# Patient Record
Sex: Female | Born: 1980
Health system: Southern US, Community
[De-identification: ages and names within clinical notes are randomized; demographics above are authoritative.]

## PROBLEM LIST (undated history)

## (undated) DIAGNOSIS — M4810 Ankylosing hyperostosis [Forestier], site unspecified: Secondary | ICD-10-CM

## (undated) DIAGNOSIS — Q828 Other specified congenital malformations of skin: Secondary | ICD-10-CM

## (undated) DIAGNOSIS — O24419 Gestational diabetes mellitus in pregnancy, unspecified control: Secondary | ICD-10-CM

## (undated) DIAGNOSIS — F419 Anxiety disorder, unspecified: Secondary | ICD-10-CM

## (undated) DIAGNOSIS — R5600 Simple febrile convulsions: Secondary | ICD-10-CM

## (undated) DIAGNOSIS — F32A Depression, unspecified: Secondary | ICD-10-CM

## (undated) DIAGNOSIS — N83209 Unspecified ovarian cyst, unspecified side: Secondary | ICD-10-CM

## (undated) DIAGNOSIS — B999 Unspecified infectious disease: Secondary | ICD-10-CM

## (undated) HISTORY — PX: WISDOM TOOTH EXTRACTION: SHX21

## (undated) HISTORY — PX: CHOLECYSTECTOMY: SHX55

## (undated) HISTORY — PX: DILATION AND CURETTAGE OF UTERUS: SHX78

## (undated) HISTORY — PX: OVARIAN CYST SURGERY: SHX726

## (undated) HISTORY — PX: TONSILLECTOMY: SUR1361

## (undated) HISTORY — DX: Depression, unspecified: F32.A

## (undated) HISTORY — PX: APPENDECTOMY: SHX54

---

## 2005-08-01 HISTORY — PX: TONSILLECTOMY: SUR1361

## 2008-09-16 DIAGNOSIS — F32A Depression, unspecified: Secondary | ICD-10-CM | POA: Insufficient documentation

## 2019-02-18 ENCOUNTER — Other Ambulatory Visit: Payer: Self-pay

## 2019-02-18 DIAGNOSIS — Z20822 Contact with and (suspected) exposure to covid-19: Secondary | ICD-10-CM

## 2019-02-18 NOTE — Addendum Note (Signed)
Addended by: Drucilla Schmidt on: 02/18/2019 08:30 AM   Modules accepted: Orders

## 2019-02-21 LAB — NOVEL CORONAVIRUS, NAA: SARS-CoV-2, NAA: NOT DETECTED

## 2019-02-26 ENCOUNTER — Other Ambulatory Visit: Payer: Self-pay

## 2019-02-26 DIAGNOSIS — Z20822 Contact with and (suspected) exposure to covid-19: Secondary | ICD-10-CM

## 2019-02-28 LAB — NOVEL CORONAVIRUS, NAA: SARS-CoV-2, NAA: NOT DETECTED

## 2019-07-24 ENCOUNTER — Encounter (HOSPITAL_COMMUNITY): Payer: Self-pay

## 2019-07-24 ENCOUNTER — Emergency Department (HOSPITAL_COMMUNITY): Payer: 59

## 2019-07-24 ENCOUNTER — Emergency Department (HOSPITAL_COMMUNITY)
Admission: EM | Admit: 2019-07-24 | Discharge: 2019-07-24 | Disposition: A | Discharge status: Home / Self Care | Payer: 59 | Attending: Emergency Medicine | Admitting: Emergency Medicine

## 2019-07-24 ENCOUNTER — Other Ambulatory Visit: Payer: Self-pay

## 2019-07-24 DIAGNOSIS — R102 Pelvic and perineal pain: Secondary | ICD-10-CM | POA: Insufficient documentation

## 2019-07-24 DIAGNOSIS — R1032 Left lower quadrant pain: Secondary | ICD-10-CM | POA: Insufficient documentation

## 2019-07-24 DIAGNOSIS — O99891 Other specified diseases and conditions complicating pregnancy: Secondary | ICD-10-CM | POA: Insufficient documentation

## 2019-07-24 DIAGNOSIS — Z3A00 Weeks of gestation of pregnancy not specified: Secondary | ICD-10-CM | POA: Diagnosis not present

## 2019-07-24 DIAGNOSIS — Z3A01 Less than 8 weeks gestation of pregnancy: Secondary | ICD-10-CM | POA: Diagnosis not present

## 2019-07-24 DIAGNOSIS — O26899 Other specified pregnancy related conditions, unspecified trimester: Secondary | ICD-10-CM | POA: Diagnosis not present

## 2019-07-24 DIAGNOSIS — O26891 Other specified pregnancy related conditions, first trimester: Secondary | ICD-10-CM | POA: Diagnosis not present

## 2019-07-24 LAB — CBC WITH DIFFERENTIAL/PLATELET
Abs Immature Granulocytes: 0.02 10*3/uL (ref 0.00–0.07)
Basophils Absolute: 0 10*3/uL (ref 0.0–0.1)
Basophils Relative: 1 %
Eosinophils Absolute: 0.3 10*3/uL (ref 0.0–0.5)
Eosinophils Relative: 4 %
HCT: 42.8 % (ref 36.0–46.0)
Hemoglobin: 14 g/dL (ref 12.0–15.0)
Immature Granulocytes: 0 %
Lymphocytes Relative: 24 %
Lymphs Abs: 1.6 10*3/uL (ref 0.7–4.0)
MCH: 30.2 pg (ref 26.0–34.0)
MCHC: 32.7 g/dL (ref 30.0–36.0)
MCV: 92.4 fL (ref 80.0–100.0)
Monocytes Absolute: 0.6 10*3/uL (ref 0.1–1.0)
Monocytes Relative: 9 %
Neutro Abs: 4.3 10*3/uL (ref 1.7–7.7)
Neutrophils Relative %: 62 %
Platelets: 243 10*3/uL (ref 150–400)
RBC: 4.63 MIL/uL (ref 3.87–5.11)
RDW: 12.1 % (ref 11.5–15.5)
WBC: 6.8 10*3/uL (ref 4.0–10.5)
nRBC: 0 % (ref 0.0–0.2)

## 2019-07-24 LAB — BASIC METABOLIC PANEL
Anion gap: 8 (ref 5–15)
BUN: 11 mg/dL (ref 6–20)
CO2: 23 mmol/L (ref 22–32)
Calcium: 8.8 mg/dL — ABNORMAL LOW (ref 8.9–10.3)
Chloride: 104 mmol/L (ref 98–111)
Creatinine, Ser: 0.76 mg/dL (ref 0.44–1.00)
GFR calc Af Amer: >60 mL/min (ref >60)
GFR calc non Af Amer: >60 mL/min (ref >60)
Glucose, Bld: 90 mg/dL (ref 70–99)
Potassium: 3.7 mmol/L (ref 3.5–5.1)
Sodium: 135 mmol/L (ref 135–145)

## 2019-07-24 LAB — ABO/RH: ABO/RH(D): O POS

## 2019-07-24 LAB — POC URINE PREG, ED: Preg Test, Ur: POSITIVE — AB

## 2019-07-24 LAB — HCG, QUANTITATIVE, PREGNANCY: hCG, Beta Chain, Quant, S: 105 m[IU]/mL — ABNORMAL HIGH (ref <5)

## 2019-07-24 NOTE — ED Provider Notes (Signed)
[facility] DEPT Provider Note   CSN: [id number] Arrival date & time: 07/24/19  [id number]     History Chief Complaint  Patient presents with  . Abdominal Cramping    Erin Dennis is a 38 y.o. female.  Erin Dennis is a 38 y.o. female with history of previous tubal pregnancy, who presents to the ED for evaluation of left lower abdominal cramping.  She reports symptoms started during the night and woke her up from sleep.  She reports that for a while they were constant and felt like a severe menstrual cramp.  She reports now cramping seems to have eased off, she called the [facility] nurse triage line and they recommended she come to the MAU ED for ultrasound given her history of tubal pregnancy.  She states that Sunday she had some light spotting, she has not had any bleeding since then, and yesterday took 2 home pregnancy test that were both positive.  She has not had any nausea or vomiting, no upper abdominal pain.  No abnormal bowel movements.  No urinary symptoms.  She has had no additional episodes of bleeding since light spotting on Sunday, last menstrual cycle was approximately 4 weeks ago.   The history is provided by the patient.       History reviewed. No pertinent past medical history.  There are no problems to display for this patient.      OB History   No obstetric history on file.     History reviewed. No pertinent family history.  Social History   Tobacco Use  . Smoking status: Not on file  Substance Use Topics  . Alcohol use: Not on file  . Drug use: Not on file    Home Medications Prior to Admission medications   Not on File    Allergies    Patient has no known allergies.  Review of Systems   Review of Systems  Constitutional: Negative for chills and fever.  Respiratory: Negative for cough and shortness of breath.   Cardiovascular: Negative for chest pain.  Gastrointestinal: Positive for abdominal pain. Negative for  diarrhea, nausea and vomiting.  Genitourinary: Positive for pelvic pain. Negative for dysuria, flank pain, frequency, vaginal bleeding and vaginal discharge.  Musculoskeletal: Negative for arthralgias.  All other systems reviewed and are negative.   Physical Exam Updated Vital Signs BP 124/88 (BP Location: Left Arm)   Pulse 68   Temp 98.2 F (36.8 C) (Oral)   Resp 16   Ht 5\' 1"  (1.549 m)   Wt 64.4 kg   SpO2 100%   BMI 26.83 kg/m   Physical Exam Vitals and nursing note reviewed.  Constitutional:      General: She is not in acute distress.    Appearance: Normal appearance. She is well-developed and normal weight. She is not ill-appearing or diaphoretic.  HENT:     Head: Normocephalic and atraumatic.  Eyes:     General:        Right eye: No discharge.        Left eye: No discharge.  Cardiovascular:     Rate and Rhythm: Normal rate and regular rhythm.     Heart sounds: Normal heart sounds. No murmur. No friction rub. No gallop.   Pulmonary:     Effort: Pulmonary effort is normal. No respiratory distress.     Breath sounds: Normal breath sounds. No wheezing or rales.     Comments: Respirations equal and unlabored, patient able to speak in full sentences,  lungs clear to auscultation bilaterally Abdominal:     General: Bowel sounds are normal. There is no distension.     Palpations: Abdomen is soft. There is no mass.     Tenderness: There is no abdominal tenderness. There is no guarding.     Comments: Abdomen soft, nondistended, nontender to palpation in all quadrants without guarding or peritoneal signs  Musculoskeletal:        General: No deformity.     Cervical back: Neck supple.  Skin:    General: Skin is warm and dry.     Capillary Refill: Capillary refill takes less than 2 seconds.  Neurological:     Mental Status: She is alert and oriented to person, place, and time.     Coordination: Coordination normal.     Comments: Speech is clear, able to follow commands Moves  extremities without ataxia, coordination intact  Psychiatric:        Mood and Affect: Mood normal.        Behavior: Behavior normal.     ED Results / Procedures / Treatments   Labs (all labs ordered are listed, but only abnormal results are displayed) Labs Reviewed  BASIC METABOLIC PANEL - Abnormal; Notable for the following components:      Result Value   Calcium 8.8 (*)    All other components within normal limits  HCG, QUANTITATIVE, PREGNANCY - Abnormal; Notable for the following components:   hCG, Beta Chain, Quant, S 105 (*)    All other components within normal limits  POC URINE PREG, ED - Abnormal; Notable for the following components:   Preg Test, Ur POSITIVE (*)    All other components within normal limits  CBC WITH DIFFERENTIAL/PLATELET  ABO/RH    EKG None  Radiology US OB LESS THAN 14 WEEKS WITH OB TRANSVAGINAL  Result Date: 07/24/2019 CLINICAL DATA:  Pelvic pain with positive beta HCG EXAM: OBSTETRIC <14 WK [place] AND TRANSVAGINAL OB US TECHNIQUE: Both transabdominal and transvaginal ultrasound examinations were performed for complete evaluation of the gestation as well as the maternal uterus, adnexal regions, and pelvic cul-de-sac. Transvaginal technique was performed to assess early pregnancy. COMPARISON:  None. FINDINGS: Intrauterine gestational sac: Not visualized Yolk sac:  Not visualized Embryo:  Not visualized Cardiac Activity: Not visualized Subchorionic hemorrhage:  None visualized. Maternal uterus/adnexae: Cervical os is closed. Endometrium measures 5 mm in thickness. Right ovary measures 2.7 x 1.4 x 1.9 cm. Left ovary measures 2.8 x 3.4 x 2.2 cm. There is a probable hemorrhagic corpus luteum on the left measuring 1.7 x 0.9 x 0.9 cm. There is trace free pelvic fluid. IMPRESSION: 1. No intrauterine mass or intrauterine gestation is evident currently. Given positive beta HCG, differential considerations must include intrauterine gestation too early to be seen by either  transabdominal transvaginal technique; recent spontaneous abortion; ectopic gestation. Close clinical and laboratory evaluation in this regard advised. Timing of repeat ultrasound in part will depend on beta HCG values going forward. 2.  Probable hemorrhagic corpus luteum left ovary. 3. Trace free pelvic fluid may be physiologic or could be due to recent ovarian cyst/corpus luteum rupture. Electronically Signed   By: Lowella Grip III M.D.   On: 07/24/2019 08:31    Procedures Procedures (including critical care time)  Medications Ordered in ED Medications - No data to display  ED Course  I have reviewed the triage vital signs and the nursing notes.  Pertinent labs & imaging results that were available during my care of the patient  were reviewed by me and considered in my medical decision making (see chart for details).  Clinical Course as of Jul 23 854  Wed Jul 24, 2019  0715 Stable hemoglobin and no leukocytosis  CBC with Differential [KF]  0715 No acute electrolyte derangements  Basic metabolic panel(!) [KF]  [id number] Urine pregnancy positive as expected, HCG quant pending  POC urine preg, ED(!) [KF]  0745 Rh+, patient will not need RhoGam  ABO/Rh [KF]  0837 Ultrasound with no intrauterine mass or gestation evident, given positive hCG differential includes intrauterine gestation too early to be seen, recent spontaneous abortion, or ectopic gestation.  Close follow-up of hCG levels is recommended, there is a probable hemorrhagic corpus luteal cyst on the left ovary and trace free fluid noted.  US OB LESS THAN 14 WEEKS WITH OB TRANSVAGINAL [KF]  [phone] Case discussed with on-call OB/GYN physician who recommends follow-up MAU in 2 days for repeat hCG level   [KF]    Clinical Course User Index [KF] Janet Berlin   MDM Rules/Calculators/A&P                      38 year old female with history of previous tubal pregnancy presents approximately [redacted] weeks pregnant with left-sided  abdominal cramping, had some spotting 3 days ago.  hCG positive at 105, ultrasound without evidence of intrauterine gestation, no ectopic abnormality noted, differential includes intrauterine gestation too early to see on ultrasound, versus ectopic pregnancy or spontaneous abortion.  Cramping has resolved with no further pain while patient has been here in the ED.  Discussed with on-call OB/GYN, will have patient follow-up in the MAU in 2 days for repeat hCG level, strict return precautions discussed.  Discharged home in good condition.  Final Clinical Impression(s) / ED Diagnoses Final diagnoses:  Less than [redacted] weeks gestation of pregnancy  Abdominal cramping in left lower quadrant    Rx / DC Orders ED Discharge Orders    None       Janet Berlin 07/24/19 0910    Quintella Reichert, MD 07/24/19 [phone]

## 2019-07-24 NOTE — ED Triage Notes (Signed)
Pt presents with cramping after 2 positive pregnancy tests at home. She as a hx of tubal pregnancy. Denies vaginal bleeding, but had some spotting on Sunday. She was sent for a vaginal [place].

## 2019-07-24 NOTE — Discharge Instructions (Addendum)
Your hCG level was 105 today, and your ultrasound did not show an intrauterine pregnancy this can be because it is too early in the pregnancy to see on ultrasound, or it could be an ectopic pregnancy or miscarriage.  You will need repeat hCG levels to help determine which of these scenarios.  Go to the MAU on December 25 for repeat hCG level.  Follow-up sooner if you have significantly worsened pain or vaginal bleeding.

## 2019-07-26 ENCOUNTER — Encounter (HOSPITAL_COMMUNITY): Payer: Self-pay | Admitting: *Deleted

## 2019-07-26 ENCOUNTER — Inpatient Hospital Stay (HOSPITAL_COMMUNITY)
Admission: AD | Admit: 2019-07-26 | Discharge: 2019-07-26 | Disposition: A | Discharge status: Home / Self Care | Payer: 59 | Attending: Family Medicine | Admitting: Family Medicine

## 2019-07-26 DIAGNOSIS — O3680X0 Pregnancy with inconclusive fetal viability, not applicable or unspecified: Secondary | ICD-10-CM | POA: Diagnosis not present

## 2019-07-26 DIAGNOSIS — Z3A01 Less than 8 weeks gestation of pregnancy: Secondary | ICD-10-CM | POA: Diagnosis not present

## 2019-07-26 DIAGNOSIS — Z6790 Unspecified blood type, Rh positive: Secondary | ICD-10-CM | POA: Insufficient documentation

## 2019-07-26 DIAGNOSIS — O26891 Other specified pregnancy related conditions, first trimester: Secondary | ICD-10-CM | POA: Insufficient documentation

## 2019-07-26 LAB — HCG, QUANTITATIVE, PREGNANCY: hCG, Beta Chain, Quant, S: 240 m[IU]/mL — ABNORMAL HIGH (ref <5)

## 2019-07-26 NOTE — MAU Provider Note (Signed)
Subjective:  Erin Dennis is a 38 y.o. G1P0 at [redacted]w[redacted]d who presents today for FU BHCG. She was seen on 07/24/2019 @ [facility]. Results from that day show no IUP on [place], ABO O+ and HCG 105. She denies vaginal bleeding. She denies abdominal or pelvic pain.  Objective:  Physical Exam  Nursing note and vitals reviewed. Patient Vitals for the past 24 hrs:  BP Temp Temp src Pulse Resp SpO2 Height Weight  07/26/19 1713 (!) 131/91 98.3 F (36.8 C) Oral 66 18 100 % 5\' 1"  (1.549 m) 63.5 kg   Constitutional: She is oriented to person, place, and time. She appears well-developed and well-nourished. No distress.  HENT:  Head: Normocephalic.  Cardiovascular: Normal rate.  Respiratory: Effort normal.  GI: Soft. There is no tenderness.  Neurological: She is alert and oriented to person, place, and time. Skin: Skin is warm and dry.  Psychiatric: She has a normal mood and affect.   Results for orders placed or performed during the hospital encounter of 07/26/19 (from the past 24 hour(s))  hCG, quantitative, pregnancy     Status: Abnormal   Collection Time: 07/26/19  5:07 PM  Result Value Ref Range   hCG, Beta Chain, Quant, S 240 (H) <5 mIU/mL   Assessment/Plan: Pregnancy of unknown location HCG did  rise appropriately FU in 2 days for: repeat hCG in MAU @5PM  (pt unable to make Monday AM appointment at [facility]) Pt desires list of area OBs if hCG rises appropriately on Sunday Pt discharged to home in stable condition

## 2019-07-26 NOTE — MAU Note (Signed)
Feeling fine, no pain or bleeding.  Had some spotting on Sunday. Tues night had period like cramps in lower abd.

## 2019-07-26 NOTE — Discharge Instructions (Signed)
Ectopic Pregnancy ° °An ectopic pregnancy is when the fertilized egg attaches (implants) outside the uterus. Most ectopic pregnancies occur in one of the tubes where eggs travel from the ovary to the uterus (fallopian tubes), but the implanting can occur in other locations. In rare cases, ectopic pregnancies occur on the ovary, intestine, pelvis, abdomen, or cervix. In an ectopic pregnancy, the fertilized egg does not have the ability to develop into a normal, healthy baby. °A ruptured ectopic pregnancy is one in which tearing or bursting of a fallopian tube causes internal bleeding. Often, there is intense lower abdominal pain, and vaginal bleeding sometimes occurs. Having an ectopic pregnancy can be life-threatening. If this dangerous condition is not treated, it can lead to blood loss, shock, or even death. °What are the causes? °The most common cause of this condition is damage to one of the fallopian tubes. A fallopian tube may be narrowed or blocked, and that keeps the fertilized egg from reaching the uterus. °What increases the risk? °This condition is more likely to develop in women of childbearing age who have different levels of risk. The levels of risk can be divided into three categories. °High risk °· You have gone through infertility treatment. °· You have had an ectopic pregnancy before. °· You have had surgery on the fallopian tubes, or another surgical procedure, such as an abortion. °· You have had surgery to have the fallopian tubes tied (tubal ligation). °· You have problems or diseases of the fallopian tubes. °· You have been exposed to diethylstilbestrol (DES). This medicine was used until 1971, and it had effects on babies whose mothers took the medicine. °· You become pregnant while using an IUD (intrauterine device) for birth control. °Moderate risk °· You have a history of infertility. °· You have had an STI (sexually transmitted infection). °· You have a history of pelvic inflammatory  disease (PID). °· You have scarring from endometriosis. °· You have multiple sexual partners. °· You smoke. °Low risk °· You have had pelvic surgery. °· You use vaginal douches. °· You became sexually active before age 18. °What are the signs or symptoms? °Common symptoms of this condition include normal pregnancy symptoms, such as missing a period, nausea, tiredness, abdominal pain, breast tenderness, and bleeding. However, ectopic pregnancy will have additional symptoms, such as: °· Pain with intercourse. °· Irregular vaginal bleeding or spotting. °· Cramping or pain on one side or in the lower abdomen. °· Fast heartbeat, low blood pressure, and sweating. °· Passing out while having a bowel movement. °Symptoms of a ruptured ectopic pregnancy and internal bleeding may include: °· Sudden, severe pain in the abdomen and pelvis. °· Dizziness, weakness, light-headedness, or fainting. °· Pain in the shoulder or neck area. °How is this diagnosed? °This condition is diagnosed by: °· A pelvic exam to locate pain or a mass in the abdomen. °· A pregnancy test. This blood test checks for the presence as well as the specific level of pregnancy hormone in the bloodstream. °· Ultrasound. This is performed if a pregnancy test is positive. In this test, a probe is inserted into the vagina. The probe will detect a fetus, possibly in a location other than the uterus. °· Taking a sample of uterus tissue (dilation and curettage, or D&C). °· Surgery to perform a visual exam of the inside of the abdomen using a thin, lighted tube that has a tiny camera on the end (laparoscope). °· Culdocentesis. This procedure involves inserting a needle at the top of   the vagina, behind the uterus. If blood is present in this area, it may indicate that a fallopian tube is torn. How is this treated? This condition is treated with medicine or surgery. Medicine  An injection of a medicine (methotrexate) may be given to cause the pregnancy tissue to be  absorbed. This medicine may save your fallopian tube. It may be given if: ? The diagnosis is made early, with no signs of active bleeding. ? The fallopian tube has not ruptured. ? You are considered to be a good candidate for the medicine. Usually, pregnancy hormone blood levels are checked after methotrexate treatment. This is to be sure that the medicine is effective. It may take 4-6 weeks for the pregnancy to be absorbed. Most pregnancies will be absorbed by 3 weeks. Surgery  A laparoscope may be used to remove the pregnancy tissue.  If severe internal bleeding occurs, a larger cut (incision) may be made in the lower abdomen (laparotomy) to remove the fetus and placenta. This is done to stop the bleeding.  Part or all of the fallopian tube may be removed (salpingectomy) along with the fetus and placenta. The fallopian tube may also be repaired during the surgery.  In very rare circumstances, removal of the uterus (hysterectomy) may be required.  After surgery, pregnancy hormone testing may be done to be sure that there is no pregnancy tissue left. Whether your treatment is medicine or surgery, you may receive a Rho (D) immune globulin shot to prevent problems with any future pregnancy. This shot may be given if:  You are Rh-negative and the baby's father is Rh-positive.  You are Rh-negative and you do not know the Rh type of the baby's father. Follow these instructions at home:  Rest and limit your activity after the procedure for as long as told by your health care provider.  Until your health care provider says that it is safe: ? Do not lift anything that is heavier than 10 lb (4.5 kg), or the limit that your health care provider tells you. ? Avoid physical exercise and any movement that requires effort (is strenuous).  To help prevent constipation: ? Eat a healthy diet that includes fruits, vegetables, and whole grains. ? Drink 6-8 glasses of water per day. Get help right away  if:  You develop worsening pain that is not relieved by medicine.  You have: ? A fever or chills. ? Vaginal bleeding. ? Redness and swelling at the incision site. ? Nausea and vomiting.  You feel dizzy or weak.  You feel light-headed or you faint. This information is not intended to replace advice given to you by your health care provider. Make sure you discuss any questions you have with your health care provider. Document Released: 08/25/2004 Document Revised: 06/30/2017 Document Reviewed: 02/17/2016 Elsevier Patient Education  [place].

## 2019-07-28 ENCOUNTER — Other Ambulatory Visit: Payer: Self-pay

## 2019-07-28 ENCOUNTER — Encounter (HOSPITAL_COMMUNITY): Payer: Self-pay | Admitting: Family Medicine

## 2019-07-28 ENCOUNTER — Inpatient Hospital Stay (HOSPITAL_COMMUNITY)
Admission: AD | Admit: 2019-07-28 | Discharge: 2019-07-28 | Disposition: A | Discharge status: Home / Self Care | Payer: 59 | Attending: Family Medicine | Admitting: Family Medicine

## 2019-07-28 DIAGNOSIS — Z3A01 Less than 8 weeks gestation of pregnancy: Secondary | ICD-10-CM | POA: Diagnosis not present

## 2019-07-28 DIAGNOSIS — O3680X0 Pregnancy with inconclusive fetal viability, not applicable or unspecified: Secondary | ICD-10-CM

## 2019-07-28 HISTORY — DX: Other specified congenital malformations of skin: Q82.8

## 2019-07-28 LAB — HCG, QUANTITATIVE, PREGNANCY: hCG, Beta Chain, Quant, S: 451 m[IU]/mL — ABNORMAL HIGH (ref <5)

## 2019-07-28 NOTE — Discharge Instructions (Signed)
Return to care   If you have heavier bleeding that soaks through more that 2 pads per hour for an hour or more  If you bleed so much that you feel like you might pass out or you do pass out  If you have significant abdominal pain that is not improved with Tylenol   If you develop a fever > 100.5     [place] [facility] at [facility]       Phone: [place] [facility] [place] [facility] [place] Phone: [place] [facility] [place] [facility] [place]  Phone: [place] [facility] [place] at [facility]  Phone: [place] [facility] [place] [facility] [place]  Phone: [facility]       Phone: [phone]  [facility]    Phone: [phone]   [facility] [place])    Phone: [facility]    Phone: [phone]  [facility]    Phone: [phone]   [place] [facility]  Phone: [place]    Phone: [phone]  [facility]   Phone: [phone]  [facility]    Phone: [phone]

## 2019-07-28 NOTE — MAU Note (Signed)
Erin Dennis is a 38 y.o. at [redacted]w[redacted]d here in MAU reporting:  For follow up blood work. Pain score: denies Denies vaginal bleeding. Vitals:   07/28/19 1742  BP: 118/72  Pulse: 69  Resp: 18  Temp: 98.5 F (36.9 C)  SpO2: 100%

## 2019-07-28 NOTE — MAU Provider Note (Signed)
Erin Dennis  is a 38 y.o. G1P0  at [redacted]w[redacted]d who presents to MAU today for follow-up quant hCG. The patient denies abdominal pain, vaginal bleeding, N/V or fever.   BP 118/72 (BP Location: Right Arm)   Pulse 69   Temp 98.5 F (36.9 C) (Oral)   Resp 18   LMP 06/27/2019   SpO2 100% Comment: ra  GENERAL: Well-developed, well-nourished female in no acute distress.  HEENT: Normocephalic, atraumatic.   LUNGS: Effort normal HEART: Regular rate  SKIN: Warm, dry and without erythema PSYCH: Normal mood and affect   A: Appropriate rise in quant hCG after 48 hours Component     Latest Ref Rng & Units 07/24/2019 07/26/2019 07/28/2019  HCG, Beta Chain, Quant, S     <5 mIU/mL 105 (H) 240 (H) 451 (H)    P: Discharge home Bleeding/Ectopic precautions discussed Message to [facility] Elgie Collard) for viability ultrasound and ob appt Patient may return to MAU as needed or if her condition were to change or worsen   Jorje Guild, NP  07/28/2019 5:44 PM

## 2019-07-31 ENCOUNTER — Inpatient Hospital Stay (HOSPITAL_COMMUNITY): Payer: 59

## 2019-07-31 ENCOUNTER — Inpatient Hospital Stay (HOSPITAL_COMMUNITY)
Admission: AD | Admit: 2019-07-31 | Discharge: 2019-07-31 | Disposition: A | Discharge status: Home / Self Care | Payer: 59 | Attending: Obstetrics and Gynecology | Admitting: Obstetrics and Gynecology

## 2019-07-31 ENCOUNTER — Other Ambulatory Visit: Payer: Self-pay

## 2019-07-31 DIAGNOSIS — R109 Unspecified abdominal pain: Secondary | ICD-10-CM | POA: Insufficient documentation

## 2019-07-31 DIAGNOSIS — O26891 Other specified pregnancy related conditions, first trimester: Secondary | ICD-10-CM | POA: Diagnosis not present

## 2019-07-31 DIAGNOSIS — O26899 Other specified pregnancy related conditions, unspecified trimester: Secondary | ICD-10-CM

## 2019-07-31 DIAGNOSIS — O09521 Supervision of elderly multigravida, first trimester: Secondary | ICD-10-CM | POA: Diagnosis not present

## 2019-07-31 DIAGNOSIS — Z3A01 Less than 8 weeks gestation of pregnancy: Secondary | ICD-10-CM | POA: Diagnosis not present

## 2019-07-31 DIAGNOSIS — O3680X0 Pregnancy with inconclusive fetal viability, not applicable or unspecified: Secondary | ICD-10-CM | POA: Insufficient documentation

## 2019-07-31 LAB — HCG, QUANTITATIVE, PREGNANCY: hCG, Beta Chain, Quant, S: 1965 m[IU]/mL — ABNORMAL HIGH (ref <5)

## 2019-07-31 NOTE — MAU Provider Note (Signed)
Chief Complaint: Labs Only   None     approp rise in hcg on 12/27, now mid abdom pain, mild intermittent  SUBJECTIVE HPI: Erin Dennis is a 38 y.o. G2P0010 at [redacted]w[redacted]d by LMP who presents to maternity admissions reporting mid abdominal pain that is mild but she is worried. With her hx of ectopic she does not want to ignore abdominal pain. The pain is mild intermittent cramping pain at her umbilicus and just below, no pain in her pelvis like with her ectopic.  She denies any other symptoms. She has not tried any treatments.  Her hcg has risen appropriately on previous labs:   Ref Range & Units 18:03 3 d ago 5 d ago 7 d ago  hCG, Beta Chain, Quant, S <5 mIU/mL 1,965High   451High  CM  240High  CM  105High  CM   Comment:           HPI  Past Medical History:  Diagnosis Date  . Darier disease    Past Surgical History:  Procedure Laterality Date  . APPENDECTOMY    . CHOLECYSTECTOMY    . DILATION AND CURETTAGE OF UTERUS     for menorrhagia  . OVARIAN CYST SURGERY     x3  . TONSILLECTOMY    . WISDOM TOOTH EXTRACTION     Social History   Socioeconomic History  . Marital status: Married    Spouse name: Not on file  . Number of children: Not on file  . Years of education: Not on file  . Highest education level: Not on file  Occupational History  . Not on file  Tobacco Use  . Smoking status: Not on file  Substance and Sexual Activity  . Alcohol use: Not on file  . Drug use: Not on file  . Sexual activity: Not on file  Other Topics Concern  . Not on file  Social History Narrative  . Not on file   Social Determinants of Health   Financial Resource Strain:   . Difficulty of Paying Living Expenses: Not on file  Food Insecurity:   . Worried About Charity fundraiser in the Last Year: Not on file  . Ran Out of Food in the Last Year: Not on file  Transportation Needs:   . Lack of Transportation (Medical): Not on file  . Lack of Transportation (Non-Medical): Not on file   Physical Activity:   . Days of Exercise per Week: Not on file  . Minutes of Exercise per Session: Not on file  Stress:   . Feeling of Stress : Not on file  Social Connections:   . Frequency of Communication with Friends and Family: Not on file  . Frequency of Social Gatherings with Friends and Family: Not on file  . Attends Religious Services: Not on file  . Active Member of Clubs or Organizations: Not on file  . Attends Archivist Meetings: Not on file  . Marital Status: Not on file  Intimate Partner Violence:   . Fear of Current or Ex-Partner: Not on file  . Emotionally Abused: Not on file  . Physically Abused: Not on file  . Sexually Abused: Not on file   No current facility-administered medications on file prior to encounter.   No current outpatient medications on file prior to encounter.   No Known Allergies  ROS:  Review of Systems  Constitutional: Negative for chills, fatigue and fever.  Respiratory: Negative for shortness of breath.   Cardiovascular: Negative  for chest pain.  Gastrointestinal: Positive for abdominal pain. Negative for nausea and vomiting.  Genitourinary: Negative for difficulty urinating, dysuria, flank pain, pelvic pain, vaginal bleeding, vaginal discharge and vaginal pain.  Neurological: Negative for dizziness and headaches.  Psychiatric/Behavioral: Negative.      I have reviewed patient's Past Medical Hx, Surgical Hx, Family Hx, Social Hx, medications and allergies.   Physical Exam   Patient Vitals for the past 24 hrs:  BP Temp Pulse Resp SpO2  07/31/19 1810 132/82 98.2 F (36.8 C) 78 16 100 %   Constitutional: Well-developed, well-nourished female in no acute distress.  Cardiovascular: normal rate Respiratory: normal effort GI: Abd soft, non-tender. Pos BS x 4 MS: Extremities nontender, no edema, normal ROM Neurologic: Alert and oriented x 4.  GU: Neg CVAT.   LAB RESULTS Results for orders placed or performed during the  hospital encounter of 07/31/19 (from the past 24 hour(s))  hCG, quantitative, pregnancy     Status: Abnormal   Collection Time: 07/31/19  6:03 PM  Result Value Ref Range   hCG, Beta Chain, Quant, S 1,965 (H) <5 mIU/mL    --/--/O POS (12/23 [id number])  IMAGING  Preliminary report for [place] on 12/30 indicates possible gestational sac, no yolk sac or fetal pole identified.  US OB LESS THAN 14 WEEKS WITH OB TRANSVAGINAL  Result Date: 07/24/2019 CLINICAL DATA:  Pelvic pain with positive beta HCG EXAM: OBSTETRIC <14 WK [place] AND TRANSVAGINAL OB US TECHNIQUE: Both transabdominal and transvaginal ultrasound examinations were performed for complete evaluation of the gestation as well as the maternal uterus, adnexal regions, and pelvic cul-de-sac. Transvaginal technique was performed to assess early pregnancy. COMPARISON:  None. FINDINGS: Intrauterine gestational sac: Not visualized Yolk sac:  Not visualized Embryo:  Not visualized Cardiac Activity: Not visualized Subchorionic hemorrhage:  None visualized. Maternal uterus/adnexae: Cervical os is closed. Endometrium measures 5 mm in thickness. Right ovary measures 2.7 x 1.4 x 1.9 cm. Left ovary measures 2.8 x 3.4 x 2.2 cm. There is a probable hemorrhagic corpus luteum on the left measuring 1.7 x 0.9 x 0.9 cm. There is trace free pelvic fluid. IMPRESSION: 1. No intrauterine mass or intrauterine gestation is evident currently. Given positive beta HCG, differential considerations must include intrauterine gestation too early to be seen by either transabdominal transvaginal technique; recent spontaneous abortion; ectopic gestation. Close clinical and laboratory evaluation in this regard advised. Timing of repeat ultrasound in part will depend on beta HCG values going forward. 2.  Probable hemorrhagic corpus luteum left ovary. 3. Trace free pelvic fluid may be physiologic or could be due to recent ovarian cyst/corpus luteum rupture. Electronically Signed   By: Lowella Grip III M.D.   On: 07/24/2019 08:31    MAU Management/MDM: Orders Placed This Encounter  Procedures  . US OB Transvaginal  . hCG, quantitative, pregnancy  . Discharge patient    No orders of the defined types were placed in this encounter.   Appropriate rise in hcg continues. [place] without evidence of IUP or ectopic.  Pain has resolved while pt waiting for US/lab results.  With minimal pain, pt to follow up at [facility] as scheduled for viability [place] on 08/18/18.  If pain becomes more severe, pt to return to MAU. Pt discharged with strict ectopic precautions.  ASSESSMENT 1. Pregnancy of unknown anatomic location   2. Abdominal pain during pregnancy in first trimester   3. Abdominal pain affecting pregnancy     PLAN Discharge home Allergies as of 07/31/2019  No Known Allergies     Medication List    You have not been prescribed any medications.    Follow-up Information    [facility] Follow up.   Why: As scheduled 08/19/19, or return to MAU with worsening pain or emergencies. Contact information: [place] [id number] [place] Certified Nurse-Midwife 07/31/2019  7:21 PM

## 2019-07-31 NOTE — MAU Note (Signed)
.   Erin Dennis is a 38 y.o. at [redacted]w[redacted]d here in MAU reporting: she has pain in her mid abdomen. Denies any vaginal bleeding .   Onset of complaint: Pain score: 4 Vitals:   07/31/19 1810  BP: 132/82  Pulse: 78  Resp: 16  Temp: 98.2 F (36.8 C)  SpO2: 100%     FHT: Lab orders placed from triage: BHCG

## 2019-08-05 ENCOUNTER — Inpatient Hospital Stay (HOSPITAL_COMMUNITY): Payer: 59

## 2019-08-05 ENCOUNTER — Inpatient Hospital Stay (HOSPITAL_COMMUNITY)
Admission: AD | Admit: 2019-08-05 | Discharge: 2019-08-05 | Disposition: A | Discharge status: Home / Self Care | Payer: 59 | Attending: Family Medicine | Admitting: Family Medicine

## 2019-08-05 DIAGNOSIS — Z3491 Encounter for supervision of normal pregnancy, unspecified, first trimester: Secondary | ICD-10-CM

## 2019-08-05 DIAGNOSIS — O26891 Other specified pregnancy related conditions, first trimester: Secondary | ICD-10-CM | POA: Insufficient documentation

## 2019-08-05 DIAGNOSIS — R109 Unspecified abdominal pain: Secondary | ICD-10-CM | POA: Insufficient documentation

## 2019-08-05 DIAGNOSIS — O09521 Supervision of elderly multigravida, first trimester: Secondary | ICD-10-CM | POA: Diagnosis not present

## 2019-08-05 DIAGNOSIS — Z3A01 Less than 8 weeks gestation of pregnancy: Secondary | ICD-10-CM | POA: Diagnosis not present

## 2019-08-05 DIAGNOSIS — O208 Other hemorrhage in early pregnancy: Secondary | ICD-10-CM | POA: Diagnosis not present

## 2019-08-05 DIAGNOSIS — O26899 Other specified pregnancy related conditions, unspecified trimester: Secondary | ICD-10-CM

## 2019-08-05 LAB — URINALYSIS, ROUTINE W REFLEX MICROSCOPIC
Bilirubin Urine: NEGATIVE
Glucose, UA: NEGATIVE mg/dL
Hgb urine dipstick: NEGATIVE
Ketones, ur: 20 mg/dL — AB
Leukocytes,Ua: NEGATIVE
Nitrite: NEGATIVE
Protein, ur: NEGATIVE mg/dL
Specific Gravity, Urine: 1.02 (ref 1.005–1.030)
pH: 7 (ref 5.0–8.0)

## 2019-08-05 LAB — COMPREHENSIVE METABOLIC PANEL
ALT: 17 U/L (ref 0–44)
AST: 20 U/L (ref 15–41)
Albumin: 3.8 g/dL (ref 3.5–5.0)
Alkaline Phosphatase: 44 U/L (ref 38–126)
Anion gap: 8 (ref 5–15)
BUN: 8 mg/dL (ref 6–20)
CO2: 26 mmol/L (ref 22–32)
Calcium: 8.9 mg/dL (ref 8.9–10.3)
Chloride: 100 mmol/L (ref 98–111)
Creatinine, Ser: 0.89 mg/dL (ref 0.44–1.00)
GFR calc Af Amer: >60 mL/min (ref >60)
GFR calc non Af Amer: >60 mL/min (ref >60)
Glucose, Bld: 107 mg/dL — ABNORMAL HIGH (ref 70–99)
Potassium: 4.4 mmol/L (ref 3.5–5.1)
Sodium: 134 mmol/L — ABNORMAL LOW (ref 135–145)
Total Bilirubin: <0.1 mg/dL — ABNORMAL LOW (ref 0.3–1.2)
Total Protein: 6.4 g/dL — ABNORMAL LOW (ref 6.5–8.1)

## 2019-08-05 LAB — CBC WITH DIFFERENTIAL/PLATELET
Abs Immature Granulocytes: 0.03 10*3/uL (ref 0.00–0.07)
Basophils Absolute: 0.1 10*3/uL (ref 0.0–0.1)
Basophils Relative: 1 %
Eosinophils Absolute: 0.2 10*3/uL (ref 0.0–0.5)
Eosinophils Relative: 2 %
HCT: 38.2 % (ref 36.0–46.0)
Hemoglobin: 12.6 g/dL (ref 12.0–15.0)
Immature Granulocytes: 0 %
Lymphocytes Relative: 21 %
Lymphs Abs: 1.9 10*3/uL (ref 0.7–4.0)
MCH: 30.3 pg (ref 26.0–34.0)
MCHC: 33 g/dL (ref 30.0–36.0)
MCV: 91.8 fL (ref 80.0–100.0)
Monocytes Absolute: 0.7 10*3/uL (ref 0.1–1.0)
Monocytes Relative: 8 %
Neutro Abs: 6.4 10*3/uL (ref 1.7–7.7)
Neutrophils Relative %: 68 %
Platelets: 247 10*3/uL (ref 150–400)
RBC: 4.16 MIL/uL (ref 3.87–5.11)
RDW: 12.2 % (ref 11.5–15.5)
WBC: 9.2 10*3/uL (ref 4.0–10.5)
nRBC: 0 % (ref 0.0–0.2)

## 2019-08-05 LAB — HCG, QUANTITATIVE, PREGNANCY: hCG, Beta Chain, Quant, S: 9229 m[IU]/mL — ABNORMAL HIGH (ref <5)

## 2019-08-05 NOTE — MAU Note (Signed)
.   Erin Dennis is a 39 y.o. at [redacted]w[redacted]d here in MAU reporting: abdominal cramping on and off. Denies any VB LMP: 06/27/19 Onset of complaint: ongoing Pain score: 4 Vitals:   08/05/19 1440  BP: 108/63  Pulse: 84  Resp: 18  Temp: 98.2 F (36.8 C)  SpO2: 100%     FHT: Lab orders placed from triage:

## 2019-08-05 NOTE — MAU Provider Note (Signed)
S: 39 y.o. G2P0010 @[redacted]w[redacted]d  by LMP presents to MAU for abdominal pain.  She reports abdominal pain started occurring yesterday after intercourse - describes abdominal pain as "menstrual cramps" that are intermittent. She reports she holds pressure on her lower abdomen and the pain resolves after 5-10 minutes.   She has had a series of follow up quants for pregnancy of unknown location. Last one being on 12/30 which was 1,965 and US showed probable early gestational sac.   She denies vaginal bleeding or vaginal discharge - plans to receive prenatal [facility] at [facility].   HPI  O: BP 108/63   Pulse 84   Temp 98.2 F (36.8 C)   Resp 18   LMP 06/27/2019   SpO2 100%   VS reviewed, nursing note reviewed,  Constitutional: well developed, well nourished, no distress HEENT: normocephalic CV: normal rate Pulm/chest wall: normal effort Abdomen: soft Neuro: alert and oriented x 3 Skin: warm, dry Psych: affect normal  Results for orders placed or performed during the hospital encounter of 08/05/19 (from the past 24 hour(s))  Urinalysis, Routine w reflex microscopic     Status: Abnormal   Collection Time: 08/05/19  2:49 PM  Result Value Ref Range   Color, Urine YELLOW YELLOW   APPearance HAZY (A) CLEAR   Specific Gravity, Urine 1.020 1.005 - 1.030   pH 7.0 5.0 - 8.0   Glucose, UA NEGATIVE NEGATIVE mg/dL   Hgb urine dipstick NEGATIVE NEGATIVE   Bilirubin Urine NEGATIVE NEGATIVE   Ketones, ur 20 (A) NEGATIVE mg/dL   Protein, ur NEGATIVE NEGATIVE mg/dL   Nitrite NEGATIVE NEGATIVE   Leukocytes,Ua NEGATIVE NEGATIVE  hCG, quantitative, pregnancy     Status: Abnormal   Collection Time: 08/05/19  3:00 PM  Result Value Ref Range   hCG, Beta Chain, Quant, S 9,229 (H) <5 mIU/mL  Comprehensive metabolic panel     Status: Abnormal   Collection Time: 08/05/19  3:00 PM  Result Value Ref Range   Sodium 134 (L) 135 - 145 mmol/L   Potassium 4.4 3.5 - 5.1 mmol/L   Chloride 100 98 - 111 mmol/L   CO2 26 22 - 32  mmol/L   Glucose, Bld 107 (H) 70 - 99 mg/dL   BUN 8 6 - 20 mg/dL   Creatinine, Ser 0.89 0.44 - 1.00 mg/dL   Calcium 8.9 8.9 - 10.3 mg/dL   Total Protein 6.4 (L) 6.5 - 8.1 g/dL   Albumin 3.8 3.5 - 5.0 g/dL   AST 20 15 - 41 U/L   ALT 17 0 - 44 U/L   Alkaline Phosphatase 44 38 - 126 U/L   Total Bilirubin <0.1 (L) 0.3 - 1.2 mg/dL   GFR calc non Af Amer >60 >60 mL/min   GFR calc Af Amer >60 >60 mL/min   Anion gap 8 5 - 15  CBC with Differential     Status: None   Collection Time: 08/05/19  3:00 PM  Result Value Ref Range   WBC 9.2 4.0 - 10.5 K/uL   RBC 4.16 3.87 - 5.11 MIL/uL   Hemoglobin 12.6 12.0 - 15.0 g/dL   HCT 38.2 36.0 - 46.0 %   MCV 91.8 80.0 - 100.0 fL   MCH 30.3 26.0 - 34.0 pg   MCHC 33.0 30.0 - 36.0 g/dL   RDW 12.2 11.5 - 15.5 %   Platelets 247 150 - 400 K/uL   nRBC 0.0 0.0 - 0.2 %   Neutrophils Relative % 68 %   Neutro  Abs 6.4 1.7 - 7.7 K/uL   Lymphocytes Relative 21 %   Lymphs Abs 1.9 0.7 - 4.0 K/uL   Monocytes Relative 8 %   Monocytes Absolute 0.7 0.1 - 1.0 K/uL   Eosinophils Relative 2 %   Eosinophils Absolute 0.2 0.0 - 0.5 K/uL   Basophils Relative 1 %   Basophils Absolute 0.1 0.0 - 0.1 K/uL   Immature Granulocytes 0 %   Abs Immature Granulocytes 0.03 0.00 - 0.07 K/uL    --/--/O POS (12/23 [id number])  MDM: Ordered labs/reviewed results. Quant hcg rose appropriately. [place] ordered after return of HCG.  Labs and [place] report reviewed:  Results for orders placed or performed during the hospital encounter of 08/05/19 (from the past 24 hour(s))  Urinalysis, Routine w reflex microscopic     Status: Abnormal   Collection Time: 08/05/19  2:49 PM  Result Value Ref Range   Color, Urine YELLOW YELLOW   APPearance HAZY (A) CLEAR   Specific Gravity, Urine 1.020 1.005 - 1.030   pH 7.0 5.0 - 8.0   Glucose, UA NEGATIVE NEGATIVE mg/dL   Hgb urine dipstick NEGATIVE NEGATIVE   Bilirubin Urine NEGATIVE NEGATIVE   Ketones, ur 20 (A) NEGATIVE mg/dL   Protein, ur NEGATIVE NEGATIVE  mg/dL   Nitrite NEGATIVE NEGATIVE   Leukocytes,Ua NEGATIVE NEGATIVE  hCG, quantitative, pregnancy     Status: Abnormal   Collection Time: 08/05/19  3:00 PM  Result Value Ref Range   hCG, Beta Chain, Quant, S 9,229 (H) <5 mIU/mL  Comprehensive metabolic panel     Status: Abnormal   Collection Time: 08/05/19  3:00 PM  Result Value Ref Range   Sodium 134 (L) 135 - 145 mmol/L   Potassium 4.4 3.5 - 5.1 mmol/L   Chloride 100 98 - 111 mmol/L   CO2 26 22 - 32 mmol/L   Glucose, Bld 107 (H) 70 - 99 mg/dL   BUN 8 6 - 20 mg/dL   Creatinine, Ser 0.89 0.44 - 1.00 mg/dL   Calcium 8.9 8.9 - 10.3 mg/dL   Total Protein 6.4 (L) 6.5 - 8.1 g/dL   Albumin 3.8 3.5 - 5.0 g/dL   AST 20 15 - 41 U/L   ALT 17 0 - 44 U/L   Alkaline Phosphatase 44 38 - 126 U/L   Total Bilirubin <0.1 (L) 0.3 - 1.2 mg/dL   GFR calc non Af Amer >60 >60 mL/min   GFR calc Af Amer >60 >60 mL/min   Anion gap 8 5 - 15  CBC with Differential     Status: None   Collection Time: 08/05/19  3:00 PM  Result Value Ref Range   WBC 9.2 4.0 - 10.5 K/uL   RBC 4.16 3.87 - 5.11 MIL/uL   Hemoglobin 12.6 12.0 - 15.0 g/dL   HCT 38.2 36.0 - 46.0 %   MCV 91.8 80.0 - 100.0 fL   MCH 30.3 26.0 - 34.0 pg   MCHC 33.0 30.0 - 36.0 g/dL   RDW 12.2 11.5 - 15.5 %   Platelets 247 150 - 400 K/uL   nRBC 0.0 0.0 - 0.2 %   Neutrophils Relative % 68 %   Neutro Abs 6.4 1.7 - 7.7 K/uL   Lymphocytes Relative 21 %   Lymphs Abs 1.9 0.7 - 4.0 K/uL   Monocytes Relative 8 %   Monocytes Absolute 0.7 0.1 - 1.0 K/uL   Eosinophils Relative 2 %   Eosinophils Absolute 0.2 0.0 - 0.5 K/uL   Basophils Relative  1 %   Basophils Absolute 0.1 0.0 - 0.1 K/uL   Immature Granulocytes 0 %   Abs Immature Granulocytes 0.03 0.00 - 0.07 K/uL   US OB Transvaginal  Result Date: 08/05/2019 CLINICAL DATA:  Pelvic pain with increasing beta HCG level EXAM: TRANSVAGINAL OB ULTRASOUND TECHNIQUE: Transvaginal ultrasound was performed for complete evaluation of the gestation as well as  the maternal uterus, adnexal regions, and pelvic cul-de-sac. COMPARISON:  07/31/2019 FINDINGS: Intrauterine gestational sac: Present Yolk sac:  Present Embryo:  Absent MSD: 7 mm mm   5 w   3 d Subchorionic hemorrhage: Small subchorionic hemorrhage is noted. Is noted. Maternal uterus/adnexae: No focal abnormality is seen. IMPRESSION: Gestational sac and yolk sac are noted at approximately 5 weeks 3 days. This represents adequate progression when compared with the prior exam but again likely represents very early pregnancy. Follow-up examination can be performed as clinically indicated. Small subchorionic hemorrhage Electronically Signed   By: Inez Catalina M.D.   On: 08/05/2019 17:52   Discussed results of [place] and labs with patient - normal IUP seen on [place]. Encouraged patient to follow up as scheduled for prenatal appointment and [place] on 08/19/19 at [facility]. Miscarriage precautions discussed.   Pt stable at time of discharge.  A: 1. Normal IUP (intrauterine pregnancy) on prenatal ultrasound, first trimester   2. Abdominal cramping affecting pregnancy     P: D/C home with precautions  F/U as scheduled with prenatal appointment at [place] Return to MAU as needed for emergencies  Lajean Manes, CNM 08/05/19, 5:49 PM

## 2019-08-16 ENCOUNTER — Other Ambulatory Visit: Payer: Self-pay | Admitting: Obstetrics and Gynecology

## 2019-08-16 DIAGNOSIS — O3680X0 Pregnancy with inconclusive fetal viability, not applicable or unspecified: Secondary | ICD-10-CM

## 2019-08-19 ENCOUNTER — Other Ambulatory Visit: Payer: Self-pay

## 2019-08-19 ENCOUNTER — Ambulatory Visit (INDEPENDENT_AMBULATORY_CARE_PROVIDER_SITE_OTHER): Payer: 59

## 2019-08-19 ENCOUNTER — Other Ambulatory Visit: Payer: 59

## 2019-08-19 DIAGNOSIS — Z3A01 Less than 8 weeks gestation of pregnancy: Secondary | ICD-10-CM | POA: Diagnosis not present

## 2019-08-19 DIAGNOSIS — O3680X0 Pregnancy with inconclusive fetal viability, not applicable or unspecified: Secondary | ICD-10-CM | POA: Diagnosis not present

## 2019-08-19 NOTE — Progress Notes (Signed)
[place] 6+1 wks,single IUP with YS,positive fht 104 bpm,crl 4.1 mm,normal ovaries

## 2019-09-02 ENCOUNTER — Telehealth: Payer: Self-pay | Admitting: *Deleted

## 2019-09-02 NOTE — Telephone Encounter (Signed)
Patient states she woke up this morning and noticed very light pink spotting after using the bathroom. She is concerned and wants to speak with a nurse.    Returned patient's call.  States she only had light spotting once after using the bathroom and is concerned. Informed patient that light spotting can occur in early pregnancy and since it was just once, to continue to monitor.  If bleeding increased or she developed severe abdominal cramping, to let us know. Pt verbalized understanding with no further questions.

## 2019-09-06 ENCOUNTER — Telehealth: Payer: Self-pay | Admitting: *Deleted

## 2019-09-06 NOTE — Telephone Encounter (Signed)
Patient states she has had severe nausea and vomiting this morning and had to call out of work.  She is requesting a work note for today.  Will send in mychart.

## 2019-09-06 NOTE — Telephone Encounter (Signed)
Pt wants a nurse to call her back.

## 2019-09-18 ENCOUNTER — Telehealth: Payer: Self-pay | Admitting: *Deleted

## 2019-09-18 NOTE — Telephone Encounter (Signed)
Pt left message that her ear hurts.

## 2019-09-20 ENCOUNTER — Telehealth: Payer: Self-pay | Admitting: Obstetrics & Gynecology

## 2019-09-20 MED ORDER — NEOMYCIN-POLYMYXIN-HC 3.5-10000-1 OT SOLN: OTIC | 1 refills | Status: DC

## 2019-09-30 ENCOUNTER — Encounter: Payer: Self-pay | Admitting: *Deleted

## 2019-09-30 ENCOUNTER — Other Ambulatory Visit (INDEPENDENT_AMBULATORY_CARE_PROVIDER_SITE_OTHER): Payer: 59

## 2019-09-30 ENCOUNTER — Other Ambulatory Visit: Payer: Self-pay

## 2019-09-30 ENCOUNTER — Inpatient Hospital Stay (HOSPITAL_COMMUNITY)
Admission: AD | Admit: 2019-09-30 | Discharge: 2019-09-30 | Disposition: A | Discharge status: Home / Self Care | Payer: 59 | Attending: Obstetrics and Gynecology | Admitting: Obstetrics and Gynecology

## 2019-09-30 ENCOUNTER — Ambulatory Visit (INDEPENDENT_AMBULATORY_CARE_PROVIDER_SITE_OTHER): Payer: 59 | Admitting: Obstetrics & Gynecology

## 2019-09-30 ENCOUNTER — Encounter: Payer: Self-pay | Admitting: Obstetrics & Gynecology

## 2019-09-30 ENCOUNTER — Encounter (HOSPITAL_COMMUNITY): Payer: Self-pay | Admitting: Obstetrics and Gynecology

## 2019-09-30 ENCOUNTER — Other Ambulatory Visit: Payer: Self-pay | Admitting: Obstetrics & Gynecology

## 2019-09-30 DIAGNOSIS — F4321 Adjustment disorder with depressed mood: Secondary | ICD-10-CM | POA: Insufficient documentation

## 2019-09-30 DIAGNOSIS — Z3A12 12 weeks gestation of pregnancy: Secondary | ICD-10-CM | POA: Diagnosis not present

## 2019-09-30 DIAGNOSIS — O021 Missed abortion: Secondary | ICD-10-CM | POA: Diagnosis not present

## 2019-09-30 DIAGNOSIS — O3680X0 Pregnancy with inconclusive fetal viability, not applicable or unspecified: Secondary | ICD-10-CM

## 2019-09-30 MED ORDER — OXYCODONE HCL 5 MG PO TABS: 5.0000 mg | ORAL_TABLET | Freq: Four times a day (QID) | ORAL | 0 refills | Status: AC | PRN

## 2019-09-30 MED ORDER — LORAZEPAM 0.5 MG PO TABS: 0.5000 mg | ORAL_TABLET | Freq: Four times a day (QID) | ORAL | 0 refills | Status: DC | PRN

## 2019-09-30 MED ORDER — MISOPROSTOL 200 MCG PO TABS: 800.0000 ug | ORAL_TABLET | Freq: Once | ORAL | 0 refills | Status: DC

## 2019-09-30 MED ORDER — IBUPROFEN 600 MG PO TABS: 600.0000 mg | ORAL_TABLET | Freq: Four times a day (QID) | ORAL | 0 refills | Status: DC | PRN

## 2019-09-30 MED ORDER — LORAZEPAM 0.5 MG PO TABS: 0.5000 mg | ORAL_TABLET | Freq: Four times a day (QID) | ORAL | 0 refills | Status: AC | PRN

## 2019-09-30 MED ORDER — OXYCODONE HCL 5 MG PO TABS: 5.0000 mg | ORAL_TABLET | Freq: Four times a day (QID) | ORAL | 0 refills | Status: DC | PRN

## 2019-09-30 MED ORDER — MISOPROSTOL 200 MCG PO TABS
800.0000 ug | ORAL_TABLET | Freq: Once | ORAL | Status: AC
  Administered 2019-09-30: 800 ug via BUCCAL
  Filled 2019-09-30: qty 4

## 2019-09-30 MED ORDER — IBUPROFEN 800 MG PO TABS
800.0000 mg | ORAL_TABLET | Freq: Once | ORAL | Status: AC
  Administered 2019-09-30: 800 mg via ORAL
  Filled 2019-09-30: qty 1

## 2019-09-30 MED ORDER — HYDROXYZINE HCL 50 MG/ML IM SOLN
50.0000 mg | INTRAMUSCULAR | Status: AC
  Administered 2019-09-30: 50 mg via INTRAMUSCULAR
  Filled 2019-09-30: qty 1

## 2019-09-30 NOTE — Discharge Instructions (Signed)
FACTS YOU SHOULD KNOW  WHAT IS AN EARLY PREGNANCY FAILURE? Once the egg is fertilized with the sperm and begins to develop, it attaches to the lining of the uterus. This early pregnancy tissue may not develop into an embryo (the beginning stage of a baby). Sometimes an embryo does develop but does not continue to grow. These problems can be seen on ultrasound.   MANAGEMNT OF EARLY PREGNANCY FAILURE: About 4 out of 100 (0.25%) women will have a pregnancy loss in her lifetime.  One in five pregnancies is found to be an early pregnancy failure.  There are 3 ways to care for an early pregnancy failure:   (1) Surgery, (2) Medicine, (3) Waiting for you to pass the pregnancy on your own. The decision as to how to proceed after being diagnosed with and early pregnancy failure is an individual one.  The decision can be made only after appropriate counseling.  You need to weigh the pros and cons of the 3 choices. Then you can make the choice that works for you. SURGERY (D&E) . Procedure over in 1 day . Requires being put to sleep . Bleeding may be light . Possible problems during surgery, including injury to womb(uterus) . Care provider has more control Medicine (CYTOTEC) . The complete procedure may take days to weeks . No Surgery . Bleeding may be heavy at times . There may be drug side effects . Patient has more control Waiting . You may choose to wait, in which case your own body may complete the passing of the abnormal early pregnancy on its own in about 2-4 weeks . Your bleeding may be heavy at times . There is a small possibility that you may need surgery if the bleeding is too much or not all of the pregnancy has passed. CYTOTEC MANAGEMENT Prostaglandins (cytotec) are the most widely used drug for this purpose. They cause the uterus to cramp and contract. You will place the medicine yourself inside your vagina in the privacy of your home. Empting of the uterus should occur within 3 days but  the process may continue for several weeks. The bleeding may seem heavy at times. POSSIBLE SIDE EFFECTS FROM CYTOTEC . Nausea   Vomiting . Diarrhea Fever . Chills  Hot Flashes Side effects  from the process of the early pregnancy failure include: . Cramping  Bleeding . Headaches  Dizziness RISKS: This is a low risk procedure. Less than 1 in 100 women has a complication. An incomplete passage of the early pregnancy may occur. Also, Hemorrhage (heavy bleeding) could happen.  Rarely the pregnancy will not be passed completely. Excessively heavy bleeding may occur.  Your doctor may need to perform surgery to empty the uterus (D&E). Afterwards: Everybody will feel differently after the early pregnancy completion. You may have soreness or cramps for a day or two. You may have soreness or cramps for day or two.  You may have light bleeding for up to 2 weeks. You may be as active as you feel like being. If you have any of the following problems you may call [facility]missions [facility] at [phone]. . If you have pain that does not get better  with pain medication . Bleeding that soaks through 2 thick full-sized sanitary pads in an hour . Cramps that last longer than 2 days . Foul smelling discharge . Fever above 100.4 degrees F Even if you do not have any of these symptoms, you should have a follow-up exam to make sure you   are healing properly. This appointment will be made for you before you leave the hospital. Your next normal period will start again in 4-6 week after the loss. You can get pregnant soon after the loss, so use birth control right away. Finally: Make sure all your questions are answered before during and after any procedure. Follow up with medical care and family planning methods.      Managing Pregnancy Loss Pregnancy loss can happen any time during a pregnancy. Often the cause is not known. It is rarely because of anything you did. Pregnancy loss in early pregnancy (during the  first trimester) is called a miscarriage. This type of pregnancy loss is the most common. Pregnancy loss that happens after 20 weeks of pregnancy is called fetal demise if the baby's heart stops beating before birth. Fetal demise is much less common. Some women experience spontaneous labor shortly after fetal demise resulting in a stillborn birth (stillbirth). Any pregnancy loss can be devastating. You will need to recover both physically and emotionally. Most women are able to get pregnant again after a pregnancy loss and deliver a healthy baby. How to manage emotional recovery  Pregnancy loss is very hard emotionally. You may feel many different emotions while you grieve. You may feel sad and angry. You may also feel guilty. It is normal to have periods of crying. Emotional recovery can take longer than physical recovery. It is different for everyone. Taking these steps can help you in managing this loss:  Remember that it is unlikely you did anything to cause the pregnancy loss.  Share your thoughts and feelings with friends, family, and your partner. Remember that your partner is also recovering emotionally.  Make sure you have a good support system. Do not spend too much time alone.  Meet with a pregnancy loss counselor or join a pregnancy loss support group.  Get enough sleep and eat a healthy diet. Return to regular exercise when you have recovered physically.  Do not use drugs or alcohol to manage your emotions.  Consider seeing a mental health professional to help you recover emotionally.  Ask a friend or loved one to help you decide what to do with any clothing and nursery items you received for your baby. In the case of a stillbirth, many women benefit from taking additional steps in the grieving process. You may want to:  Hold your baby after the birth.  Name your baby.  Request a birth certificate.  Create a keepsake such as handprints or footprints.  Dress your baby and  have a picture taken.  Make funeral arrangements.  Ask for a baptism or blessing. Hospitals have staff members who can help you with all these arrangements. How to recognize emotional stress It is normal to have emotional stress after a pregnancy loss. But emotional stress that lasts a long time or becomes severe requires treatment. Watch out for these signs of severe emotional stress:  Sadness, anger, or guilt that is not going away and is interfering with your normal activities.  Relationship problems that have occurred or gotten worse since the pregnancy loss.  Signs of depression that last longer than 2 weeks. These may include: ? Sadness. ? Anxiety. ? Hopelessness. ? Loss of interest in activities you enjoy. ? Inability to concentrate. ? Trouble sleeping or sleeping too much. ? Loss of appetite or overeating. ? Thoughts of death or of hurting yourself. Follow these instructions at home:  Take over-the-counter and prescription medicines only as told by  your health care provider.  Rest at home until your energy level returns. Return to your normal activities as told by your health care provider. Ask your health care provider what activities are safe for you.  When you are ready, meet with your health care provider to discuss steps to take for a future pregnancy.  Keep all follow-up visits as told by your health care provider. This is important. Where to find support  To help you and your partner with the process of grieving, talk with your health care provider or seek counseling.  Consider meeting with others who have experienced pregnancy loss. Ask your health care provider about support groups and resources. Where to find more information  [place] Department of Health and Programmer, systems on Women's Health: [place]  American Pregnancy Association: www.americanpregnancy.org Contact a health care provider if:  You continue to experience grief, sadness, or  lack of motivation for everyday activities, and those feelings do not improve over time.  You are struggling to recover emotionally, especially if you are using alcohol or substances to help. Get help right away if:  You have thoughts of hurting yourself or others. If you ever feel like you may hurt yourself or others, or have thoughts about taking your own life, get help right away. You can go to your nearest emergency department or call:  Your local emergency services (911 in the [place]  A suicide crisis helpline, such as the [facility] at 909-458-7471. This is open 24 hours a day. Summary  Any pregnancy loss can be difficult physically and emotionally.  You may experience many different emotions while you grieve. Emotional recovery can last longer than physical recovery.  It is normal to have emotional stress after a pregnancy loss. But emotional stress that lasts a long time or becomes severe requires treatment.  See your health care provider if you are struggling emotionally after a pregnancy loss. This information is not intended to replace advice given to you by your health care provider. Make sure you discuss any questions you have with your health care provider. Document Revised: 11/07/2018 Document Reviewed: 09/28/2017 Elsevier Patient Education  2020 [employer].
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
## 2019-09-30 NOTE — Progress Notes (Signed)
[place] 6+1 wks,single IUP,no fetal heart tones visualized,irregular GS 60.5 mm=12+1 wks,normal ovaries,Dr Eure discussed results with pt
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
## 2019-10-02 ENCOUNTER — Ambulatory Visit: Payer: 59 | Admitting: *Deleted

## 2019-10-02 ENCOUNTER — Telehealth: Payer: Self-pay | Admitting: *Deleted

## 2019-10-02 ENCOUNTER — Other Ambulatory Visit: Payer: 59

## 2019-10-02 ENCOUNTER — Encounter: Payer: 59 | Admitting: Women's Health

## 2019-10-02 NOTE — Telephone Encounter (Signed)
Pt was seen in MAU on Monday and given cytotec. She repeated the dose last night. She has still had no bleeding at all. She really is just ready to move on from this process. She wanted to see if Dr. Elonda Husky would just go ahead and schedule the D&C. Advised I would send a message to Dr. Elonda Husky and I would call her tomorrow morning and let her know what the plan was. Patient agreeable to plan. If she does have bleeding tonight she will let me know.

## 2019-10-02 NOTE — Telephone Encounter (Signed)
I believe She has an appointment scheduled for Thursday afternoon.  Tell her to keep that and we can discuss it more fully at that time.  Thank you!

## 2019-10-03 ENCOUNTER — Other Ambulatory Visit: Payer: Self-pay | Admitting: Obstetrics & Gynecology

## 2019-10-03 ENCOUNTER — Encounter: Payer: Self-pay | Admitting: *Deleted

## 2019-10-03 ENCOUNTER — Ambulatory Visit (INDEPENDENT_AMBULATORY_CARE_PROVIDER_SITE_OTHER): Payer: 59 | Admitting: Obstetrics & Gynecology

## 2019-10-03 ENCOUNTER — Other Ambulatory Visit: Payer: Self-pay

## 2019-10-03 ENCOUNTER — Telehealth: Payer: Self-pay | Admitting: Obstetrics & Gynecology

## 2019-10-03 ENCOUNTER — Encounter: Payer: Self-pay | Admitting: Obstetrics & Gynecology

## 2019-10-03 VITALS — BP 120/79 | HR 74 | Ht 61.0 in | Wt 144.0 lb

## 2019-10-03 DIAGNOSIS — O021 Missed abortion: Secondary | ICD-10-CM

## 2019-10-03 NOTE — Telephone Encounter (Signed)
The patient called and requested a repeat ultrasound to serve as confirmation of pregnancy loss  Estill Bamberg Rash LPN spoke with Dr. Elonda Husky and was instructed that we would not be able to perform ultrasound in our office as a second opinion.  He recommend that she go to another facility such as [facility] for this second opinion.  The patient was informed and she stated that she would keep appointment with Dr. Elonda Husky as scheduled.  However, she called back and requested to speak with Engineer, building services or Clinical Supervisor.  I spoke the patient and she stated that she is not requesting a second opinion, as she does not doubt Dr. Elonda Husky and our sonographer.  She needs this ultrasound for her own peace of mind in order to move forward.  She has taken the Cytotec as prescribed and has failed to bleed.  She feels that this may be a sign.  She is struggling to deal with the loss and wants reassurance and/or confirmation.  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  I had this discussion with Dr. Elonda Husky and the decision was made for the patient to have an ultrasound at [facility].  Orders were placed and Amanda Rash LPN scheduled the scan for March 9 at 1:15 pm, (first available appointment)  Dr. Elonda Husky will see the patient in the office at 4:00 pm on the same day.     The patient called and I explained all of this to her.  She was told about the ultrasound scheduled at [facility] on Tuesday and appointment with Dr. Elonda Husky.  She stated that she did not want to wait until Tuesday. She prefers to just go ahead and see Dr. Elonda Husky today.  She recalled her visit to the MAU where she was seen by Dr. Rip Harbour.  He did not repeat the ultrasound at the time of that visit due to the scan being performed in our office and his confidence in the accuracy of the scan.  Again, she is having a hard time moving forward and just needs reassurance.    Therefore, she has decided to keep the appointment  today and move forward with scheduling of the D&C.  She requests that we cancel the ultrasound scheduled for March 9.

## 2019-10-03 NOTE — Telephone Encounter (Signed)
Pt informed. Coming today at 400 pm

## 2019-10-04 ENCOUNTER — Encounter: Payer: Self-pay | Admitting: Obstetrics & Gynecology

## 2019-10-04 ENCOUNTER — Encounter (HOSPITAL_COMMUNITY): Payer: Self-pay

## 2019-10-04 ENCOUNTER — Other Ambulatory Visit: Payer: Self-pay

## 2019-10-04 ENCOUNTER — Other Ambulatory Visit (HOSPITAL_COMMUNITY)
Admission: RE | Admit: 2019-10-04 | Discharge: 2019-10-04 | Disposition: A | Discharge status: Home / Self Care | Payer: 59 | Source: Ambulatory Visit | Attending: Obstetrics & Gynecology | Admitting: Obstetrics & Gynecology

## 2019-10-04 ENCOUNTER — Encounter (HOSPITAL_COMMUNITY)
Admission: RE | Admit: 2019-10-04 | Discharge: 2019-10-04 | Disposition: A | Discharge status: Home / Self Care | Payer: 59 | Source: Ambulatory Visit | Attending: Obstetrics & Gynecology | Admitting: Obstetrics & Gynecology

## 2019-10-04 DIAGNOSIS — Z20822 Contact with and (suspected) exposure to covid-19: Secondary | ICD-10-CM | POA: Diagnosis not present

## 2019-10-04 DIAGNOSIS — Z01812 Encounter for preprocedural laboratory examination: Secondary | ICD-10-CM | POA: Diagnosis not present
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
## 2019-10-04 NOTE — Pre-Procedure Instructions (Signed)
Spoke with Dr Elonda Husky concerning repeating labs. He wants [place] to use the CBC and Comp panel from 08/05/2019.

## 2019-10-05 LAB — SARS CORONAVIRUS 2 (TAT 6-24 HRS): SARS Coronavirus 2: NEGATIVE

## 2019-10-07 ENCOUNTER — Encounter (HOSPITAL_COMMUNITY)
Admission: RE | Disposition: A | Discharge status: Home / Self Care | Payer: Self-pay | Source: Home / Self Care | Attending: Obstetrics & Gynecology

## 2019-10-07 ENCOUNTER — Other Ambulatory Visit: Payer: Self-pay

## 2019-10-07 ENCOUNTER — Ambulatory Visit (HOSPITAL_COMMUNITY): Payer: 59 | Admitting: Anesthesiology

## 2019-10-07 ENCOUNTER — Other Ambulatory Visit: Payer: Self-pay | Admitting: Obstetrics & Gynecology

## 2019-10-07 ENCOUNTER — Ambulatory Visit (HOSPITAL_COMMUNITY)
Admission: RE | Admit: 2019-10-07 | Discharge: 2019-10-07 | Disposition: A | Discharge status: Home / Self Care | Payer: 59 | Attending: Obstetrics & Gynecology | Admitting: Obstetrics & Gynecology

## 2019-10-07 DIAGNOSIS — Z87892 Personal history of anaphylaxis: Secondary | ICD-10-CM | POA: Insufficient documentation

## 2019-10-07 DIAGNOSIS — O021 Missed abortion: Secondary | ICD-10-CM | POA: Insufficient documentation

## 2019-10-07 DIAGNOSIS — Z881 Allergy status to other antibiotic agents status: Secondary | ICD-10-CM | POA: Diagnosis not present

## 2019-10-07 DIAGNOSIS — Z3A12 12 weeks gestation of pregnancy: Secondary | ICD-10-CM | POA: Diagnosis not present

## 2019-10-07 DIAGNOSIS — O081 Delayed or excessive hemorrhage following ectopic and molar pregnancy: Secondary | ICD-10-CM | POA: Diagnosis not present

## 2019-10-07 HISTORY — PX: DILATION AND CURETTAGE OF UTERUS: SHX78

## 2019-10-07 LAB — ABO/RH: ABO/RH(D): O POS

## 2019-10-07 LAB — CBC
HCT: 29.4 % — ABNORMAL LOW (ref 36.0–46.0)
Hemoglobin: 9.4 g/dL — ABNORMAL LOW (ref 12.0–15.0)
MCH: 31.5 pg (ref 26.0–34.0)
MCHC: 32 g/dL (ref 30.0–36.0)
MCV: 98.7 fL (ref 80.0–100.0)
Platelets: 220 10*3/uL (ref 150–400)
RBC: 2.98 MIL/uL — ABNORMAL LOW (ref 3.87–5.11)
RDW: 12.5 % (ref 11.5–15.5)
WBC: 12.2 10*3/uL — ABNORMAL HIGH (ref 4.0–10.5)
nRBC: 0 % (ref 0.0–0.2)

## 2019-10-07 LAB — PREPARE RBC (CROSSMATCH)

## 2019-10-07 SURGERY — DILATION AND CURETTAGE
Anesthesia: General

## 2019-10-07 MED ORDER — HYDROMORPHONE HCL 1 MG/ML IJ SOLN
INTRAMUSCULAR | Status: AC
  Filled 2019-10-07: qty 0.5

## 2019-10-07 MED ORDER — CARBOPROST TROMETHAMINE 250 MCG/ML IM SOLN
INTRAMUSCULAR | Status: AC
  Filled 2019-10-07: qty 2

## 2019-10-07 MED ORDER — CEFAZOLIN SODIUM-DEXTROSE 2-4 GM/100ML-% IV SOLN
INTRAVENOUS | Status: AC
  Filled 2019-10-07: qty 100

## 2019-10-07 MED ORDER — CARBOPROST TROMETHAMINE 250 MCG/ML IM SOLN
500.0000 ug | Freq: Once | INTRAMUSCULAR | Status: DC
  Filled 2019-10-07: qty 2

## 2019-10-07 MED ORDER — TRANEXAMIC ACID-NACL 1000-0.7 MG/100ML-% IV SOLN
INTRAVENOUS | Status: DC | PRN
  Administered 2019-10-07: 1000 mg via INTRAVENOUS

## 2019-10-07 MED ORDER — MISOPROSTOL 200 MCG PO TABS: 200.0000 ug | ORAL_TABLET | Freq: Three times a day (TID) | ORAL | 0 refills | Status: DC

## 2019-10-07 MED ORDER — MIDAZOLAM HCL 2 MG/2ML IJ SOLN
INTRAMUSCULAR | Status: AC
  Filled 2019-10-07: qty 2

## 2019-10-07 MED ORDER — MIDAZOLAM HCL 5 MG/5ML IJ SOLN
INTRAMUSCULAR | Status: DC | PRN
  Administered 2019-10-07 (×2): 1 mg via INTRAVENOUS

## 2019-10-07 MED ORDER — LACTATED RINGERS IV SOLN: INTRAVENOUS | Status: DC

## 2019-10-07 MED ORDER — 0.9 % SODIUM CHLORIDE (POUR BTL) OPTIME
TOPICAL | Status: DC | PRN
  Administered 2019-10-07: 1000 mL

## 2019-10-07 MED ORDER — METHYLERGONOVINE MALEATE 0.2 MG/ML IJ SOLN
INTRAMUSCULAR | Status: AC
  Filled 2019-10-07: qty 1

## 2019-10-07 MED ORDER — FENTANYL CITRATE (PF) 100 MCG/2ML IJ SOLN
INTRAMUSCULAR | Status: DC | PRN
  Administered 2019-10-07: 25 ug via INTRAVENOUS
  Administered 2019-10-07: 50 ug via INTRAVENOUS

## 2019-10-07 MED ORDER — LORAZEPAM 2 MG/ML IJ SOLN
1.0000 mg | Freq: Once | INTRAMUSCULAR | Status: AC
  Administered 2019-10-07: 1 mg via INTRAVENOUS

## 2019-10-07 MED ORDER — KETOROLAC TROMETHAMINE 30 MG/ML IJ SOLN
INTRAMUSCULAR | Status: AC
  Filled 2019-10-07: qty 1

## 2019-10-07 MED ORDER — PROPOFOL 10 MG/ML IV BOLUS
INTRAVENOUS | Status: DC | PRN
  Administered 2019-10-07: 180 mg via INTRAVENOUS

## 2019-10-07 MED ORDER — SODIUM CHLORIDE 0.9% IV SOLUTION
Freq: Once | INTRAVENOUS | Status: AC
  Administered 2019-10-07: 1000 mL via INTRAVENOUS

## 2019-10-07 MED ORDER — CARBOPROST TROMETHAMINE 250 MCG/ML IM SOLN
INTRAMUSCULAR | Status: DC | PRN
  Administered 2019-10-07 (×2): 500 ug via INTRAMUSCULAR

## 2019-10-07 MED ORDER — METHYLERGONOVINE MALEATE 0.2 MG/ML IJ SOLN
0.2000 mg | Freq: Once | INTRAMUSCULAR | Status: AC
  Administered 2019-10-07: 11:00:00 0.2 mg via INTRAMUSCULAR
  Filled 2019-10-07: qty 1

## 2019-10-07 MED ORDER — LORAZEPAM 2 MG/ML IJ SOLN
INTRAMUSCULAR | Status: AC
  Filled 2019-10-07: qty 1

## 2019-10-07 MED ORDER — ONDANSETRON HCL 4 MG/2ML IJ SOLN
INTRAMUSCULAR | Status: DC | PRN
  Administered 2019-10-07: 4 mg via INTRAVENOUS

## 2019-10-07 MED ORDER — TRANEXAMIC ACID-NACL 1000-0.7 MG/100ML-% IV SOLN
INTRAVENOUS | Status: AC
  Filled 2019-10-07: qty 100

## 2019-10-07 MED ORDER — CEFAZOLIN SODIUM-DEXTROSE 2-4 GM/100ML-% IV SOLN
2.0000 g | INTRAVENOUS | Status: AC
  Administered 2019-10-07: 2 g via INTRAVENOUS

## 2019-10-07 MED ORDER — KETOROLAC TROMETHAMINE 30 MG/ML IJ SOLN
30.0000 mg | Freq: Once | INTRAMUSCULAR | Status: AC
  Administered 2019-10-07: 30 mg via INTRAVENOUS

## 2019-10-07 MED ORDER — OXYCODONE-ACETAMINOPHEN 5-325 MG PO TABS: 1.0000 | ORAL_TABLET | ORAL | 0 refills | Status: DC | PRN

## 2019-10-07 MED ORDER — FENTANYL CITRATE (PF) 100 MCG/2ML IJ SOLN
INTRAMUSCULAR | Status: AC
  Filled 2019-10-07: qty 2

## 2019-10-07 MED ORDER — METHYLERGONOVINE MALEATE 0.2 MG/ML IJ SOLN
INTRAMUSCULAR | Status: DC | PRN
  Administered 2019-10-07: .2 mg via INTRAMUSCULAR

## 2019-10-07 MED ORDER — EPHEDRINE SULFATE 50 MG/ML IJ SOLN
INTRAMUSCULAR | Status: DC | PRN
  Administered 2019-10-07 (×2): 10 mg via INTRAVENOUS

## 2019-10-07 MED ORDER — MISOPROSTOL 200 MCG PO TABS
800.0000 ug | ORAL_TABLET | Freq: Once | ORAL | Status: DC
  Filled 2019-10-07: qty 4

## 2019-10-07 MED ORDER — KETOROLAC TROMETHAMINE 10 MG PO TABS: 10.0000 mg | ORAL_TABLET | Freq: Three times a day (TID) | ORAL | 0 refills | Status: DC | PRN

## 2019-10-07 MED ORDER — PHENYLEPHRINE HCL (PRESSORS) 10 MG/ML IV SOLN
INTRAVENOUS | Status: DC | PRN
  Administered 2019-10-07: 80 ug via INTRAVENOUS

## 2019-10-07 MED ORDER — MISOPROSTOL 100 MCG PO TABS
ORAL_TABLET | ORAL | Status: DC | PRN
  Administered 2019-10-07: 800 ug via RECTAL

## 2019-10-07 MED ORDER — TRANEXAMIC ACID-NACL 1000-0.7 MG/100ML-% IV SOLN
1000.0000 mg | INTRAVENOUS | Status: AC
  Administered 2019-10-07: 1000 mg via INTRAVENOUS

## 2019-10-07 MED ORDER — LIDOCAINE HCL (CARDIAC) PF 50 MG/5ML IV SOSY
PREFILLED_SYRINGE | INTRAVENOUS | Status: DC | PRN
  Administered 2019-10-07: 50 mg via INTRAVENOUS

## 2019-10-07 MED ORDER — HYDROMORPHONE HCL 1 MG/ML IJ SOLN
0.5000 mg | INTRAMUSCULAR | Status: DC | PRN
  Administered 2019-10-07 (×2): 0.5 mg via INTRAVENOUS

## 2019-10-07 SURGICAL SUPPLY — 28 items
BAG HAMPER (MISCELLANEOUS) ×2 IMPLANT
CATH FOLEY LATEX FREE 16FR (CATHETERS) ×1
CATH FOLEY LF 16FR (CATHETERS) ×1 IMPLANT
CLOTH BEACON ORANGE TIMEOUT ST (SAFETY) ×2 IMPLANT
COVER LIGHT HANDLE STERIS (MISCELLANEOUS) ×4 IMPLANT
COVER WAND RF STERILE (DRAPES) ×4 IMPLANT
GAUZE 4X4 16PLY RFD (DISPOSABLE) ×4 IMPLANT
GLOVE BIOGEL PI IND STRL 7.0 (GLOVE) ×3 IMPLANT
GLOVE BIOGEL PI IND STRL 8 (GLOVE) ×1 IMPLANT
GLOVE BIOGEL PI INDICATOR 7.0 (GLOVE) ×3
GLOVE BIOGEL PI INDICATOR 8 (GLOVE) ×1
GLOVE ECLIPSE 8.0 STRL XLNG CF (GLOVE) ×4 IMPLANT
GOWN STRL REUS W/TWL LRG LVL3 (GOWN DISPOSABLE) ×2 IMPLANT
GOWN STRL REUS W/TWL XL LVL3 (GOWN DISPOSABLE) ×2 IMPLANT
KIT BERKELEY 1ST TRIMESTER 3/8 (MISCELLANEOUS) ×6 IMPLANT
KIT TURNOVER CYSTO (KITS) ×2 IMPLANT
MANIFOLD NEPTUNE II (INSTRUMENTS) ×2 IMPLANT
MARKER SKIN DUAL TIP RULER LAB (MISCELLANEOUS) ×2 IMPLANT
NS IRRIG 1000ML POUR BTL (IV SOLUTION) ×2 IMPLANT
PACK BASIC III (CUSTOM PROCEDURE TRAY) ×1
PACK SRG BSC III STRL LF ECLPS (CUSTOM PROCEDURE TRAY) ×1 IMPLANT
PAD ARMBOARD 7.5X6 YLW CONV (MISCELLANEOUS) ×2 IMPLANT
SET BASIN LINEN APH (SET/KITS/TRAYS/PACK) ×2 IMPLANT
SET BERKELEY SUCTION TUBING (SUCTIONS) ×2 IMPLANT
SHEET LAVH (DRAPES) ×2 IMPLANT
SPONGE LAP 4X18 RFD (DISPOSABLE) ×2 IMPLANT
SURGILUBE 2OZ TUBE FLIPTOP (MISCELLANEOUS) ×2 IMPLANT
VACURETTE 10MM (CANNULA) ×2 IMPLANT

## 2019-10-07 NOTE — Progress Notes (Signed)
Pt wanted [facility] in [place] vs [place]. Fixed in computer and Dr Elonda Husky is aware. Advised pt/family that it will be at least one hour before Dr Elonda Husky can get to a computer. Pt/family agreed. Pt dc in nad.

## 2019-10-07 NOTE — Interval H&P Note (Signed)
History and Physical Interval Note:  10/07/2019 9:24 AM  Erin Dennis  has presented today for surgery, with the diagnosis of Missesd AB.  The various methods of treatment have been discussed with the patient and family. After consideration of risks, benefits and other options for treatment, the patient has consented to  Procedure(s) with comments: [place] (N/A) - Dr. Request Time 9:30am as a surgical intervention.  The patient's history has been reviewed, patient examined, no change in status, stable for surgery.  I have reviewed the patient's chart and labs.  Questions were answered to the patient's satisfaction.     [name]

## 2019-10-07 NOTE — Anesthesia Postprocedure Evaluation (Signed)
Anesthesia Post Note  Patient: Environmental health practitioner  Procedure(s) Performed: SUCTION DILATATION AND CURETTAGE (N/A )  Patient location during evaluation: PACU Anesthesia Type: General Level of consciousness: awake and alert and oriented Pain management: pain level controlled Vital Signs Assessment: post-procedure vital signs reviewed and stable Respiratory status: spontaneous breathing Cardiovascular status: blood pressure returned to baseline and stable Postop Assessment: no apparent nausea or vomiting Anesthetic complications: no     Last Vitals:  Vitals:   10/07/19 1130 10/07/19 1145  BP: 95/76 98/71  Pulse: (!) 56 (!) 59  Resp: 17 20  Temp:    SpO2:      Last Pain:  Vitals:   10/07/19 1104  PainSc: 7                  Elye Harmsen

## 2019-10-07 NOTE — Discharge Instructions (Signed)
Dilation and Curettage or Vacuum Curettage, Care After These instructions give you information about caring for yourself after your procedure. Your doctor may also give you more specific instructions. Call your doctor if you have any problems or questions after your procedure. Follow these instructions at home: Activity  Do not drive or use heavy machinery while taking prescription pain medicine.  For 24 hours after your procedure, avoid driving.  Take short walks often, followed by rest periods. Ask your doctor what activities are safe for you. After one or two days, you may be able to return to your normal activities.  Do not lift anything that is heavier than 10 lb (4.5 kg) until your doctor approves.  For at least 2 weeks, or as long as told by your doctor: ? Do not douche. ? Do not use tampons. ? Do not have sex. General instructions   Take over-the-counter and prescription medicines only as told by your doctor. This is very important if you take blood thinning medicine.  Do not take baths, swim, or use a hot tub until your doctor approves. Take showers instead of baths.  Wear compression stockings as told by your doctor.  It is up to you to get the results of your procedure. Ask your doctor when your results will be ready.  Keep all follow-up visits as told by your doctor. This is important. Contact a doctor if:  You have very bad cramps that get worse or do not get better with medicine.  You have very bad pain in your belly (abdomen).  You cannot drink fluids without throwing up (vomiting).  You get pain in a different part of the area between your belly and thighs (pelvis).  You have bad-smelling discharge from your vagina.  You have a rash. Get help right away if:  You are bleeding a lot from your vagina. A lot of bleeding means soaking more than one sanitary pad in an hour, for 2 hours in a row.  You have clumps of blood (blood clots) coming from your  vagina.  You have a fever or chills.  Your belly feels very tender or hard.  You have chest pain.  You have trouble breathing.  You cough up blood.  You feel dizzy.  You feel light-headed.  You pass out (faint).  You have pain in your neck or shoulder area. Summary  Take short walks often, followed by rest periods. Ask your doctor what activities are safe for you. After one or two days, you may be able to return to your normal activities.  Do not lift anything that is heavier than 10 lb (4.5 kg) until your doctor approves.  Do not take baths, swim, or use a hot tub until your doctor approves. Take showers instead of baths.  Contact your doctor if you have any symptoms of infection, like bad-smelling discharge from your vagina. This information is not intended to replace advice given to you by your health care provider. Make sure you discuss any questions you have with your health care provider. Document Revised: 06/30/2017 Document Reviewed: 04/04/2016 Elsevier Patient Education  2020 [place] Anesthesia, Adult, Care After This sheet gives you information about how to care for yourself after your procedure. Your health care provider may also give you more specific instructions. If you have problems or questions, contact your health care provider. What can I expect after the procedure? After the procedure, the following side effects are common:  Pain or discomfort at the  IV site.  Nausea.  Vomiting.  Sore throat.  Trouble concentrating.  Feeling cold or chills.  Weak or tired.  Sleepiness and fatigue.  Soreness and body aches. These side effects can affect parts of the body that were not involved in surgery. Follow these instructions at home:  For at least 24 hours after the procedure:  Have a responsible adult stay with you. It is important to have someone help care for you until you are awake and alert.  Rest as needed.  Do  not: ? Participate in activities in which you could fall or become injured. ? Drive. ? Use heavy machinery. ? Drink alcohol. ? Take sleeping pills or medicines that cause drowsiness. ? Make important decisions or sign legal documents. ? Take care of children on your own. Eating and drinking  Follow any instructions from your health care provider about eating or drinking restrictions.  When you feel hungry, start by eating small amounts of foods that are soft and easy to digest (bland), such as toast. Gradually return to your regular diet.  Drink enough fluid to keep your urine pale yellow.  If you vomit, rehydrate by drinking water, juice, or clear broth. General instructions  If you have sleep apnea, surgery and certain medicines can increase your risk for breathing problems. Follow instructions from your health care provider about wearing your sleep device: ? Anytime you are sleeping, including during daytime naps. ? While taking prescription pain medicines, sleeping medicines, or medicines that make you drowsy.  Return to your normal activities as told by your health care provider. Ask your health care provider what activities are safe for you.  Take over-the-counter and prescription medicines only as told by your health care provider.  If you smoke, do not smoke without supervision.  Keep all follow-up visits as told by your health care provider. This is important. Contact a health care provider if:  You have nausea or vomiting that does not get better with medicine.  You cannot eat or drink without vomiting.  You have pain that does not get better with medicine.  You are unable to pass urine.  You develop a skin rash.  You have a fever.  You have redness around your IV site that gets worse. Get help right away if:  You have difficulty breathing.  You have chest pain.  You have blood in your urine or stool, or you vomit blood. Summary  After the procedure, it  is common to have a sore throat or nausea. It is also common to feel tired.  Have a responsible adult stay with you for the first 24 hours after general anesthesia. It is important to have someone help care for you until you are awake and alert.  When you feel hungry, start by eating small amounts of foods that are soft and easy to digest (bland), such as toast. Gradually return to your regular diet.  Drink enough fluid to keep your urine pale yellow.  Return to your normal activities as told by your health care provider. Ask your health care provider what activities are safe for you. This information is not intended to replace advice given to you by your health care provider. Make sure you discuss any questions you have with your health care provider. Document Revised: 07/21/2017 Document Reviewed: 03/03/2017 Elsevier Patient Education  [place].
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
## 2019-10-07 NOTE — Anesthesia Procedure Notes (Signed)
Procedure Name: LMA Insertion Date/Time: 10/07/2019 9:45 AM Performed by: Ollen Bowl, CRNA Pre-anesthesia Checklist: Patient identified, Patient being monitored, Emergency Drugs available, Timeout performed and Suction available Patient Re-evaluated:Patient Re-evaluated prior to induction Oxygen Delivery Method: Circle System Utilized Preoxygenation: Pre-oxygenation with 100% oxygen Induction Type: IV induction Ventilation: Mask ventilation without difficulty LMA: LMA inserted LMA Size: 3.0 Number of attempts: 1 Placement Confirmation: positive ETCO2 and breath sounds checked- equal and bilateral

## 2019-10-07 NOTE — Transfer of Care (Signed)
Immediate Anesthesia Transfer of Care Note  Patient: Erin Dennis  Procedure(s) Performed: SUCTION DILATATION AND CURETTAGE (N/A )  Patient Location: PACU  Anesthesia Type:General  Level of Consciousness: awake  Airway & Oxygen Therapy: Patient Spontanous Breathing  Post-op Assessment: Report given to RN  Post vital signs: Reviewed  Last Vitals:  Vitals Value Taken Time  BP 93/60 10/07/19 1104  Temp    Pulse 58 10/07/19 1111  Resp 21 10/07/19 1111  SpO2 100 % 10/07/19 1111  Vitals shown include unvalidated device data.  Last Pain:  Vitals:   10/07/19 0830  PainSc: 0-No pain         Complications: No apparent anesthesia complications
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
## 2019-10-07 NOTE — Anesthesia Preprocedure Evaluation (Signed)
Anesthesia Evaluation  Patient identified by MRN, date of birth, ID band Patient awake    Reviewed: Allergy & Precautions, H&P , NPO status , Patient's Chart, lab work & pertinent test results  Airway Mallampati: II  TM Distance: >3 FB Neck ROM: full    Dental no notable dental hx.    Pulmonary neg pulmonary ROS,    Pulmonary exam normal        Cardiovascular negative cardio ROS Normal cardiovascular exam     Neuro/Psych negative neurological ROS  negative psych ROS   GI/Hepatic negative GI ROS, Neg liver ROS,   Endo/Other  negative endocrine ROS  Renal/GU negative Renal ROS  negative genitourinary   Musculoskeletal   Abdominal   Peds  Hematology negative hematology ROS (+)   Anesthesia Other Findings   Reproductive/Obstetrics negative OB ROS                             Anesthesia Physical Anesthesia Plan  ASA: II  Anesthesia Plan: General   Post-op Pain Management:    Induction:   PONV Risk Score and Plan: 3 and Ondansetron  Airway Management Planned:   Additional Equipment:   Intra-op Plan:   Post-operative Plan:   Informed Consent: I have reviewed the patients History and Physical, chart, labs and discussed the procedure including the risks, benefits and alternatives for the proposed anesthesia with the patient or authorized representative who has indicated his/her understanding and acceptance.       Plan Discussed with: CRNA  Anesthesia Plan Comments:         Anesthesia Quick Evaluation

## 2019-10-08 ENCOUNTER — Ambulatory Visit (HOSPITAL_COMMUNITY): Payer: 59

## 2019-10-08 LAB — SURGICAL PATHOLOGY

## 2019-10-08 LAB — POCT HEMOGLOBIN-HEMACUE: Hemoglobin: 5.3 g/dL — CL (ref 12.0–15.0)

## 2019-10-09 LAB — BPAM RBC
Blood Product Expiration Date: 202104082359
Blood Product Expiration Date: 202104082359
ISSUE DATE / TIME: 202103081140
ISSUE DATE / TIME: 202103081256
Unit Type and Rh: 5100
Unit Type and Rh: 5100

## 2019-10-09 LAB — TYPE AND SCREEN
ABO/RH(D): O POS
Antibody Screen: NEGATIVE
Unit division: 0
Unit division: 0

## 2019-10-14 ENCOUNTER — Encounter: Payer: Self-pay | Admitting: *Deleted

## 2019-10-15 ENCOUNTER — Encounter: Payer: Self-pay | Admitting: Obstetrics & Gynecology

## 2019-10-15 ENCOUNTER — Encounter: Payer: Self-pay | Admitting: *Deleted

## 2019-10-15 ENCOUNTER — Ambulatory Visit (INDEPENDENT_AMBULATORY_CARE_PROVIDER_SITE_OTHER): Payer: 59 | Admitting: Obstetrics & Gynecology

## 2019-10-15 ENCOUNTER — Other Ambulatory Visit: Payer: Self-pay

## 2019-10-15 VITALS — BP 135/85 | HR 77 | Ht 61.0 in | Wt 146.0 lb

## 2019-10-15 DIAGNOSIS — O021 Missed abortion: Secondary | ICD-10-CM

## 2019-10-15 DIAGNOSIS — D649 Anemia, unspecified: Secondary | ICD-10-CM

## 2019-10-15 LAB — POCT HEMOGLOBIN: Hemoglobin: 9.3 g/dL — AB (ref 11–14.6)

## 2019-10-15 NOTE — Progress Notes (Signed)
  HPI: Patient returns for routine postoperative follow-up having undergone D&C on 10/07/19 for missed ab in the first triester.  The patient's immediate postoperative recovery has been unremarkable. Since hospital discharge the patient reports minimal bleeding post op.   Current Outpatient Medications: .  ibuprofen (ADVIL) 600 MG tablet, Take 1 tablet (600 mg total) by mouth every 6 (six) hours as needed., Disp: 30 tablet, Rfl: 0 .  LORazepam (ATIVAN) 0.5 MG tablet, Take 0.5 mg by mouth as needed for anxiety., Disp: , Rfl:  .  oxyCODONE-acetaminophen (PERCOCET) 5-325 MG tablet, Take 1-2 tablets by mouth every 4 (four) hours as needed for severe pain. (Patient not taking: Reported on 10/15/2019), Disp: 15 tablet, Rfl: 0  No current facility-administered medications for this visit.    Blood pressure 135/85, pulse 77, height 5\' 1"  (1.549 m), weight 146 lb (66.2 kg), not currently breastfeeding.  Physical Exam: Normal post D&C exam  No tenderness  Diagnostic Tests:   Pathology: Normal missed AB  Impression: S/P D&C for missed ab with hemorrhage  Plan: Delay intercourse for 4 weeks then try to get pregnant without delay needed  Follow up: prn  When pregnant  [name], MD

## 2019-10-31 ENCOUNTER — Encounter (HOSPITAL_COMMUNITY): Payer: Self-pay

## 2019-11-14 ENCOUNTER — Other Ambulatory Visit: Payer: Self-pay

## 2019-11-14 ENCOUNTER — Ambulatory Visit (INDEPENDENT_AMBULATORY_CARE_PROVIDER_SITE_OTHER): Payer: 59 | Admitting: Obstetrics & Gynecology

## 2019-11-14 ENCOUNTER — Encounter: Payer: Self-pay | Admitting: Obstetrics & Gynecology

## 2019-11-14 VITALS — BP 130/87 | HR 74 | Ht 61.0 in | Wt 144.4 lb

## 2019-11-14 DIAGNOSIS — F329 Major depressive disorder, single episode, unspecified: Secondary | ICD-10-CM | POA: Diagnosis not present

## 2019-11-14 MED ORDER — ESCITALOPRAM OXALATE 10 MG PO TABS: 10.0000 mg | ORAL_TABLET | Freq: Every day | ORAL | 1 refills | Status: DC

## 2019-11-14 NOTE — Progress Notes (Signed)
Subjective:   Erin Dennis is an 39 y.o. female who presents for evaluation and treatment of depressive symptoms.  Onset approximately associated with her pregnancy loss  since that time.  Current symptoms include depressed mood, anhedonia, insomnia, difficulty concentrating, anxiety,.  Current treatment for depression:None Sleep problems: Moderate   Early awakening:   Energy: Poor Motivation: Poor Concentration: Poor Rumination/worrying: Moderate Memory: Fair Tearfulness: Marked  Anxiety: Moderate  Panic: Mild  Overall Mood: n/a  Hopelessness: Moderate Suicidal ideation: Absent  Other/Psychosocial Stressors: none Family history positive for depression in the patient's .  Previous treatment modalities employed include None.  Past episodes of depression:yes 1999 with loss of her father Organic causes of depression present: None.  Review of Systems Pertinent items are noted in HPI.   Objective:   Mental Status Examination: Posture and motor behavior: Appropriate Dress, grooming, personal hygiene: Appropriate Facial expression: Appropriate Speech: Appropriate Mood: tearful Coherency and relevance of thought: Appropriate Thought content: Appropriate Perceptions: Appropriate Orientation:Appropriate Attention and concentration: Appropriate Memory: : Appropriate Information: Not examined Vocabulary: Appropriate Abstract reasoning: Not examined Judgment: Not examined    Assessment:   Experiencing the following symptoms of depression most of the day nearly every day for more than two consecutive weeks: depressed mood, loss of interests/pleasure, change in sleep, loss of energy, trouble concentrating  Depressive Disorder:reactive  Suicide Risk Assessment:  Suicidal intent: none Suicidal plan: none Access to means for suicide: none Lethality of means for suicide: non3 Prior suicide attempts: none Recent exposure to suicide:none    Plan:   Depression,  reactive  Reviewed concept of depression as biochemical imbalance of neurotransmitters and rationale for treatment. Instructed patient to contact office or on-call physician promptly should condition worsen or any new symptoms appear and provided on-call telephone numbers.

## 2019-12-01 ENCOUNTER — Ambulatory Visit (INDEPENDENT_AMBULATORY_CARE_PROVIDER_SITE_OTHER)
Admission: RE | Admit: 2019-12-01 | Discharge: 2019-12-01 | Disposition: A | Discharge status: Home / Self Care | Payer: 59 | Source: Ambulatory Visit

## 2019-12-01 DIAGNOSIS — S6992XA Unspecified injury of left wrist, hand and finger(s), initial encounter: Secondary | ICD-10-CM

## 2019-12-01 DIAGNOSIS — M79645 Pain in left finger(s): Secondary | ICD-10-CM

## 2019-12-01 NOTE — ED Provider Notes (Signed)
[place]    CSN: [id number] Arrival date & time: 12/01/19  1004      History   Chief Complaint No chief complaint on file.   HPI Erin Dennis is a 39 y.o. female.   This visit was conducted over A/V media in accordance with Covid distancing guidelines. This NP, Faustino Congress was in the UC office at [facility] and the patient was in her own home. Instructed patient that if we cannot help her via video visit, she may need to come into be evaluated in person. Pt verbalized understanding and wishes to continue with the visit. Due to the nature of this visit, no physical exam was able to be performed.   Reports left finger pain and swelling for the last 3 days. Reports that she was helping her husband work on her lawnmower. She reports that she has been using ibuprofen and ice to help relieve pain and swelling with some relief. Reports that she does not think that it is broken. Has been buddy taping the finger to her first finger as well. Denies HA, SOB, ST, n/v/d, rash, fever, other symptoms.  ROS per HPI  The history is provided by the patient.    Past Medical History:  Diagnosis Date  . Darier disease     There are no problems to display for this patient.   Past Surgical History:  Procedure Laterality Date  . APPENDECTOMY    . CHOLECYSTECTOMY    . DILATION AND CURETTAGE OF UTERUS     for menorrhagia  . DILATION AND CURETTAGE OF UTERUS N/A 10/07/2019   Procedure: SUCTION DILATATION AND CURETTAGE;  Surgeon: [name], MD;  Location: [facility];  Service: Gynecology;  Laterality: N/A;  Dr. Request Time 9:30am  . OVARIAN CYST SURGERY     x3  . TONSILLECTOMY    . WISDOM TOOTH EXTRACTION      OB History    Gravida  2   Para      Term      Preterm      AB  2   Living        SAB  1   TAB      Ectopic  1   Multiple      Live Births               Home Medications    Prior to Admission medications   Medication Sig Start Date End  Date Taking? Authorizing Provider  escitalopram (LEXAPRO) 10 MG tablet Take 1 tablet (10 mg total) by mouth daily. 11/14/19   [name], MD  ibuprofen (ADVIL) 600 MG tablet Take 1 tablet (600 mg total) by mouth every 6 (six) hours as needed. Patient not taking: Reported on 11/14/2019 09/30/19   Jorje Guild, NP  LORazepam (ATIVAN) 0.5 MG tablet Take 0.5 mg by mouth as needed for anxiety.    [provider]  oxyCODONE-acetaminophen (PERCOCET) 5-325 MG tablet Take 1-2 tablets by mouth every 4 (four) hours as needed for severe pain. Patient not taking: Reported on 10/15/2019 10/07/19   [name], MD    Family History No family history on file.  Social History Social History   Tobacco Use  . Smoking status: Never Smoker  . Smokeless tobacco: Never Used  Substance Use Topics  . Alcohol use: Not Currently  . Drug use: Not Currently     Allergies   Acetaminophen and Sulfamethoxazole-trimethoprim   Review of Systems Review of Systems  Physical Exam Triage Vital Signs ED Triage Vitals  Enc Vitals Group     BP      Pulse      Resp      Temp      Temp src      SpO2      Weight      Height      Head Circumference      Peak Flow      Pain Score      Pain Loc      Pain Edu?      Excl. in [place]?    No data found.  Updated Vital Signs LMP 11/04/2019   Visual Acuity Right Eye Distance:   Left Eye Distance:   Bilateral Distance:    Right Eye Near:   Left Eye Near:    Bilateral Near:     Physical Exam Musculoskeletal:        General: Swelling present.     Comments: Swelling and bruising to L middle finger, visualized by camera this visit. Also has limited ROM in the L middle finger.      UC Treatments / Results  Labs (all labs ordered are listed, but only abnormal results are displayed) Labs Reviewed - No data to display  EKG   Radiology No results found.  Procedures Procedures (including critical care time)  Medications Ordered in  UC Medications - No data to display  Initial Impression / Assessment and Plan / UC Course  I have reviewed the triage vital signs and the nursing notes.  Pertinent labs & imaging results that were available during my care of the patient were reviewed by me and considered in my medical decision making (see chart for details).     L middle finger pain: Presents with L middle finger pain for the last 2 days. Was helping with working on a lawnmower and had a "jamming" injury. There is swelling present to the entire finger with some erythema and bruising. Unable to palpate due to VV. There is limited AROM. Discussed with patient the need for further evaluation in person. Discussed need for possible xray. Instructed that if she cannot come in, continue to buddy tape her finger and continue with ice and ibuprofen. Pt verbalizes understanding and is in agreement with treatment plan.  10 mins non face to face time provided for this encounter. Final Clinical Impressions(s) / UC Diagnoses   Final diagnoses:  Pain of left middle finger  Injury of finger of left hand, initial encounter     Discharge Instructions     Take the ibuprofen as prescribed.  Rest and elevate your hand.  Apply ice packs 2-3 times a day for up to 20 minutes each.    Follow up with your primary care provider or an orthopedist if you symptoms continue or worsen;  Or if you develop new symptoms, such as numbness, tingling, or weakness.    I would recommend that you come in the office to be evaluated in person and possibly get that finger x-rayed.    ED Prescriptions    None     PDMP not reviewed this encounter.   Faustino Congress, NP 12/01/19 1039

## 2019-12-01 NOTE — Discharge Instructions (Addendum)
Take the ibuprofen as prescribed.  Rest and elevate your hand.  Apply ice packs 2-3 times a day for up to 20 minutes each.    Follow up with your primary care provider or an orthopedist if you symptoms continue or worsen;  Or if you develop new symptoms, such as numbness, tingling, or weakness.    I would recommend that you come in the office to be evaluated in person and possibly get that finger x-rayed.

## 2019-12-06 ENCOUNTER — Ambulatory Visit (INDEPENDENT_AMBULATORY_CARE_PROVIDER_SITE_OTHER): Payer: 59 | Admitting: Obstetrics & Gynecology

## 2019-12-06 ENCOUNTER — Other Ambulatory Visit: Payer: Self-pay

## 2019-12-06 ENCOUNTER — Encounter: Payer: Self-pay | Admitting: Obstetrics & Gynecology

## 2019-12-06 VITALS — BP 126/83 | HR 61 | Ht 61.0 in | Wt 143.5 lb

## 2019-12-06 DIAGNOSIS — F5104 Psychophysiologic insomnia: Secondary | ICD-10-CM | POA: Diagnosis not present

## 2019-12-06 DIAGNOSIS — F329 Major depressive disorder, single episode, unspecified: Secondary | ICD-10-CM | POA: Diagnosis not present

## 2019-12-06 DIAGNOSIS — Z3202 Encounter for pregnancy test, result negative: Secondary | ICD-10-CM

## 2019-12-06 LAB — POCT URINE PREGNANCY: Preg Test, Ur: NEGATIVE

## 2019-12-06 MED ORDER — ESCITALOPRAM OXALATE 20 MG PO TABS: 20.0000 mg | ORAL_TABLET | Freq: Every day | ORAL | 11 refills | Status: DC

## 2019-12-06 MED ORDER — ZOLPIDEM TARTRATE 10 MG PO TABS: 10.0000 mg | ORAL_TABLET | Freq: Every evening | ORAL | 3 refills | Status: DC | PRN

## 2019-12-06 NOTE — Progress Notes (Signed)
Subjective:     Erin Dennis is a 39 y.o. female who presents for follow up of depression. Current symptoms include anhedonia, depressed mood, difficulty concentrating and insomnia. Symptoms have been gradually improving since that time. Patient denies recurrent thoughts of death, suicidal attempt and suicidal thoughts with specific plan. Previous treatment includes: Lexapro 10 mg daily. She complains of the following side effects from the treatment: none.  The following portions of the patient's history were reviewed and updated as appropriate: allergies, current medications, past family history, past medical history, past social history, past surgical history and problem list.  Review of Systems Pertinent items are noted in HPI.    Objective:    BP 126/83 (BP Location: Left Arm, Patient Position: Sitting, Cuff Size: Normal)   Pulse 61   Ht 5\' 1"  (1.549 m)   Wt 143 lb 8 oz (65.1 kg)   LMP 11/30/2019   BMI 27.11 kg/m   General:  alert, cooperative and no distress  Affect & Behavior:  full facial expressions, good grooming, good insight, normal perception, normal reasoning, normal speech pattern and content and normal thought patterns       Assessment:    Depression, gradually improving      Plan:    Follow up: 3 months. Spent 15 minutes (>50% of visit) discussing the risks of depression, the  pathophysiology, etiology, risks, and principles of treatment. Increase lexapro to 20 mg daily   Add ambien for improved sleep pattern/hygeine

## 2019-12-10 ENCOUNTER — Telehealth: Payer: Self-pay | Admitting: Obstetrics & Gynecology

## 2019-12-10 ENCOUNTER — Telehealth: Payer: Self-pay | Admitting: *Deleted

## 2019-12-10 NOTE — Telephone Encounter (Signed)
Patient informed forms were received after our office closed on Friday.  I have been unable to complete but will try to get them done tomorrow. Also made aware Dr Elonda Husky has been out of the office but will try to connect with him tomorrow to have him sign them.  Pt verbalized understanding.

## 2019-12-10 NOTE — Telephone Encounter (Signed)
Patient called stating that she cam einto the office on friday and spoke with Tish regarding her paperwork for her to continue to receive payment for her FMLA. Please contact pt

## 2019-12-11 ENCOUNTER — Encounter: Payer: Self-pay | Admitting: *Deleted

## 2020-01-03 ENCOUNTER — Other Ambulatory Visit: Payer: Self-pay

## 2020-01-03 ENCOUNTER — Ambulatory Visit (INDEPENDENT_AMBULATORY_CARE_PROVIDER_SITE_OTHER)
Admission: RE | Admit: 2020-01-03 | Discharge: 2020-01-03 | Disposition: A | Discharge status: Other Acute Inpatient Hospital | Payer: 59 | Source: Ambulatory Visit

## 2020-01-03 ENCOUNTER — Telehealth: Payer: Self-pay | Admitting: Emergency Medicine

## 2020-01-03 DIAGNOSIS — R21 Rash and other nonspecific skin eruption: Secondary | ICD-10-CM | POA: Diagnosis not present

## 2020-01-03 MED ORDER — PREDNISONE 10 MG (21) PO TBPK: ORAL_TABLET | Freq: Every day | ORAL | 0 refills | Status: DC

## 2020-01-03 NOTE — Telephone Encounter (Signed)
Patient states she changed her mind about being seen in person after going to the Urgent Care and seeing that there is a long wait.  She would like oral prednisone which was one of the treatment options we discussed earlier on her video visit.

## 2020-01-03 NOTE — ED Provider Notes (Addendum)
Virtual Visit via Video Note:  Erin Dennis  initiated request for Telemedicine visit with [facility] team. I connected with Erin Dennis  on 01/03/2020 at 2:57 PM  for a synchronized telemedicine visit using a video enabled HIPPA compliant telemedicine application. I verified that I am speaking with Erin Dennis  using two identifiers. Sharion Balloon, NP  was physically located in a [facility] Urgent care site and Tonya Wantz was located at a different location.   The limitations of evaluation and management by telemedicine as well as the availability of in-person appointments were discussed. Patient was informed that she  may incur a bill ( including co-pay) for this virtual visit encounter. Loralei Vowell  expressed understanding and gave verbal consent to proceed with virtual visit.     History of Present Illness:Erin Dennis  is a 39 y.o. female presents for evaluation of pruritic rash on left side of her face x 2 day.  The rash has gotten worse in the last day.  She has been working outside and thinks that she got into poison weeds.  No new products, foods, medicines.   She has some prednisone at home which is left over from previous [place] prescription; she took one 10 mg tablet this morning.  She denies difficulty swallowing or breathing.  She denies other symptoms such as fever, chills, sore throat, cough, n/v/d.    Allergies  Allergen Reactions  . Acetaminophen Anaphylaxis  . Sulfamethoxazole-Trimethoprim Other (See Comments)    Blisters     Past Medical History:  Diagnosis Date  . Darier disease      Social History   Tobacco Use  . Smoking status: Never Smoker  . Smokeless tobacco: Never Used  Substance Use Topics  . Alcohol use: Not Currently  . Drug use: Not Currently    ROS: as stated in HPI.  All other systems reviewed and negative.      Observations/Objective: Physical Exam  VITALS: Patient denies fever. GENERAL: Alert, appears well and  in no acute distress. HEENT: Atraumatic. NECK: Normal movements of the head and neck. CARDIOPULMONARY: No increased WOB. Speaking in clear sentences. I:E ratio WNL.  MS: Moves all visible extremities without noticeable abnormality. PSYCH: Pleasant and cooperative, well-groomed. Speech normal rate and rhythm. Affect is appropriate. Insight and judgement are appropriate. Attention is focused, linear, and appropriate.  NEURO: CN grossly intact. Oriented as arrived to appointment on time with no prompting. Moves both UE equally.  SKIN: Papular pink rash on left side of face.    Assessment and Plan:    ICD-10-CM   1. Rash  R21        Follow Up Instructions: Patient prefers to come for in person visit to receive a "steroid shot" for her rash.  Discussed coming to [facility] which is closest to her home.    Update: patient contacted UC and would like prescription for oral prednisone.  She declines to be seen in person due to wait time at [facility].  No difficulty swallowing or breathing.      I discussed the assessment and treatment plan with the patient. The patient was provided an opportunity to ask questions and all were answered. The patient agreed with the plan and demonstrated an understanding of the instructions.   The patient was advised to call back or seek an in-person evaluation if the symptoms worsen or if the condition fails to improve as anticipated.      Sharion Balloon, NP  01/03/2020 2:57 PM         Sharion Balloon, NP 01/03/20 [place], NP 01/03/20 [phone]

## 2020-01-03 NOTE — Discharge Instructions (Addendum)
Take the prednisone as directed.  Follow up with your primary care provider if your symptoms are not improving.    Go to the Emergency Department if you have difficulty swallowing or breathing.

## 2020-01-08 ENCOUNTER — Other Ambulatory Visit: Payer: Self-pay

## 2020-01-08 ENCOUNTER — Ambulatory Visit
Admission: EM | Admit: 2020-01-08 | Discharge: 2020-01-08 | Disposition: A | Discharge status: Home / Self Care | Payer: 59 | Attending: Emergency Medicine | Admitting: Emergency Medicine

## 2020-01-08 DIAGNOSIS — L239 Allergic contact dermatitis, unspecified cause: Secondary | ICD-10-CM | POA: Diagnosis not present

## 2020-01-08 MED ORDER — METHYLPREDNISOLONE SODIUM SUCC 125 MG IJ SOLR
125.0000 mg | Freq: Once | INTRAMUSCULAR | Status: AC
  Administered 2020-01-08: 125 mg via INTRAMUSCULAR

## 2020-01-08 MED ORDER — PREDNISONE 10 MG (21) PO TBPK: ORAL_TABLET | ORAL | 0 refills | Status: DC

## 2020-01-08 MED ORDER — METHYLPREDNISOLONE SODIUM SUCC 125 MG IJ SOLR: 125.0000 mg | Freq: Once | INTRAMUSCULAR | Status: DC

## 2020-01-08 MED ORDER — HYDROXYZINE HCL 25 MG PO TABS: 25.0000 mg | ORAL_TABLET | Freq: Four times a day (QID) | ORAL | 0 refills | Status: DC

## 2020-01-08 NOTE — ED Triage Notes (Signed)
Rash to left face after working in the yard last week. Pt took last dose of prednisone pack today, rash continuing to spread, now on both sides of face

## 2020-01-08 NOTE — ED Provider Notes (Signed)
[facility]    CSN: [id number] Arrival date & time: 01/08/20  1102      History   Chief Complaint Chief Complaint  Patient presents with  . Rash    HPI Erin Dennis is a 39 y.o. female.   With history of darier disease presents to the Urgent Care with a complaint of Rash for 5 to 7 days.  She denied change in soap, detergent, or anyone with similar symptoms.  She localized the rash to her face.  Described the rash as painful, itchy, red and spreading.  She has tried a week of steroid use OTC cortisone without relief.  Her symptoms are made worse with heat.  Denies similar symptoms in the past.  Denies chills, fever, nausea, vomiting, diarrhea.  The history is provided by the patient. No language interpreter was used.  Rash   Past Medical History:  Diagnosis Date  . Darier disease     There are no problems to display for this patient.   Past Surgical History:  Procedure Laterality Date  . APPENDECTOMY    . CHOLECYSTECTOMY    . DILATION AND CURETTAGE OF UTERUS     for menorrhagia  . DILATION AND CURETTAGE OF UTERUS N/A 10/07/2019   Procedure: SUCTION DILATATION AND CURETTAGE;  Surgeon: [name], MD;  Location: [facility];  Service: Gynecology;  Laterality: N/A;  Dr. Request Time 9:30am  . OVARIAN CYST SURGERY     x3  . TONSILLECTOMY    . WISDOM TOOTH EXTRACTION      OB History    Gravida  2   Para      Term      Preterm      AB  2   Living        SAB  1   TAB      Ectopic  1   Multiple      Live Births               Home Medications    Prior to Admission medications   Medication Sig Start Date End Date Taking? Authorizing Provider  escitalopram (LEXAPRO) 20 MG tablet Take 1 tablet (20 mg total) by mouth daily. 12/06/19   [name], MD  hydrOXYzine (ATARAX/VISTARIL) 25 MG tablet Take 1 tablet (25 mg total) by mouth every 6 (six) hours. 01/08/20   Nolan Lasser, Darrelyn Hillock, FNP  ibuprofen (ADVIL) 600 MG tablet Take 1 tablet (600 mg  total) by mouth every 6 (six) hours as needed. 09/30/19   Jorje Guild, NP  LORazepam (ATIVAN) 0.5 MG tablet Take 0.5 mg by mouth as needed for anxiety.    [provider]  predniSONE (STERAPRED UNI-PAK 21 TAB) 10 MG (21) TBPK tablet Take 6 tabs by mouth daily  for 1 day, then 5 tabs for 1 day, then 4 tabs for 1 day, then 3 tabs for 1 day, 2 tabs for 1 day, then 1 tab by mouth daily for 1 day 01/08/20   Vashaun Osmon, Darrelyn Hillock, FNP  zolpidem (AMBIEN) 10 MG tablet Take 1 tablet (10 mg total) by mouth at bedtime as needed for sleep. 12/06/19   [name], MD    Family History Family History  Problem Relation Age of Onset  . Healthy Mother     Social History Social History   Tobacco Use  . Smoking status: Never Smoker  . Smokeless tobacco: Never Used  Substance Use Topics  . Alcohol use: Not Currently  .  Drug use: Not Currently     Allergies   Acetaminophen, Sulfamethoxazole-trimethoprim, and Tape   Review of Systems Review of Systems  Constitutional: Negative.   Respiratory: Negative.   Cardiovascular: Negative.   Skin: Positive for color change and rash.  All other systems reviewed and are negative.    Physical Exam Triage Vital Signs ED Triage Vitals [01/08/20 1116]  Enc Vitals Group     BP 130/79     Pulse Rate 70     Resp 16     Temp 97.7 F (36.5 C)     Temp src      SpO2 96 %     Weight      Height      Head Circumference      Peak Flow      Pain Score 4     Pain Loc      Pain Edu?      Excl. in [place]?    No data found.  Updated Vital Signs BP 130/79   Pulse 70   Temp 97.7 F (36.5 C)   Resp 16   LMP 12/28/2019   SpO2 96%   Visual Acuity Right Eye Distance:   Left Eye Distance:   Bilateral Distance:    Right Eye Near:   Left Eye Near:    Bilateral Near:     Physical Exam Vitals and nursing note reviewed.  Constitutional:      General: She is not in acute distress.    Appearance: Normal appearance. She is normal weight. She is not  ill-appearing, toxic-appearing or diaphoretic.  Cardiovascular:     Rate and Rhythm: Normal rate.     Pulses: Normal pulses.     Heart sounds: Normal heart sounds. No murmur. No friction rub. No gallop.   Pulmonary:     Effort: Pulmonary effort is normal. No respiratory distress.     Breath sounds: Normal breath sounds. No stridor. No wheezing, rhonchi or rales.  Chest:     Chest wall: No tenderness.  Skin:    General: Skin is warm.     Capillary Refill: Capillary refill takes less than 2 seconds.     Findings: Rash present. Rash is macular. Rash is not crusting, scaling or vesicular.  Neurological:     Mental Status: She is alert.      UC Treatments / Results  Labs (all labs ordered are listed, but only abnormal results are displayed) Labs Reviewed - No data to display  EKG   Radiology No results found.  Procedures Procedures (including critical care time)  Medications Ordered in UC Medications  methylPREDNISolone sodium succinate (SOLU-MEDROL) 125 mg/2 mL injection 125 mg (125 mg Intramuscular Given 01/08/20 1145)    Initial Impression / Assessment and Plan / UC Course  I have reviewed the triage vital signs and the nursing notes.  Pertinent labs & imaging results that were available during my care of the patient were reviewed by me and considered in my medical decision making (see chart for details).   Patient is stable at discharge.  Symptom is likely from allergy dermatitis.  Will prescribe hydroxyzine and prednisone.  She was advised to follow-up with dermatologist if symptom does not resolve. Solu-Medrol IM was given in the office.   Final Clinical Impressions(s) / UC Diagnoses   Final diagnoses:  Allergic dermatitis     Discharge Instructions     Prescribed hydroxyzine and prednisone Take as prescribed and to completion Limit hot shower and baths,  or bathe with warm water.   Moisturize skin daily Follow up with PCP/dermatologist if symptoms  persists Return or go to the ER if you have any new or worsening symptoms .       ED Prescriptions    Medication Sig Dispense Auth. Provider   hydrOXYzine (ATARAX/VISTARIL) 25 MG tablet Take 1 tablet (25 mg total) by mouth every 6 (six) hours. 20 tablet Lucretia Pendley, Darrelyn Hillock, FNP   predniSONE (STERAPRED UNI-PAK 21 TAB) 10 MG (21) TBPK tablet Take 6 tabs by mouth daily  for 1 day, then 5 tabs for 1 day, then 4 tabs for 1 day, then 3 tabs for 1 day, 2 tabs for 1 day, then 1 tab by mouth daily for 1 day 21 tablet Viral Schramm, Darrelyn Hillock, FNP     PDMP not reviewed this encounter.   Emerson Monte, FNP 01/08/20 1147

## 2020-01-08 NOTE — Discharge Instructions (Addendum)
Prescribed hydroxyzine and prednisone Take as prescribed and to completion Limit hot shower and baths, or bathe with warm water.   Moisturize skin daily Follow up with PCP/dermatologist if symptoms persists Return or go to the ER if you have any new or worsening symptoms .

## 2020-01-20 ENCOUNTER — Telehealth: Payer: Self-pay | Admitting: Obstetrics & Gynecology

## 2020-01-20 MED ORDER — VENLAFAXINE HCL ER 75 MG PO CP24: 75.0000 mg | ORAL_CAPSULE | Freq: Every day | ORAL | 3 refills | Status: DC

## 2020-01-20 NOTE — Telephone Encounter (Signed)
Called patient back. She states at last visit Dr. Elonda Husky increased her Lexapro to 20 mg daily. She is having a hard time lately and is crying more than half of each day. She is supposed to go back to work tomorrow but doesn't feel like she is ready. 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 Patient emailed me form, advised we will work on it for her. Also requesting that Dr. Elonda Husky change her Lexapro.

## 2020-01-20 NOTE — Telephone Encounter (Signed)
Cheyanna called and needs to speak to a nurse or Dr Elonda Husky in Ref to her Lexapro/ she cant stop crying. Tammy asked me to send to [place] or [place].

## 2020-01-20 NOTE — Telephone Encounter (Signed)
See Rx note

## 2020-01-21 ENCOUNTER — Encounter: Payer: Self-pay | Admitting: Obstetrics & Gynecology

## 2020-02-12 DIAGNOSIS — Z3482 Encounter for supervision of other normal pregnancy, second trimester: Secondary | ICD-10-CM | POA: Diagnosis not present

## 2020-02-12 DIAGNOSIS — Z3483 Encounter for supervision of other normal pregnancy, third trimester: Secondary | ICD-10-CM | POA: Diagnosis not present

## 2020-03-02 ENCOUNTER — Other Ambulatory Visit: Payer: 59

## 2020-03-02 DIAGNOSIS — Z3490 Encounter for supervision of normal pregnancy, unspecified, unspecified trimester: Secondary | ICD-10-CM

## 2020-03-03 ENCOUNTER — Other Ambulatory Visit: Payer: Self-pay | Admitting: Obstetrics & Gynecology

## 2020-03-03 ENCOUNTER — Ambulatory Visit (INDEPENDENT_AMBULATORY_CARE_PROVIDER_SITE_OTHER): Payer: 59

## 2020-03-03 ENCOUNTER — Encounter: Payer: Self-pay | Admitting: Obstetrics & Gynecology

## 2020-03-03 ENCOUNTER — Encounter: Payer: Self-pay | Admitting: *Deleted

## 2020-03-03 ENCOUNTER — Ambulatory Visit (INDEPENDENT_AMBULATORY_CARE_PROVIDER_SITE_OTHER): Payer: 59 | Admitting: Obstetrics & Gynecology

## 2020-03-03 VITALS — BP 120/80 | HR 59 | Ht 61.0 in | Wt 141.0 lb

## 2020-03-03 DIAGNOSIS — Z3490 Encounter for supervision of normal pregnancy, unspecified, unspecified trimester: Secondary | ICD-10-CM

## 2020-03-03 DIAGNOSIS — Z3A01 Less than 8 weeks gestation of pregnancy: Secondary | ICD-10-CM

## 2020-03-03 DIAGNOSIS — Z331 Pregnant state, incidental: Secondary | ICD-10-CM

## 2020-03-03 DIAGNOSIS — Z1389 Encounter for screening for other disorder: Secondary | ICD-10-CM

## 2020-03-03 DIAGNOSIS — O3680X0 Pregnancy with inconclusive fetal viability, not applicable or unspecified: Secondary | ICD-10-CM

## 2020-03-03 LAB — POCT URINALYSIS DIPSTICK OB
Blood, UA: NEGATIVE
Ketones, UA: NEGATIVE
Leukocytes, UA: NEGATIVE
Nitrite, UA: NEGATIVE
POC,PROTEIN,UA: NEGATIVE

## 2020-03-03 LAB — PROGESTERONE: Progesterone: 32.8 ng/mL

## 2020-03-03 LAB — BETA HCG QUANT (REF LAB): hCG Quant: 32651 m[IU]/mL

## 2020-03-03 NOTE — Progress Notes (Signed)
Follow up appointment for results  Chief Complaint  Patient presents with  . Ultrasound Results    Blood pressure 120/80, pulse (!) 59, height 5\' 1"  (1.549 m), weight 141 lb (64 kg), last menstrual period 01/22/2020.  US OB Comp Less 14 Wks  Result Date: 03/03/2020 DATING AND VIABILITY SONOGRAM Erin Dennis is a 39 y.o. year old G3P0020 with LMP Patient's last menstrual period was 01/22/2020. which would correlate to  [redacted]w[redacted]d weeks gestation.  She has irregular menstrual cycles.   She is here today for a confirmatory initial sonogram. GESTATION: SINGLETON   FETAL ACTIVITY:          Heart rate         90 bpm       CERVIX: Appears closed ADNEXA: The ovaries are normal. GESTATIONAL AGE AND  BIOMETRICS: Gestational criteria: Estimated Date of Delivery: 10/28/20 by LMP now at [redacted]w[redacted]d Previous Scans:0    CROWN RUMP LENGTH           1.85 mm         5+5 weeks                                                                      AVERAGE EGA(BY THIS SCAN):  5+5 weeks WORKING EDD( LMP ):  10/28/2020  TECHNICIAN COMMENTS: [place] 5+6 wks,single IUP with YS,positive fht 90 bpm,CRL 1.85 mm,normal ovaries A copy of this report including all images has been saved and backed up to a second source for retrieval if needed. All measures and details of the anatomical scan, placentation, fluid volume and pelvic anatomy are contained in that report. [name] 03/03/2020 4:12 PM Clinical Impression and recommendations: I have reviewed the sonogram results above, combined with the patient's current clinical course, below are my impressions and any appropriate recommendations for management based on the sonographic findings. Viable early IUP, expected fetal bradycardia at this gestational age, will repeat scan in 10-14 days to ensure FHR progression G3P0020 Estimated Date of Delivery: 10/28/20 LMP, today's sonogram Normal general sonographic findings Recommend routine care unless otherwise clinically indicated This recommendation follows SRU  consensus guidelines: Diagnostic Criteria for Nonviable Pregnancy Early in the First Trimester. [employer] Med 2013; [id number]:6433-29. [name] 03/03/2020 4:43 PM     MEDS ordered this encounter: No orders of the defined types were placed in this encounter.   Orders for this encounter: Orders Placed This Encounter  Procedures  . POC Urinalysis Dipstick OB    Impression: 1.  Early pregnancy evaluation  Plan: Repeat scan in 13 days  Follow Up: Return in about 13 days (around 03/16/2020) for follow up sonogram 03/16/20 for repeat sonogram, in afternoon.       Face to face time:  10 minutes  Greater than 50% of the visit time was spent in counseling and coordination of care with the patient.  The summary and outline of the counseling and care coordination is summarized in the note above.   All questions were answered.  Past Medical History:  Diagnosis Date  . Darier disease     Past Surgical History:  Procedure Laterality Date  . APPENDECTOMY    . CHOLECYSTECTOMY    . DILATION AND CURETTAGE OF UTERUS  for menorrhagia  . DILATION AND CURETTAGE OF UTERUS N/A 10/07/2019   Procedure: SUCTION DILATATION AND CURETTAGE;  Surgeon: [name], MD;  Location: [facility];  Service: Gynecology;  Laterality: N/A;  Dr. Request Time 9:30am  . OVARIAN CYST SURGERY     x3  . TONSILLECTOMY    . WISDOM TOOTH EXTRACTION      OB History    Gravida  3   Para      Term      Preterm      AB  2   Living        SAB  1   TAB      Ectopic  1   Multiple      Live Births              Allergies  Allergen Reactions  . Acetaminophen Anaphylaxis  . Sulfamethoxazole-Trimethoprim Other (See Comments)    Blisters  . Tape Rash    Social History   Socioeconomic History  . Marital status: Married    Spouse name: Not on file  . Number of children: Not on file  . Years of education: Not on file  . Highest education level: Not on file  Occupational History  . Not on  file  Tobacco Use  . Smoking status: Never Smoker  . Smokeless tobacco: Never Used  Vaping Use  . Vaping Use: Never used  Substance and Sexual Activity  . Alcohol use: Not Currently  . Drug use: Not Currently  . Sexual activity: Yes    Birth control/protection: None  Other Topics Concern  . Not on file  Social History Narrative  . Not on file   Social Determinants of Health   Financial Resource Strain:   . Difficulty of Paying Living Expenses:   Food Insecurity:   . Worried About Charity fundraiser in the Last Year:   . Arboriculturist in the Last Year:   Transportation Needs:   . Film/video editor (Medical):   [name] Lack of Transportation (Non-Medical):   Physical Activity:   . Days of Exercise per Week:   . Minutes of Exercise per Session:   Stress:   . Feeling of Stress :   Social Connections:   . Frequency of Communication with Friends and Family:   . Frequency of Social Gatherings with Friends and Family:   . Attends Religious Services:   . Active Member of Clubs or Organizations:   . Attends Archivist Meetings:   [name] Marital Status:     Family History  Problem Relation Age of Onset  . Healthy Mother

## 2020-03-03 NOTE — Progress Notes (Signed)
[place] 5+6 wks,single IUP with YS,positive fht 90 bpm,CRL 1.85 mm,normal ovaries

## 2020-03-06 ENCOUNTER — Ambulatory Visit: Payer: 59 | Admitting: Obstetrics & Gynecology

## 2020-03-11 ENCOUNTER — Encounter: Payer: Self-pay | Admitting: *Deleted

## 2020-03-11 ENCOUNTER — Telehealth: Payer: Self-pay | Admitting: *Deleted

## 2020-03-11 ENCOUNTER — Other Ambulatory Visit: Payer: Self-pay | Admitting: Obstetrics & Gynecology

## 2020-03-11 DIAGNOSIS — O3680X0 Pregnancy with inconclusive fetal viability, not applicable or unspecified: Secondary | ICD-10-CM

## 2020-03-11 NOTE — Telephone Encounter (Signed)
Patient called stating that she and Dr Elonda Husky had been talking and he wanted her to have an ultrasound today.  I informed her that we did not have any openings available for work-in appts today that the ultrasonographer was already over booked and we had scheduled her for tomorrow.  Pt stated that was not going to work for her that since she had already been out of work today, she would not be able to be out tomorrow as well. I informed her there was not a reason at this time that she could not work and inquired why she was not able to work today and patient stated that she was "saturating a pad".   I advised that if she was bleeding that much, then she should go to [facility].  Patient stated ok.

## 2020-03-11 NOTE — Telephone Encounter (Signed)
Could you arrange for Erin Dennis to come in today for a sonogram?  Call her with the time etc.  I am not in the office today.  Thanks  Dr Elonda Husky

## 2020-03-12 ENCOUNTER — Telehealth: Payer: Self-pay | Admitting: *Deleted

## 2020-03-12 ENCOUNTER — Ambulatory Visit (INDEPENDENT_AMBULATORY_CARE_PROVIDER_SITE_OTHER): Payer: 59

## 2020-03-12 DIAGNOSIS — O3680X0 Pregnancy with inconclusive fetal viability, not applicable or unspecified: Secondary | ICD-10-CM

## 2020-03-12 NOTE — Progress Notes (Signed)
[place] 7+1 wks,measurements c/w dates,fhr 126 bpm,CRL 9.45 mm, normal ovaries

## 2020-03-12 NOTE — Telephone Encounter (Signed)
Pt had u/s today. States that Dr. Elonda Husky told her she would have ultrasounds every 2 weeks for this pregnancy. Advised we would check with him and schedule as he advises. Pt currently scheduled for new ob and ultrasound on 9/15.

## 2020-03-12 NOTE — Telephone Encounter (Signed)
Every 2 weeks through the first trimester, If I did not make that clear that is my error but I thought I communicated every 2 weeks up til 14 weeks.  Certainly there would be no indication to do them other than routinely beyond 14 weeks  Hope that clears things up and sorry if I was unclear

## 2020-03-18 ENCOUNTER — Other Ambulatory Visit: Payer: 59

## 2020-03-18 ENCOUNTER — Encounter: Payer: Self-pay | Admitting: Obstetrics and Gynecology

## 2020-03-18 ENCOUNTER — Other Ambulatory Visit: Payer: Self-pay

## 2020-03-18 ENCOUNTER — Ambulatory Visit (INDEPENDENT_AMBULATORY_CARE_PROVIDER_SITE_OTHER): Payer: 59 | Admitting: Obstetrics and Gynecology

## 2020-03-18 VITALS — BP 106/65 | HR 65 | Ht 61.0 in | Wt 141.0 lb

## 2020-03-18 DIAGNOSIS — O209 Hemorrhage in early pregnancy, unspecified: Secondary | ICD-10-CM

## 2020-03-18 NOTE — Patient Instructions (Signed)

## 2020-03-18 NOTE — Progress Notes (Signed)
PATIENT ID: Erin Dennis, female     DOB: 11-Sep-1980, 39 y.o.     MRN: [id number]    LOW-RISK PREGNANCY VISIT PATIENT NAME: Erin Dennis MRN [id number]  DOB: September 12, 1980  Chief Complaint:   Vaginal Discharge   History of Present Illness:   Erin Dennis is a 39 y.o. G39P0020 female at [redacted]w[redacted]d with an Estimated Date of Delivery: 10/28/20 being seen today for ongoing management of a low-risk pregnancy.   She notes a creamy brown discharge for 2 weeks. She states that it occasionally itches. She reports high levels of anxiety because her last pregnancy ended in a miscarriage in the first trimester and this discharge worries her.   Depression screen PHQ 2/9 12/06/2019  Decreased Interest 1  Down, Depressed, Hopeless 1  PHQ - 2 Score 2  Altered sleeping 3  Tired, decreased energy 2  Change in appetite 1  Feeling bad or failure about yourself  0  Trouble concentrating 0  Moving slowly or fidgety/restless 0  Suicidal thoughts 0  PHQ-9 Score 8   Today she reports vaginal irritation and vaginal discharge.  .  .   . denies leaking of fluid.  Review of Systems:   Pertinent items are noted in HPI Pt reports abnormal vaginal discharge w/ itching/irritation,  Denies headaches, visual changes, shortness of breath, chest pain, abdominal pain, severe nausea/vomiting, or problems with urination or bowel movements unless otherwise stated above.  03/12/2020 [place] 7+1 wks,measurements c/w dates,fhr 126 bpm,CRL 9.45 mm, normal ovaries  Pertinent History Reviewed:  Reviewed past medical,surgical, social, obstetrical and family history.  Reviewed problem list, medications and allergies.  Physical Assessment:   Vitals:   03/18/20 1404  BP: 106/65  Pulse: 65  Weight: 141 lb (64 kg)  Height: 5\' 1"  (1.549 m)  Body mass index is 26.64 kg/m.        Physical Examination:   General appearance: Well appearing, and in no distress  Mental status: Alert, oriented to person, place, and time  Skin: Warm &  dry  Cardiovascular: Normal heart rate noted  Respiratory: Normal respiratory effort, no distress  Abdomen: Soft, gravid, nontender   Baby: Fht visualized on bedside u/s    cm  Pelvic: Cervical exam performed  Watery, light bloody brown discharge originating at the cervix. Small [place] suspected on u/s        Extremities:    Fetal Status:        + fht  Chaperone: Biomedical scientist    No results found for this or any previous visit (from the past 24 hour(s)).  Assessment & Plan:  1) Low-risk pregnancy G3P0020 at [redacted]w[redacted]d with an Estimated Date of Delivery: 10/28/20   2) subchorionic hemorrhage with vaginal bleeding , old dark blood.and fluid.   Meds: No orders of the defined types were placed in this encounter.  Labs/procedures today: .POCt U/S TO IDENTIFY fht.  Plan:  Continue routine obstetrical care AS SCHEDULED Next visit: prefers in person     By signing my name below, I, [employer], attest that this documentation has been prepared under the direction and in the presence of Jonnie Kind, MD. Electronically Signed: [place]. 03/18/20. 2:33 PM.  I personally performed the services described in this documentation, which was SCRIBED in my presence. The recorded information has been reviewed and considered accurate. It has been edited as necessary during review. Jonnie Kind, MD

## 2020-03-23 LAB — OB RESULTS CONSOLE HEPATITIS B SURFACE ANTIGEN: Hepatitis B Surface Ag: NEGATIVE

## 2020-03-23 LAB — OB RESULTS CONSOLE RPR: RPR: NONREACTIVE

## 2020-03-23 LAB — OB RESULTS CONSOLE ABO/RH: RH Type: POSITIVE

## 2020-03-23 LAB — OB RESULTS CONSOLE ANTIBODY SCREEN: Antibody Screen: NEGATIVE

## 2020-03-23 LAB — OB RESULTS CONSOLE RUBELLA ANTIBODY, IGM: Rubella: IMMUNE

## 2020-03-23 LAB — HEPATITIS C ANTIBODY: HCV Ab: NEGATIVE

## 2020-03-23 LAB — OB RESULTS CONSOLE HIV ANTIBODY (ROUTINE TESTING): HIV: NONREACTIVE

## 2020-03-27 ENCOUNTER — Other Ambulatory Visit: Payer: 59

## 2020-04-01 ENCOUNTER — Other Ambulatory Visit: Payer: Self-pay | Admitting: Obstetrics & Gynecology

## 2020-04-01 DIAGNOSIS — O3680X0 Pregnancy with inconclusive fetal viability, not applicable or unspecified: Secondary | ICD-10-CM

## 2020-04-02 ENCOUNTER — Inpatient Hospital Stay (HOSPITAL_COMMUNITY): Admission: RE | Admit: 2020-04-02 | Payer: 59 | Source: Home / Self Care

## 2020-04-02 ENCOUNTER — Ambulatory Visit (INDEPENDENT_AMBULATORY_CARE_PROVIDER_SITE_OTHER): Payer: Medicaid Other

## 2020-04-02 DIAGNOSIS — O3680X0 Pregnancy with inconclusive fetal viability, not applicable or unspecified: Secondary | ICD-10-CM | POA: Diagnosis not present

## 2020-04-02 DIAGNOSIS — Z3A10 10 weeks gestation of pregnancy: Secondary | ICD-10-CM

## 2020-04-02 NOTE — Progress Notes (Signed)
[place] 10+1 wks,measurements c/w dates,crl 35.30 mm,fhr 153 BPM,normal ovaries

## 2020-04-03 ENCOUNTER — Telehealth: Payer: Self-pay | Admitting: *Deleted

## 2020-04-03 NOTE — Telephone Encounter (Signed)
Patient in office for ultrasound and was accompanied by her mother.  When checking out, I explained to her that we noticed in [place] that she had went to the appointment at [facility] on 8/23. At her last visit with [place], she stated she was not going to them for OB care and was only seeing them for her acne.  I had discussed with Dr Elonda Husky prior to her arrival via text, that she had went to that appointment and had been dishonest with our office and we did not appreciate that.  In his message he stated "I would ask her what her intentions are and to make her aware that Epic via Care everywhere keeps an ongoing record of all of her visits no matter the facility. It is not reasonable to receive care at both placed and if it is her intention to change her care there at some point due to moving, etc we can see her until she moves or intends to change care.  Bottom line is she needs prenatal care but not double simultaneous prenatal care. She cannot be receiving simultaneous care at both facilities."  Patient stated that she only went to them for her acne and they "must have documented the wrong thing and mis scheduled her next appointment. She then went on to say that Dr Lavella Hammock was treating her and was doing monthly labs on her to check her liver enzymes due to the fact that she had been on Accutane in the past.  Informed in the note by the OBGYN at [facility] it was documented she was there for OB care and had prenatal labs drawn.  I explained to her that I doubted that it was a documentation and scheduling error and we just asked that she be honest with our office.  We would be happy to see her, however, she would need to decide where she wanted care.  We were also going to cancel future appointments until she decided what she wanted to do. Patient agreeable to this.    Patient called our office about 15 minutes after leaving asking to speak with Daisy or myself. Patient stated she wanted to schedule her next visits with [place]  as she had called and "spoke with Dr Lavella Hammock and told them they had scheduled her next visit with them incorrectly and it was messing up her being able to have care with [place]". I then asked "you spoke with Dr Lavella Hammock directly?" and she stated "yes".   I informed her that we would need to call her back in regards to scheduling her.     Patient called back about 20 minutes later stating that she had not received a call back from [place] to get her scheduled for her next appointments.  I informed her that I needed to discuss this with Dr Elonda Husky as what she was stating did not correlate with what was documented in her chart regarding her visits at [facility].  I expressed that we did not appreciate her dishonesty as Dr Elonda Husky had gone above and beyond for her as she was having ultrasounds that were not medically necessary and was only being done to give her a piece of mind.  I explained that we had other patients who were truly high risk and in need of ultrasounds and she was taking advantage of this.  At this time, she has no pre-existing conditions that would warrant her as being high risk and would just need routine prenatal care.  Patient continue  to state that she saw Dr Lavella Hammock at this last visit and was seen for her skin condition.  I then asked her if she had seen an OBGYN on 8/23 and she stated "no".  I asked if she had an ultrasound at that visit and she stated "yes, they wanted to do one to check on the baby".  I did make her aware that I could see where she had seen Dr Lavella Hammock in the past along with Dr Gilberto Better most recently on 01/13/20 however, the noted on 8/23 was documented by Dr Doyle Askew and there was no mention of her being there for treatment of her skin condition.  Patient stated she wanted to have care with [place] as "we had been so good to her and she did not want to drive 2 hours for care.  I explained to her that we would see if but her appointments with them needed to be cancelled.  I also made her aware that the only  ultrasounds from this point forward that she would have would only be done if medically necessary ie, NT/IT and anatomy scan.  I explained that if she became high risk later on in the pregnancy, then she may have more ultrasounds however, at this time, she would only have 2 more.  I also reiterated that she should not call with complaints of bleeding so that she would be ordered an ultrasound. I also made her aware, that every encounter with her would be documented for future reference.    At the end of our conversation, patient verbalized understanding stated she would cancel the next visit with them and would only see [place].     All conversations were witnessed by Ledell Peoples, Gloris Manchester, Wells Guiles, CNM

## 2020-04-10 ENCOUNTER — Other Ambulatory Visit: Payer: 59

## 2020-04-14 ENCOUNTER — Other Ambulatory Visit: Payer: Self-pay | Admitting: Obstetrics & Gynecology

## 2020-04-14 DIAGNOSIS — Z3682 Encounter for antenatal screening for nuchal translucency: Secondary | ICD-10-CM

## 2020-04-15 ENCOUNTER — Encounter: Payer: Self-pay | Admitting: *Deleted

## 2020-04-15 ENCOUNTER — Ambulatory Visit: Payer: Medicaid Other | Admitting: *Deleted

## 2020-04-15 ENCOUNTER — Ambulatory Visit: Payer: 59 | Admitting: *Deleted

## 2020-04-15 ENCOUNTER — Other Ambulatory Visit: Payer: 59

## 2020-04-15 ENCOUNTER — Ambulatory Visit (INDEPENDENT_AMBULATORY_CARE_PROVIDER_SITE_OTHER): Payer: Medicaid Other | Admitting: Advanced Practice Midwife

## 2020-04-15 ENCOUNTER — Encounter: Payer: 59 | Admitting: Women's Health

## 2020-04-15 ENCOUNTER — Encounter: Payer: Self-pay | Admitting: Advanced Practice Midwife

## 2020-04-15 ENCOUNTER — Other Ambulatory Visit (HOSPITAL_COMMUNITY)
Admission: RE | Admit: 2020-04-15 | Discharge: 2020-04-15 | Disposition: A | Discharge status: Home / Self Care | Payer: Medicaid Other | Source: Ambulatory Visit | Attending: Advanced Practice Midwife | Admitting: Advanced Practice Midwife

## 2020-04-15 ENCOUNTER — Ambulatory Visit (INDEPENDENT_AMBULATORY_CARE_PROVIDER_SITE_OTHER): Payer: Medicaid Other

## 2020-04-15 VITALS — BP 105/73 | HR 67 | Wt 140.0 lb

## 2020-04-15 DIAGNOSIS — Z3480 Encounter for supervision of other normal pregnancy, unspecified trimester: Secondary | ICD-10-CM

## 2020-04-15 DIAGNOSIS — Z3A12 12 weeks gestation of pregnancy: Secondary | ICD-10-CM | POA: Diagnosis present

## 2020-04-15 DIAGNOSIS — Z1389 Encounter for screening for other disorder: Secondary | ICD-10-CM

## 2020-04-15 DIAGNOSIS — O0990 Supervision of high risk pregnancy, unspecified, unspecified trimester: Secondary | ICD-10-CM | POA: Insufficient documentation

## 2020-04-15 DIAGNOSIS — Z3682 Encounter for antenatal screening for nuchal translucency: Secondary | ICD-10-CM | POA: Diagnosis not present

## 2020-04-15 DIAGNOSIS — Z331 Pregnant state, incidental: Secondary | ICD-10-CM

## 2020-04-15 DIAGNOSIS — N96 Recurrent pregnancy loss: Secondary | ICD-10-CM

## 2020-04-15 MED ORDER — BLOOD PRESSURE MONITOR MISC: 0 refills | Status: DC

## 2020-04-15 NOTE — Progress Notes (Signed)
   PRENATAL VISIT NOTE  Subjective:  Erin Dennis is a 39 y.o. G3P0020 at [redacted]w[redacted]d being seen today for ongoing prenatal care.  She is currently monitored for the following issues for this low-risk pregnancy and has Depression and Encounter for supervision of normal pregnancy, antepartum on their problem list.  Patient reports nausea and occasional intermittent cramping which resolves with rest.  Contractions: Not present. Vag. Bleeding: None.  Movement: Absent. Denies leaking of fluid.   The following portions of the patient's history were reviewed and updated as appropriate: allergies, current medications, past family history, past medical history, past social history, past surgical history and problem list. Problem list updated.  Objective:   Vitals:   04/15/20 1400  BP: 105/73  Pulse: 67  Weight: 140 lb (63.5 kg)    Fetal Status:     Movement: Absent     General:  Alert, oriented and cooperative. Patient is in no acute distress.  Skin: Skin is warm and dry. No rash noted.   Cardiovascular: Normal heart rate noted  Respiratory: Normal respiratory effort, no problems with respiration noted  Abdomen: Soft, gravid, appropriate for gestational age.  Pain/Pressure: Absent     Pelvic: Cervical exam deferred        Extremities: Normal range of motion.  Edema: None  Mental Status: Normal mood and affect. Normal behavior. Normal judgment and thought content.   Assessment and Plan:  Pregnancy: G3P0020 at [redacted]w[redacted]d  1. Supervision of other normal pregnancy, antepartum - S/p new ob in [place] - Cervix visually closed on pap - Integrated 1 - Genetic Screening - Blood Pressure Monitor MISC; For regular home bp monitoring during pregnancy (Patient not taking: Reported on 04/15/2020)  Dispense: 1 each; Refill: 0 - [facility] AMB BABYSCRIPTS SCHEDULE OPTIMIZATION - Cytology - PAP - POC Urinalysis Dipstick OB   2. Recurrent pregnancy loss - Serial ultrasound q 2 weeks through [redacted] weeks GA per Dr. Elonda Husky  note  Preterm labor symptoms and general obstetric precautions including but not limited to vaginal bleeding, contractions, leaking of fluid and fetal movement were reviewed in detail with the patient. Please refer to After Visit Summary for other counseling recommendations.  Return in about 4 weeks (around 05/13/2020).  Future Appointments  Date Time Provider [place]  05/06/2020  3:30 PM [facility] [place] [facility] None  05/13/2020  8:30 AM Myrtis Ser, CNM [facility] [place], CNM

## 2020-04-15 NOTE — Progress Notes (Signed)
[place] 12 wks,measurements c/w dates,crl 61.64 mm,NB present,NT 1.2 mm,fhr 165 bpm,anterior placenta gr 0,normal ovaries

## 2020-04-15 NOTE — Patient Instructions (Signed)

## 2020-04-17 LAB — INTEGRATED 1
Crown Rump Length: 61.6 mm
Gest. Age on Collection Date: 12.4 weeks
Maternal Age at EDD: 38.5 yr
Nuchal Translucency (NT): 1.2 mm
Number of Fetuses: 1
PAPP-A Value: 1521 ng/mL
Weight: 140 [lb_av]

## 2020-04-17 LAB — CYTOLOGY - PAP
Adequacy: ABSENT
Chlamydia: NEGATIVE
Comment: NEGATIVE
Comment: NEGATIVE
Comment: NORMAL
Diagnosis: NEGATIVE
High risk HPV: NEGATIVE
Neisseria Gonorrhea: NEGATIVE

## 2020-05-05 ENCOUNTER — Other Ambulatory Visit: Payer: Self-pay | Admitting: Obstetrics & Gynecology

## 2020-05-05 DIAGNOSIS — Z8759 Personal history of other complications of pregnancy, childbirth and the puerperium: Secondary | ICD-10-CM

## 2020-05-06 ENCOUNTER — Ambulatory Visit (INDEPENDENT_AMBULATORY_CARE_PROVIDER_SITE_OTHER): Payer: Medicaid Other

## 2020-05-06 ENCOUNTER — Other Ambulatory Visit: Payer: Self-pay

## 2020-05-06 DIAGNOSIS — Z8759 Personal history of other complications of pregnancy, childbirth and the puerperium: Secondary | ICD-10-CM | POA: Diagnosis not present

## 2020-05-06 DIAGNOSIS — Z3A15 15 weeks gestation of pregnancy: Secondary | ICD-10-CM

## 2020-05-06 DIAGNOSIS — Z3480 Encounter for supervision of other normal pregnancy, unspecified trimester: Secondary | ICD-10-CM

## 2020-05-06 NOTE — Progress Notes (Signed)
[place] 15 wks,measurements c/w dates,fhr 133 bpm,anterior placenta gr 0,normal ovaries,svp of fluid 3.3 cm,EFW 123 g 65%

## 2020-05-07 ENCOUNTER — Telehealth: Payer: Self-pay

## 2020-05-07 NOTE — Telephone Encounter (Signed)
Pt asking if she can take sudafed for her allergies or what she can take for her allergies .

## 2020-05-13 ENCOUNTER — Ambulatory Visit (INDEPENDENT_AMBULATORY_CARE_PROVIDER_SITE_OTHER): Payer: Medicaid Other | Admitting: Advanced Practice Midwife

## 2020-05-13 ENCOUNTER — Other Ambulatory Visit: Payer: Self-pay

## 2020-05-13 VITALS — BP 115/73 | HR 65 | Wt 144.0 lb

## 2020-05-13 DIAGNOSIS — Z363 Encounter for antenatal screening for malformations: Secondary | ICD-10-CM

## 2020-05-13 DIAGNOSIS — Z3480 Encounter for supervision of other normal pregnancy, unspecified trimester: Secondary | ICD-10-CM

## 2020-05-13 DIAGNOSIS — Z3A16 16 weeks gestation of pregnancy: Secondary | ICD-10-CM

## 2020-05-13 DIAGNOSIS — O09512 Supervision of elderly primigravida, second trimester: Secondary | ICD-10-CM

## 2020-05-13 DIAGNOSIS — O09519 Supervision of elderly primigravida, unspecified trimester: Secondary | ICD-10-CM | POA: Insufficient documentation

## 2020-05-13 DIAGNOSIS — Z1389 Encounter for screening for other disorder: Secondary | ICD-10-CM

## 2020-05-13 DIAGNOSIS — Z331 Pregnant state, incidental: Secondary | ICD-10-CM

## 2020-05-13 LAB — POCT URINALYSIS DIPSTICK OB
Blood, UA: NEGATIVE
Glucose, UA: NEGATIVE
Ketones, UA: NEGATIVE
Leukocytes, UA: NEGATIVE
Nitrite, UA: NEGATIVE
POC,PROTEIN,UA: NEGATIVE

## 2020-05-13 NOTE — Patient Instructions (Signed)
Erin Dennis, I greatly value your feedback.  If you receive a survey following your visit with [place] today, we appreciate you taking the time to fill it out.  Thanks, Derrill Memo, [place] at [facility] ([place], [place] [address]) Entrance C, located off of [address] parking  Go to [email or web address] to register for FREE online childbirth classes  [place] Pediatricians/Family Doctors:  [facility] [phone]                 [place] [phone] (usually not accepting new patients unless you have family there already, you are always welcome to call and ask)       [facility] Department [phone]       [facility] Pediatricians/Family Doctors:   [facility]: [phone]  [facility]: [phone]  [facility]: [place] Doctors:   [facility]: [facility]: [place]:  [place]: [phone]    Home Blood Pressure Monitoring for Patients   Your provider has recommended that you check your blood pressure (BP) at least once a week at home. If you do not have a blood pressure cuff at home, one will be provided for you. Contact your provider if you have not received your monitor within 1 week.   Helpful Tips for Accurate Home Blood Pressure Checks  . Don't smoke, exercise, or drink caffeine 30 minutes before checking your BP . Use the restroom before checking your BP (a full bladder can raise your pressure) . Relax in a comfortable upright chair . Feet on the ground . Left arm resting comfortably on a flat surface at the level of your heart . Legs uncrossed . Back supported . Sit quietly and don't talk . Place the cuff on your bare arm . Adjust snuggly, so that only two  fingertips can fit between your skin and the top of the cuff . Check 2 readings separated by at least one minute . Keep a log of your BP readings . For a visual, please reference this diagram: http://ccnc.care/bpdiagram  Provider Name: [facility]     Phone: [phone]  Zone 1: ALL CLEAR  Continue to monitor your symptoms:  . BP reading is less than 140 (top number) or less than 90 (bottom number)  . No right upper stomach pain . No headaches or seeing spots . No feeling nauseated or throwing up . No swelling in face and hands  Zone 2: CAUTION Call your doctor's office for any of the following:  . BP reading is greater than 140 (top number) or greater than 90 (bottom number)  . Stomach pain under your ribs in the middle or right side . Headaches or seeing spots . Feeling nauseated or throwing up . Swelling in face and hands  Zone 3: EMERGENCY  Seek immediate medical care if you have any of the following:  . BP reading is greater than160 (top number) or greater than 110 (bottom number) . Severe headaches not improving with Tylenol . Serious difficulty catching your breath . Any worsening symptoms from Zone 2     Second Trimester of Pregnancy The second trimester is from week 14 through week 27 (months 4 through 6). The second trimester is often a time when you feel your best. Your  body has adjusted to being pregnant, and you begin to feel better physically. Usually, morning sickness has lessened or quit completely, you may have more energy, and you may have an increase in appetite. The second trimester is also a time when the fetus is growing rapidly. At the end of the sixth month, the fetus is about 9 inches long and weighs about 1 pounds. You will likely begin to feel the baby move (quickening) between 16 and 20 weeks of pregnancy. Body changes during your second trimester Your body continues to go through many changes during your second trimester. The changes vary from  woman to woman.  Your weight will continue to increase. You will notice your lower abdomen bulging out.  You may begin to get stretch marks on your hips, abdomen, and breasts.  You may develop headaches that can be relieved by medicines. The medicines should be approved by your health care provider.  You may urinate more often because the fetus is pressing on your bladder.  You may develop or continue to have heartburn as a result of your pregnancy.  You may develop constipation because certain hormones are causing the muscles that push waste through your intestines to slow down.  You may develop hemorrhoids or swollen, bulging veins (varicose veins).  You may have back pain. This is caused by: ? Weight gain. ? Pregnancy hormones that are relaxing the joints in your pelvis. ? A shift in weight and the muscles that support your balance.  Your breasts will continue to grow and they will continue to become tender.  Your gums may bleed and may be sensitive to brushing and flossing.  Dark spots or blotches (chloasma, mask of pregnancy) may develop on your face. This will likely fade after the baby is born.  A dark line from your belly button to the pubic area (linea nigra) may appear. This will likely fade after the baby is born.  You may have changes in your hair. These can include thickening of your hair, rapid growth, and changes in texture. Some women also have hair loss during or after pregnancy, or hair that feels dry or thin. Your hair will most likely return to normal after your baby is born.  What to expect at prenatal visits During a routine prenatal visit:  You will be weighed to make sure you and the fetus are growing normally.  Your blood pressure will be taken.  Your abdomen will be measured to track your baby's growth.  The fetal heartbeat will be listened to.  Any test results from the previous visit will be discussed.  Your health care provider may ask  you:  How you are feeling.  If you are feeling the baby move.  If you have had any abnormal symptoms, such as leaking fluid, bleeding, severe headaches, or abdominal cramping.  If you are using any tobacco products, including cigarettes, chewing tobacco, and electronic cigarettes.  If you have any questions.  Other tests that may be performed during your second trimester include:  Blood tests that check for: ? Low iron levels (anemia). ? High blood sugar that affects pregnant women (gestational diabetes) between 64 and 28 weeks. ? Rh antibodies. This is to check for a protein on red blood cells (Rh factor).  Urine tests to check for infections, diabetes, or protein in the urine.  An ultrasound to confirm the proper growth and development of the baby.  An amniocentesis to check for possible genetic problems.  Fetal screens for  spina bifida and Down syndrome.  HIV (human immunodeficiency virus) testing. Routine prenatal testing includes screening for HIV, unless you choose not to have this test.  Follow these instructions at home: Medicines  Follow your health care provider's instructions regarding medicine use. Specific medicines may be either safe or unsafe to take during pregnancy.  Take a prenatal vitamin that contains at least 600 micrograms (mcg) of folic acid.  If you develop constipation, try taking a stool softener if your health care provider approves. Eating and drinking  Eat a balanced diet that includes fresh fruits and vegetables, whole grains, good sources of protein such as meat, eggs, or tofu, and low-fat dairy. Your health care provider will help you determine the amount of weight gain that is right for you.  Avoid raw meat and uncooked cheese. These carry germs that can cause birth defects in the baby.  If you have low calcium intake from food, talk to your health care provider about whether you should take a daily calcium supplement.  Limit foods that  are high in fat and processed sugars, such as fried and sweet foods.  To prevent constipation: ? Drink enough fluid to keep your urine clear or pale yellow. ? Eat foods that are high in fiber, such as fresh fruits and vegetables, whole grains, and beans. Activity  Exercise only as directed by your health care provider. Most women can continue their usual exercise routine during pregnancy. Try to exercise for 30 minutes at least 5 days a week. Stop exercising if you experience uterine contractions.  Avoid heavy lifting, wear low heel shoes, and practice good posture.  A sexual relationship may be continued unless your health care provider directs you otherwise. Relieving pain and discomfort  Wear a good support bra to prevent discomfort from breast tenderness.  Take warm sitz baths to soothe any pain or discomfort caused by hemorrhoids. Use hemorrhoid cream if your health care provider approves.  Rest with your legs elevated if you have leg cramps or low back pain.  If you develop varicose veins, wear support hose. Elevate your feet for 15 minutes, 3-4 times a day. Limit salt in your diet. Prenatal Care  Write down your questions. Take them to your prenatal visits.  Keep all your prenatal visits as told by your health care provider. This is important. Safety  Wear your seat belt at all times when driving.  Make a list of emergency phone numbers, including numbers for family, friends, the hospital, and police and fire departments. General instructions  Ask your health care provider for a referral to a local prenatal education class. Begin classes no later than the beginning of month 6 of your pregnancy.  Ask for help if you have counseling or nutritional needs during pregnancy. Your health care provider can offer advice or refer you to specialists for help with various needs.  Do not use hot tubs, steam rooms, or saunas.  Do not douche or use tampons or scented sanitary  pads.  Do not cross your legs for long periods of time.  Avoid cat litter boxes and soil used by cats. These carry germs that can cause birth defects in the baby and possibly loss of the fetus by miscarriage or stillbirth.  Avoid all smoking, herbs, alcohol, and unprescribed drugs. Chemicals in these products can affect the formation and growth of the baby.  Do not use any products that contain nicotine or tobacco, such as cigarettes and e-cigarettes. If you need help quitting, ask  your health care provider.  Visit your dentist if you have not gone yet during your pregnancy. Use a soft toothbrush to brush your teeth and be gentle when you floss. Contact a health care provider if:  You have dizziness.  You have mild pelvic cramps, pelvic pressure, or nagging pain in the abdominal area.  You have persistent nausea, vomiting, or diarrhea.  You have a bad smelling vaginal discharge.  You have pain when you urinate. Get help right away if:  You have a fever.  You are leaking fluid from your vagina.  You have spotting or bleeding from your vagina.  You have severe abdominal cramping or pain.  You have rapid weight gain or weight loss.  You have shortness of breath with chest pain.  You notice sudden or extreme swelling of your face, hands, ankles, feet, or legs.  You have not felt your baby move in over an hour.  You have severe headaches that do not go away when you take medicine.  You have vision changes. Summary  The second trimester is from week 14 through week 27 (months 4 through 6). It is also a time when the fetus is growing rapidly.  Your body goes through many changes during pregnancy. The changes vary from woman to woman.  Avoid all smoking, herbs, alcohol, and unprescribed drugs. These chemicals affect the formation and growth your baby.  Do not use any tobacco products, such as cigarettes, chewing tobacco, and e-cigarettes. If you need help quitting, ask your  health care provider.  Contact your health care provider if you have any questions. Keep all prenatal visits as told by your health care provider. This is important. This information is not intended to replace advice given to you by your health care provider. Make sure you discuss any questions you have with your health care provider. Document Released: 07/12/2001 Document Revised: 12/24/2015 Document Reviewed: 09/18/2012 Elsevier Interactive Patient Education  2017 [employer].

## 2020-05-13 NOTE — Progress Notes (Signed)
   LOW-RISK PREGNANCY VISIT Patient name: Erin Dennis MRN [id number]  Date of birth: Sep 01, 1980 Chief Complaint:   Routine Prenatal Visit  History of Present Illness:   Erin Dennis is a 39 y.o. G57P0020 female at [redacted]w[redacted]d with an Estimated Date of Delivery: 10/28/20 being seen today for ongoing management of a low-risk pregnancy.  Today she reports having intermittent pain near her ankle. Contractions: Not present. Vag. Bleeding: None.   . denies leaking of fluid. Review of Systems:   Pertinent items are noted in HPI Denies abnormal vaginal discharge w/ itching/odor/irritation, headaches, visual changes, shortness of breath, chest pain, abdominal pain, severe nausea/vomiting, or problems with urination or bowel movements unless otherwise stated above. Pertinent History Reviewed:  Reviewed past medical,surgical, social, obstetrical and family history.  Reviewed problem list, medications and allergies. Physical Assessment:   Vitals:   05/13/20 0833  BP: 115/73  Pulse: 65  Weight: 144 lb (65.3 kg)  Body mass index is 27.21 kg/m.        Physical Examination:   General appearance: Well appearing, and in no distress  Mental status: Alert, oriented to person, place, and time  Skin: Warm & dry  Cardiovascular: Normal heart rate noted  Respiratory: Normal respiratory effort, no distress  Abdomen: Soft, gravid, nontender  Pelvic: Cervical exam deferred         Extremities:    Fetal Status: Fetal Heart Rate (bpm): 143        Results for orders placed or performed in visit on 05/13/20 (from the past 24 hour(s))  POC Urinalysis Dipstick OB   Collection Time: 05/13/20  8:33 AM  Result Value Ref Range   Color, UA     Clarity, UA     Glucose, UA Negative Negative   Bilirubin, UA     Ketones, UA neg    Spec Grav, UA     Blood, UA neg    pH, UA     POC,PROTEIN,UA Negative Negative, Trace, Small (1+), Moderate (2+), Large (3+), 4+   Urobilinogen, UA     Nitrite, UA neg    Leukocytes,  UA Negative Negative   Appearance     Odor      Assessment & Plan:  1) Low-risk pregnancy G3P0020 at [redacted]w[redacted]d with an Estimated Date of Delivery: 10/28/20; reviewed results of NOB labs  2) AMA, <40yo at delivery  3) Intermittent foot pain, rec see PCP if becomes bothersome   Meds: No orders of the defined types were placed in this encounter.  Labs/procedures today: 2nd IT (declines Horizon)  Plan:  Continue routine obstetrical care with anatomy u/s at next visit  Reviewed: Preterm labor symptoms and general obstetric precautions including but not limited to vaginal bleeding, contractions, leaking of fluid and fetal movement were reviewed in detail with the patient.  All questions were answered. Didn't ask about home bp cuff. Check bp weekly, let us know if >140/90.   Follow-up: Return for As scheduled.  Orders Placed This Encounter  Procedures  . US OB Comp + 14 Wk  . INTEGRATED 2  . POC Urinalysis Dipstick OB   [facility] 05/13/2020 9:14 AM

## 2020-05-15 LAB — INTEGRATED 2
AFP MoM: 1.04
Alpha-Fetoprotein: 36.5 ng/mL
Crown Rump Length: 61.6 mm
DIA MoM: 1.68
DIA Value: 276.7 pg/mL
Estriol, Unconjugated: 1.55 ng/mL
Gest. Age on Collection Date: 12.4 weeks
Gestational Age: 16.4 weeks
Maternal Age at EDD: 38.5 yr
Nuchal Translucency (NT): 1.2 mm
Nuchal Translucency MoM: 0.87
Number of Fetuses: 1
PAPP-A MoM: 1.44
PAPP-A Value: 1521 ng/mL
Test Results:: NEGATIVE
Weight: 140 [lb_av]
Weight: 140 [lb_av]
hCG MoM: 0.92
hCG Value: 31.8 IU/mL
uE3 MoM: 1.48

## 2020-06-03 ENCOUNTER — Ambulatory Visit (INDEPENDENT_AMBULATORY_CARE_PROVIDER_SITE_OTHER): Payer: Medicaid Other | Admitting: Advanced Practice Midwife

## 2020-06-03 ENCOUNTER — Ambulatory Visit (INDEPENDENT_AMBULATORY_CARE_PROVIDER_SITE_OTHER): Payer: Medicaid Other

## 2020-06-03 VITALS — BP 95/63 | HR 66 | Wt 144.4 lb

## 2020-06-03 DIAGNOSIS — O09512 Supervision of elderly primigravida, second trimester: Secondary | ICD-10-CM

## 2020-06-03 DIAGNOSIS — Z1389 Encounter for screening for other disorder: Secondary | ICD-10-CM

## 2020-06-03 DIAGNOSIS — Z331 Pregnant state, incidental: Secondary | ICD-10-CM

## 2020-06-03 DIAGNOSIS — Z302 Encounter for sterilization: Secondary | ICD-10-CM

## 2020-06-03 DIAGNOSIS — Z3480 Encounter for supervision of other normal pregnancy, unspecified trimester: Secondary | ICD-10-CM

## 2020-06-03 DIAGNOSIS — Z3A19 19 weeks gestation of pregnancy: Secondary | ICD-10-CM

## 2020-06-03 DIAGNOSIS — Z363 Encounter for antenatal screening for malformations: Secondary | ICD-10-CM | POA: Diagnosis not present

## 2020-06-03 LAB — POCT URINALYSIS DIPSTICK OB
Blood, UA: NEGATIVE
Glucose, UA: NEGATIVE
Ketones, UA: NEGATIVE
Leukocytes, UA: NEGATIVE
Nitrite, UA: NEGATIVE
POC,PROTEIN,UA: NEGATIVE

## 2020-06-03 NOTE — Patient Instructions (Signed)
Erin Dennis, I greatly value your feedback.  If you receive a survey following your visit with [place] today, we appreciate you taking the time to fill it out.  Thanks, Derrill Memo, [place] at [facility] ([place], [place] [address]) Entrance C, located off of [address] parking  Go to [email or web address] to register for FREE online childbirth classes  [place] Pediatricians/Family Doctors:  [facility] [place] [phone]                 [place] [phone] (usually not accepting new patients unless you have family there already, you are always welcome to call and ask)       [facility] Department [phone]       [facility] Pediatricians/Family Doctors:   [facility]: [phone]  [facility]: [phone]  [facility]: [place] Doctors:   [facility]: [facility]: [place]:  [place]: [phone]    Home Blood Pressure Monitoring for Patients   Your provider has recommended that you check your blood pressure (BP) at least once a week at home. If you do not have a blood pressure cuff at home, one will be provided for you. Contact your provider if you have not received your monitor within 1 week.   Helpful Tips for Accurate Home Blood Pressure Checks  . Don't smoke, exercise, or drink caffeine 30 minutes before checking your BP . Use the restroom before checking your BP (a full bladder can raise your pressure) . Relax in a comfortable upright chair . Feet on the ground . Left arm resting comfortably on a flat surface at the level of your heart . Legs uncrossed . Back supported . Sit quietly and don't talk . Place the cuff on your bare arm . Adjust snuggly, so that only two  fingertips can fit between your skin and the top of the cuff . Check 2 readings separated by at least one minute . Keep a log of your BP readings . For a visual, please reference this diagram: http://ccnc.care/bpdiagram  Provider Name: [facility]     Phone: [phone]  Zone 1: ALL CLEAR  Continue to monitor your symptoms:  . BP reading is less than 140 (top number) or less than 90 (bottom number)  . No right upper stomach pain . No headaches or seeing spots . No feeling nauseated or throwing up . No swelling in face and hands  Zone 2: CAUTION Call your doctor's office for any of the following:  . BP reading is greater than 140 (top number) or greater than 90 (bottom number)  . Stomach pain under your ribs in the middle or right side . Headaches or seeing spots . Feeling nauseated or throwing up . Swelling in face and hands  Zone 3: EMERGENCY  Seek immediate medical care if you have any of the following:  . BP reading is greater than160 (top number) or greater than 110 (bottom number) . Severe headaches not improving with Tylenol . Serious difficulty catching your breath . Any worsening symptoms from Zone 2     Second Trimester of Pregnancy The second trimester is from week 14 through week 27 (months 4 through 6). The second trimester is often a time when you feel your best. Your  body has adjusted to being pregnant, and you begin to feel better physically. Usually, morning sickness has lessened or quit completely, you may have more energy, and you may have an increase in appetite. The second trimester is also a time when the fetus is growing rapidly. At the end of the sixth month, the fetus is about 9 inches long and weighs about 1 pounds. You will likely begin to feel the baby move (quickening) between 16 and 20 weeks of pregnancy. Body changes during your second trimester Your body continues to go through many changes during your second trimester. The changes vary from  woman to woman.  Your weight will continue to increase. You will notice your lower abdomen bulging out.  You may begin to get stretch marks on your hips, abdomen, and breasts.  You may develop headaches that can be relieved by medicines. The medicines should be approved by your health care provider.  You may urinate more often because the fetus is pressing on your bladder.  You may develop or continue to have heartburn as a result of your pregnancy.  You may develop constipation because certain hormones are causing the muscles that push waste through your intestines to slow down.  You may develop hemorrhoids or swollen, bulging veins (varicose veins).  You may have back pain. This is caused by: ? Weight gain. ? Pregnancy hormones that are relaxing the joints in your pelvis. ? A shift in weight and the muscles that support your balance.  Your breasts will continue to grow and they will continue to become tender.  Your gums may bleed and may be sensitive to brushing and flossing.  Dark spots or blotches (chloasma, mask of pregnancy) may develop on your face. This will likely fade after the baby is born.  A dark line from your belly button to the pubic area (linea nigra) may appear. This will likely fade after the baby is born.  You may have changes in your hair. These can include thickening of your hair, rapid growth, and changes in texture. Some women also have hair loss during or after pregnancy, or hair that feels dry or thin. Your hair will most likely return to normal after your baby is born.  What to expect at prenatal visits During a routine prenatal visit:  You will be weighed to make sure you and the fetus are growing normally.  Your blood pressure will be taken.  Your abdomen will be measured to track your baby's growth.  The fetal heartbeat will be listened to.  Any test results from the previous visit will be discussed.  Your health care provider may ask  you:  How you are feeling.  If you are feeling the baby move.  If you have had any abnormal symptoms, such as leaking fluid, bleeding, severe headaches, or abdominal cramping.  If you are using any tobacco products, including cigarettes, chewing tobacco, and electronic cigarettes.  If you have any questions.  Other tests that may be performed during your second trimester include:  Blood tests that check for: ? Low iron levels (anemia). ? High blood sugar that affects pregnant women (gestational diabetes) between 57 and 28 weeks. ? Rh antibodies. This is to check for a protein on red blood cells (Rh factor).  Urine tests to check for infections, diabetes, or protein in the urine.  An ultrasound to confirm the proper growth and development of the baby.  An amniocentesis to check for possible genetic problems.  Fetal screens for  spina bifida and Down syndrome.  HIV (human immunodeficiency virus) testing. Routine prenatal testing includes screening for HIV, unless you choose not to have this test.  Follow these instructions at home: Medicines  Follow your health care provider's instructions regarding medicine use. Specific medicines may be either safe or unsafe to take during pregnancy.  Take a prenatal vitamin that contains at least 600 micrograms (mcg) of folic acid.  If you develop constipation, try taking a stool softener if your health care provider approves. Eating and drinking  Eat a balanced diet that includes fresh fruits and vegetables, whole grains, good sources of protein such as meat, eggs, or tofu, and low-fat dairy. Your health care provider will help you determine the amount of weight gain that is right for you.  Avoid raw meat and uncooked cheese. These carry germs that can cause birth defects in the baby.  If you have low calcium intake from food, talk to your health care provider about whether you should take a daily calcium supplement.  Limit foods that  are high in fat and processed sugars, such as fried and sweet foods.  To prevent constipation: ? Drink enough fluid to keep your urine clear or pale yellow. ? Eat foods that are high in fiber, such as fresh fruits and vegetables, whole grains, and beans. Activity  Exercise only as directed by your health care provider. Most women can continue their usual exercise routine during pregnancy. Try to exercise for 30 minutes at least 5 days a week. Stop exercising if you experience uterine contractions.  Avoid heavy lifting, wear low heel shoes, and practice good posture.  A sexual relationship may be continued unless your health care provider directs you otherwise. Relieving pain and discomfort  Wear a good support bra to prevent discomfort from breast tenderness.  Take warm sitz baths to soothe any pain or discomfort caused by hemorrhoids. Use hemorrhoid cream if your health care provider approves.  Rest with your legs elevated if you have leg cramps or low back pain.  If you develop varicose veins, wear support hose. Elevate your feet for 15 minutes, 3-4 times a day. Limit salt in your diet. Prenatal Care  Write down your questions. Take them to your prenatal visits.  Keep all your prenatal visits as told by your health care provider. This is important. Safety  Wear your seat belt at all times when driving.  Make a list of emergency phone numbers, including numbers for family, friends, the hospital, and police and fire departments. General instructions  Ask your health care provider for a referral to a local prenatal education class. Begin classes no later than the beginning of month 6 of your pregnancy.  Ask for help if you have counseling or nutritional needs during pregnancy. Your health care provider can offer advice or refer you to specialists for help with various needs.  Do not use hot tubs, steam rooms, or saunas.  Do not douche or use tampons or scented sanitary  pads.  Do not cross your legs for long periods of time.  Avoid cat litter boxes and soil used by cats. These carry germs that can cause birth defects in the baby and possibly loss of the fetus by miscarriage or stillbirth.  Avoid all smoking, herbs, alcohol, and unprescribed drugs. Chemicals in these products can affect the formation and growth of the baby.  Do not use any products that contain nicotine or tobacco, such as cigarettes and e-cigarettes. If you need help quitting, ask  your health care provider.  Visit your dentist if you have not gone yet during your pregnancy. Use a soft toothbrush to brush your teeth and be gentle when you floss. Contact a health care provider if:  You have dizziness.  You have mild pelvic cramps, pelvic pressure, or nagging pain in the abdominal area.  You have persistent nausea, vomiting, or diarrhea.  You have a bad smelling vaginal discharge.  You have pain when you urinate. Get help right away if:  You have a fever.  You are leaking fluid from your vagina.  You have spotting or bleeding from your vagina.  You have severe abdominal cramping or pain.  You have rapid weight gain or weight loss.  You have shortness of breath with chest pain.  You notice sudden or extreme swelling of your face, hands, ankles, feet, or legs.  You have not felt your baby move in over an hour.  You have severe headaches that do not go away when you take medicine.  You have vision changes. Summary  The second trimester is from week 14 through week 27 (months 4 through 6). It is also a time when the fetus is growing rapidly.  Your body goes through many changes during pregnancy. The changes vary from woman to woman.  Avoid all smoking, herbs, alcohol, and unprescribed drugs. These chemicals affect the formation and growth your baby.  Do not use any tobacco products, such as cigarettes, chewing tobacco, and e-cigarettes. If you need help quitting, ask your  health care provider.  Contact your health care provider if you have any questions. Keep all prenatal visits as told by your health care provider. This is important. This information is not intended to replace advice given to you by your health care provider. Make sure you discuss any questions you have with your health care provider. Document Released: 07/12/2001 Document Revised: 12/24/2015 Document Reviewed: 09/18/2012 Elsevier Interactive Patient Education  2017 [employer].

## 2020-06-03 NOTE — Progress Notes (Signed)
[place] 19 wks,breech,anterior placenta gr 0,normal ovaries,fhr 146 bpm,cx 3.3 cm,svp of fluid 3.5 cm,EFW 291 g 69%,anatomy complete,no obvious abnormalities

## 2020-06-03 NOTE — Progress Notes (Signed)
   LOW-RISK PREGNANCY VISIT Patient name: Erin Dennis MRN [id number]  Date of birth: Dec 07, 1980 Chief Complaint:   Routine Prenatal Visit  History of Present Illness:   Erin Dennis is a 39 y.o. G70P0020 female at [redacted]w[redacted]d with an Estimated Date of Delivery: 10/28/20 being seen today for ongoing management of a low-risk pregnancy.  Today she reports feeling pretty well, but concerned re total weight gain of 9lbs- reassured.. Contractions: Not present. Vag. Bleeding: None.  Movement: Absent. denies leaking of fluid. Review of Systems:   Pertinent items are noted in HPI Denies abnormal vaginal discharge w/ itching/odor/irritation, headaches, visual changes, shortness of breath, chest pain, abdominal pain, severe nausea/vomiting, or problems with urination or bowel movements unless otherwise stated above. Pertinent History Reviewed:  Reviewed past medical,surgical, social, obstetrical and family history.  Reviewed problem list, medications and allergies. Physical Assessment:   Vitals:   06/03/20 1526  BP: 95/63  Pulse: 66  Weight: 144 lb 6.4 oz (65.5 kg)  Body mass index is 27.28 kg/m.        Physical Examination:   General appearance: Well appearing, and in no distress  Mental status: Alert, oriented to person, place, and time  Skin: Warm & dry  Cardiovascular: Normal heart rate noted  Respiratory: Normal respiratory effort, no distress  Abdomen: Soft, gravid, nontender  Pelvic: Cervical exam deferred         Extremities: Edema: None  Fetal Status: Fetal Heart Rate (bpm): 146 u/s   Movement: Absent     Anatomy u/s: [place] 19 wks,breech,anterior placenta gr 0,normal ovaries,fhr 146 bpm,cx 3.3 cm,svp of fluid 3.5 cm,EFW 291 g 69%,anatomy complete,no obvious abnormalities   Results for orders placed or performed in visit on 06/03/20 (from the past 24 hour(s))  POC Urinalysis Dipstick OB   Collection Time: 06/03/20  3:24 PM  Result Value Ref Range   Color, UA     Clarity, UA      Glucose, UA Negative Negative   Bilirubin, UA     Ketones, UA neg    Spec Grav, UA     Blood, UA neg    pH, UA     POC,PROTEIN,UA Negative Negative, Trace, Small (1+), Moderate (2+), Large (3+), 4+   Urobilinogen, UA     Nitrite, UA neg    Leukocytes, UA Negative Negative   Appearance     Odor      Assessment & Plan:  1) Low-risk pregnancy G3P0020 at [redacted]w[redacted]d with an Estimated Date of Delivery: 10/28/20   2) Desires ppBTL, will have her sign papers at 28wks   Meds: No orders of the defined types were placed in this encounter.  Labs/procedures today: anatomy u/s  Plan:  Continue routine obstetrical care   Reviewed: Term labor symptoms and general obstetric precautions including but not limited to vaginal bleeding, contractions, leaking of fluid and fetal movement were reviewed in detail with the patient.  All questions were answered. Didn't ask about home bp cuff. Check bp weekly, let us know if >140/90.   Follow-up: Return in about 4 weeks (around 07/01/2020) for [place], in person.  Orders Placed This Encounter  Procedures  . POC Urinalysis Dipstick OB   Myrtis Ser [facility] 06/03/2020 3:33 PM

## 2020-06-30 ENCOUNTER — Other Ambulatory Visit: Payer: Self-pay

## 2020-06-30 ENCOUNTER — Encounter (HOSPITAL_COMMUNITY): Payer: Self-pay | Admitting: Obstetrics & Gynecology

## 2020-06-30 ENCOUNTER — Inpatient Hospital Stay (HOSPITAL_COMMUNITY)
Admission: AD | Admit: 2020-06-30 | Discharge: 2020-06-30 | Disposition: A | Discharge status: Home / Self Care | Payer: Medicaid Other | Attending: Obstetrics & Gynecology | Admitting: Obstetrics & Gynecology

## 2020-06-30 ENCOUNTER — Inpatient Hospital Stay (HOSPITAL_BASED_OUTPATIENT_CLINIC_OR_DEPARTMENT_OTHER): Payer: Medicaid Other

## 2020-06-30 DIAGNOSIS — O26892 Other specified pregnancy related conditions, second trimester: Secondary | ICD-10-CM | POA: Diagnosis not present

## 2020-06-30 DIAGNOSIS — O368120 Decreased fetal movements, second trimester, not applicable or unspecified: Secondary | ICD-10-CM | POA: Diagnosis not present

## 2020-06-30 DIAGNOSIS — R109 Unspecified abdominal pain: Secondary | ICD-10-CM

## 2020-06-30 DIAGNOSIS — O4692 Antepartum hemorrhage, unspecified, second trimester: Secondary | ICD-10-CM | POA: Diagnosis not present

## 2020-06-30 DIAGNOSIS — O321XX0 Maternal care for breech presentation, not applicable or unspecified: Secondary | ICD-10-CM

## 2020-06-30 DIAGNOSIS — Z3A22 22 weeks gestation of pregnancy: Secondary | ICD-10-CM

## 2020-06-30 DIAGNOSIS — O26852 Spotting complicating pregnancy, second trimester: Secondary | ICD-10-CM | POA: Diagnosis present

## 2020-06-30 DIAGNOSIS — O368130 Decreased fetal movements, third trimester, not applicable or unspecified: Secondary | ICD-10-CM

## 2020-06-30 LAB — URINALYSIS, ROUTINE W REFLEX MICROSCOPIC
Bilirubin Urine: NEGATIVE
Glucose, UA: NEGATIVE mg/dL
Hgb urine dipstick: NEGATIVE
Ketones, ur: 5 mg/dL — AB
Leukocytes,Ua: NEGATIVE
Nitrite: NEGATIVE
Protein, ur: NEGATIVE mg/dL
Specific Gravity, Urine: 1.019 (ref 1.005–1.030)
pH: 5 (ref 5.0–8.0)

## 2020-06-30 LAB — CBC
HCT: 34.9 % — ABNORMAL LOW (ref 36.0–46.0)
Hemoglobin: 11.6 g/dL — ABNORMAL LOW (ref 12.0–15.0)
MCH: 31.4 pg (ref 26.0–34.0)
MCHC: 33.2 g/dL (ref 30.0–36.0)
MCV: 94.6 fL (ref 80.0–100.0)
Platelets: 210 10*3/uL (ref 150–400)
RBC: 3.69 MIL/uL — ABNORMAL LOW (ref 3.87–5.11)
RDW: 13.5 % (ref 11.5–15.5)
WBC: 12.5 10*3/uL — ABNORMAL HIGH (ref 4.0–10.5)
nRBC: 0 % (ref 0.0–0.2)

## 2020-06-30 LAB — WET PREP, GENITAL
Clue Cells Wet Prep HPF POC: NONE SEEN
Sperm: NONE SEEN
Trich, Wet Prep: NONE SEEN
Yeast Wet Prep HPF POC: NONE SEEN

## 2020-06-30 NOTE — MAU Note (Signed)
Hasn't felt the baby move all day, couldn't find the heartbeat. (pt has a doppler at home).  Noted a reddish d/c when she got out of the shower.  Little cramping in her lower stomach, started about 2 hrs ago.

## 2020-06-30 NOTE — MAU Provider Note (Signed)
History     CSN: [id number]  Arrival date and time: 06/30/20 1813   First Provider Initiated Contact with Patient 06/30/20 1909      Chief Complaint  Patient presents with  . Vaginal Bleeding  . Abdominal Pain  . Decreased Fetal Movement   HPI Erin Dennis is a 39 y.o. G3P0020 at [redacted]w[redacted]d who presents to MAU with chief complaint of vaginal spotting. Patient reports pink-tinged discharge when she got out of the shower earlier today. She also could not find her baby's heartbeat on her home doppler. She is also concerned that she hasn't felt the baby moved at all today. Pertinent negatives: overt bleeding, abdominal pain, dysuria, abdominal tenderness, leaking of fluid. She is remote from sexual intercourse.  Patient receives care with [place].  OB History    Gravida  3   Para      Term      Preterm      AB  2   Living        SAB  1   TAB      Ectopic  1   Multiple      Live Births              Past Medical History:  Diagnosis Date  . Darier disease   . Depression     Past Surgical History:  Procedure Laterality Date  . APPENDECTOMY    . CHOLECYSTECTOMY    . DILATION AND CURETTAGE OF UTERUS     for menorrhagia  . DILATION AND CURETTAGE OF UTERUS N/A 10/07/2019   Procedure: SUCTION DILATATION AND CURETTAGE;  Surgeon: [name], MD;  Location: [facility];  Service: Gynecology;  Laterality: N/A;  Dr. Request Time 9:30am  . OVARIAN CYST SURGERY     x3  . TONSILLECTOMY    . WISDOM TOOTH EXTRACTION      Family History  Problem Relation Age of Onset  . Diabetes Mother   . Hypertension Mother   . Diabetes Father   . Stroke Father   . Cancer Maternal Grandmother        ovarian    Social History   Tobacco Use  . Smoking status: Never Smoker  . Smokeless tobacco: Never Used  Vaping Use  . Vaping Use: Never used  Substance Use Topics  . Alcohol use: Not Currently  . Drug use: Not Currently    Allergies:  Allergies  Allergen  Reactions  . Acetaminophen Anaphylaxis  . Sulfamethoxazole-Trimethoprim Other (See Comments)    Blisters  . Tape Rash    Medications Prior to Admission  Medication Sig Dispense Refill Last Dose  . Prenatal Vit-Fe Fumarate-FA (MULTIVITAMIN-PRENATAL) 27-0.8 MG TABS tablet Take 1 tablet by mouth daily at 12 noon.   06/29/2020 at Unknown time  . Blood Pressure Monitor MISC For regular home bp monitoring during pregnancy (Patient not taking: Reported on 04/15/2020) 1 each 0     Review of Systems  Genitourinary: Positive for vaginal bleeding.  All other systems reviewed and are negative.  Physical Exam   Blood pressure 110/73, pulse 72, temperature 98.1 F (36.7 C), temperature source Oral, resp. rate 17, height 5\' 1"  (1.549 m), weight 67.8 kg, last menstrual period 01/22/2020, SpO2 99 %.  Physical Exam Vitals and nursing note reviewed. Exam conducted with a chaperone present.  Constitutional:      General: She is in acute distress.  Abdominal:     Palpations: Abdomen is soft.     Tenderness: There  is no abdominal tenderness. There is no right CVA tenderness or left CVA tenderness.  Genitourinary:    Comments: Pelvic exam: External genitalia normal, vaginal walls pink and well rugated, cervix visually closed, no lesions noted. No blood-tinged mucus or active bleeding noted  Skin:    General: Skin is warm.  Neurological:     Mental Status: She is alert.     MAU Course  Procedures : speculum exam, [place] MFM OB Limited  Orders Placed This Encounter  Procedures  . Wet prep, genital  . [place] MFM OB Limited  . Urinalysis, Routine w reflex microscopic Urine, Clean Catch  . CBC   Results for orders placed or performed during the hospital encounter of 06/30/20 (from the past 24 hour(s))  Urinalysis, Routine w reflex microscopic Urine, Clean Catch     Status: Abnormal   Collection Time: 06/30/20  6:40 PM  Result Value Ref Range   Color, Urine YELLOW YELLOW   APPearance HAZY (A) CLEAR    Specific Gravity, Urine 1.019 1.005 - 1.030   pH 5.0 5.0 - 8.0   Glucose, UA NEGATIVE NEGATIVE mg/dL   Hgb urine dipstick NEGATIVE NEGATIVE   Bilirubin Urine NEGATIVE NEGATIVE   Ketones, ur 5 (A) NEGATIVE mg/dL   Protein, ur NEGATIVE NEGATIVE mg/dL   Nitrite NEGATIVE NEGATIVE   Leukocytes,Ua NEGATIVE NEGATIVE  CBC     Status: Abnormal   Collection Time: 06/30/20  7:24 PM  Result Value Ref Range   WBC 12.5 (H) 4.0 - 10.5 K/uL   RBC 3.69 (L) 3.87 - 5.11 MIL/uL   Hemoglobin 11.6 (L) 12.0 - 15.0 g/dL   HCT 34.9 (L) 36 - 46 %   MCV 94.6 80.0 - 100.0 fL   MCH 31.4 26.0 - 34.0 pg   MCHC 33.2 30.0 - 36.0 g/dL   RDW 13.5 11.5 - 15.5 %   Platelets 210 150 - 400 K/uL   nRBC 0.0 0.0 - 0.2 %  Wet prep, genital     Status: Abnormal   Collection Time: 06/30/20  7:50 PM   Specimen: Vaginal  Result Value Ref Range   Yeast Wet Prep HPF POC NONE SEEN NONE SEEN   Trich, Wet Prep NONE SEEN NONE SEEN   Clue Cells Wet Prep HPF POC NONE SEEN NONE SEEN   WBC, Wet Prep HPF POC MODERATE (A) NONE SEEN   Sperm NONE SEEN     Assessment and Plan  --39 y.o. G3P0020 at [redacted]w[redacted]d  --Single episode of red-tinged discharge after shower --No bleeding on spec exam in MAU --No concerning findings on [place] MFM OB Limited --Blood type O POS --Discharge home in stable condition  F/U: --Next appt [facility] is tomorrow 07/01/2020  Darlina Rumpf, CNM 06/30/2020, 9:07 PM

## 2020-06-30 NOTE — Discharge Instructions (Signed)
How a Baby Grows During Pregnancy  Pregnancy begins when a female's sperm enters a female's egg (fertilization). Fertilization usually happens in one of the tubes (fallopian tubes) that connect the ovaries to the womb (uterus). The fertilized egg moves down the fallopian tube to the uterus. Once it reaches the uterus, it implants into the lining of the uterus and begins to grow. For the first 10 weeks, the fertilized egg is called an embryo. After 10 weeks, it is called a fetus. As the fetus continues to grow, it receives oxygen and nutrients through tissue (placenta) that grows to support the developing baby. The placenta is the life support system for the baby. It provides oxygen and nutrition and removes waste. Learning as much as you can about your pregnancy and how your baby is developing can help you enjoy the experience. It can also make you aware of when there might be a problem and when to ask questions. How long does a typical pregnancy last? A pregnancy usually lasts 280 days, or about 40 weeks. Pregnancy is divided into three periods of growth, also called trimesters:  First trimester: 0-12 weeks.  Second trimester: 13-27 weeks.  Third trimester: 28-40 weeks. The day when your baby is ready to be born (full term) is your estimated date of delivery. How does my baby develop month by month? First month  The fertilized egg attaches to the inside of the uterus.  Some cells will form the placenta. Others will form the fetus.  The arms, legs, brain, spinal cord, lungs, and heart begin to develop.  At the end of the first month, the heart begins to beat. Second month  The bones, inner ear, eyelids, hands, and feet form.  The genitals develop.  By the end of 8 weeks, all major organs are developing. Third month  All of the internal organs are forming.  Teeth develop below the gums.  Bones and muscles begin to grow. The spine can flex.  The skin is transparent.  Fingernails  and toenails begin to form.  Arms and legs continue to grow longer, and hands and feet develop.  The fetus is about 3 inches (7.6 cm) long. Fourth month  The placenta is completely formed.  The external sex organs, neck, outer ear, eyebrows, eyelids, and fingernails are formed.  The fetus can hear, swallow, and move its arms and legs.  The kidneys begin to produce urine.  The skin is covered with a white, waxy coating (vernix) and very fine hair (lanugo). Fifth month  The fetus moves around more and can be felt for the first time (quickening).  The fetus starts to sleep and wake up and may begin to suck its finger.  The nails grow to the end of the fingers.  The organ in the digestive system that makes bile (gallbladder) functions and helps to digest nutrients.  If your baby is a girl, eggs are present in her ovaries. If your baby is a boy, testicles start to move down into his scrotum. Sixth month  The lungs are formed.  The eyes open. The brain continues to develop.  Your baby has fingerprints and toe prints. Your baby's hair grows thicker.  At the end of the second trimester, the fetus is about 9 inches (22.9 cm) long. Seventh month  The fetus kicks and stretches.  The eyes are developed enough to sense changes in light.  The hands can make a grasping motion.  The fetus responds to sound. Eighth month  All  organs and body systems are fully developed and functioning.  Bones harden, and taste buds develop. The fetus may hiccup.  Certain areas of the brain are still developing. The skull remains soft. Ninth month  The fetus gains about  lb (0.23 kg) each week.  The lungs are fully developed.  Patterns of sleep develop.  The fetus's head typically moves into a head-down position (vertex) in the uterus to prepare for birth.  The fetus weighs 6-9 lb (2.72-4.08 kg) and is 19-20 inches (48.26-50.8 cm) long. What can I do to have a healthy pregnancy and help  my baby develop? General instructions  Take prenatal vitamins as directed by your health care provider. These include vitamins such as folic acid, iron, calcium, and vitamin D. They are important for healthy development.  Take medicines only as directed by your health care provider. Read labels and ask a pharmacist or your health care provider whether over-the-counter medicines, supplements, and prescription drugs are safe to take during pregnancy.  Keep all follow-up visits as directed by your health care provider. This is important. Follow-up visits include prenatal care and screening tests. How do I know if my baby is developing well? At each prenatal visit, your health care provider will do several different tests to check on your health and keep track of your baby's development. These include:  Fundal height and position. ? Your health care provider will measure your growing belly from your pubic bone to the top of the uterus using a tape measure. ? Your health care provider will also feel your belly to determine your baby's position.  Heartbeat. ? An ultrasound in the first trimester can confirm pregnancy and show a heartbeat, depending on how far along you are. ? Your health care provider will check your baby's heart rate at every prenatal visit.  Second trimester ultrasound. ? This ultrasound checks your baby's development. It also may show your baby's gender. What should I do if I have concerns about my baby's development? Always talk with your health care provider about any concerns that you may have about your pregnancy and your baby. Summary  A pregnancy usually lasts 280 days, or about 40 weeks. Pregnancy is divided into three periods of growth, also called trimesters.  Your health care provider will monitor your baby's growth and development throughout your pregnancy.  Follow your health care provider's recommendations about taking prenatal vitamins and medicines during  your pregnancy.  Talk with your health care provider if you have any concerns about your pregnancy or your developing baby. This information is not intended to replace advice given to you by your health care provider. Make sure you discuss any questions you have with your health care provider. Document Revised: 11/08/2018 Document Reviewed: 05/31/2017 Elsevier Patient Education  2020 [employer].

## 2020-07-01 ENCOUNTER — Ambulatory Visit (INDEPENDENT_AMBULATORY_CARE_PROVIDER_SITE_OTHER): Payer: Medicaid Other | Admitting: Advanced Practice Midwife

## 2020-07-01 VITALS — BP 116/62 | HR 80 | Wt 148.6 lb

## 2020-07-01 DIAGNOSIS — Z1389 Encounter for screening for other disorder: Secondary | ICD-10-CM

## 2020-07-01 DIAGNOSIS — Z3A23 23 weeks gestation of pregnancy: Secondary | ICD-10-CM

## 2020-07-01 DIAGNOSIS — Z23 Encounter for immunization: Secondary | ICD-10-CM

## 2020-07-01 DIAGNOSIS — Z3480 Encounter for supervision of other normal pregnancy, unspecified trimester: Secondary | ICD-10-CM

## 2020-07-01 DIAGNOSIS — Z331 Pregnant state, incidental: Secondary | ICD-10-CM

## 2020-07-01 LAB — POCT URINALYSIS DIPSTICK OB
Blood, UA: NEGATIVE
Glucose, UA: NEGATIVE
Ketones, UA: NEGATIVE
Leukocytes, UA: NEGATIVE
Nitrite, UA: NEGATIVE
POC,PROTEIN,UA: NEGATIVE

## 2020-07-01 NOTE — Patient Instructions (Signed)
Erin Dennis, I greatly value your feedback.  If you receive a survey following your visit with [place] today, we appreciate you taking the time to fill it out.  Thanks, Derrill Memo, CNM   You will have your sugar test next visit.  Please do not eat or drink anything after midnight the night before you come, not even water.  You will be here for at least two hours.  Please make an appointment online for the bloodwork at [email or web address] for 8:30am (or as close to this as possible). Make sure you select the [facility] service center. The day of the appointment, check in with our office first, then you will go to [place] to start the sugar test.    [place]!!! It is now [place] at [facility] ([place], [place] [address]) Entrance C, located off of [address] parking  Go to [email or web address] to register for FREE online childbirth classes   Call the office [phone]) or go to [facility] if:  You begin to have strong, frequent contractions  Your water breaks.  Sometimes it is a big gush of fluid, sometimes it is just a trickle that keeps getting your panties wet or running down your legs  You have vaginal bleeding.  It is normal to have a small amount of spotting if your cervix was checked.   You don't feel your baby moving like normal.  If you don't, get you something to eat and drink and lay down and focus on feeling your baby move.   If your baby is still not moving like normal, you should call the office or go to [place] Pediatricians/Family Doctors:  [place] [phone]                 [place] [phone] (usually not accepting new patients unless you have family there already, you are always welcome to call and ask)       [facility] Department [phone]       [facility] Pediatricians/Family Doctors:    [facility]: [phone]  [facility]: [phone]  [facility]: [place] Doctors:   [facility]: [facility]: [place]:  [place]: [phone]   Home Blood Pressure Monitoring for Patients   Your provider has recommended that you check your blood pressure (BP) at least once a week at home. If you do not have a blood pressure cuff at home, one will be provided for you. Contact your provider if you have not received your monitor within 1 week.   Helpful Tips for Accurate Home Blood Pressure Checks  . Don't smoke, exercise, or drink caffeine 30 minutes before checking your BP . Use the restroom before checking your BP (a full bladder can raise your pressure) . Relax in a comfortable upright chair . Feet on the ground . Left arm resting comfortably on a flat surface at the level of your heart . Legs uncrossed . Back supported . Sit quietly and don't talk . Place the cuff on your bare arm . Adjust snuggly, so that only two fingertips can fit between your skin and the top of the cuff . Check 2 readings separated by at least one minute . Keep a log of your BP readings .  For a visual, please reference this diagram: http://ccnc.care/bpdiagram  Provider Name: [facility]     Phone: [phone]  Zone 1: ALL CLEAR  Continue to monitor your symptoms:  . BP reading is less than 140 (top number) or less than 90 (bottom number)  . No right upper stomach pain . No headaches or seeing spots . No feeling nauseated or throwing up . No swelling in face and hands  Zone 2: CAUTION Call your doctor's office for any of the following:  . BP reading is greater than 140 (top number) or greater than 90 (bottom number)  . Stomach pain under your ribs in the middle or right side . Headaches or seeing spots . Feeling  nauseated or throwing up . Swelling in face and hands  Zone 3: EMERGENCY  Seek immediate medical care if you have any of the following:  . BP reading is greater than160 (top number) or greater than 110 (bottom number) . Severe headaches not improving with Tylenol . Serious difficulty catching your breath . Any worsening symptoms from Zone 2   Second Trimester of Pregnancy The second trimester is from week 13 through week 28, months 4 through 6. The second trimester is often a time when you feel your best. Your body has also adjusted to being pregnant, and you begin to feel better physically. Usually, morning sickness has lessened or quit completely, you may have more energy, and you may have an increase in appetite. The second trimester is also a time when the fetus is growing rapidly. At the end of the sixth month, the fetus is about 9 inches long and weighs about 1 pounds. You will likely begin to feel the baby move (quickening) between 18 and 20 weeks of the pregnancy. BODY CHANGES Your body goes through many changes during pregnancy. The changes vary from woman to woman.   Your weight will continue to increase. You will notice your lower abdomen bulging out.  You may begin to get stretch marks on your hips, abdomen, and breasts.  You may develop headaches that can be relieved by medicines approved by your health care provider.  You may urinate more often because the fetus is pressing on your bladder.  You may develop or continue to have heartburn as a result of your pregnancy.  You may develop constipation because certain hormones are causing the muscles that push waste through your intestines to slow down.  You may develop hemorrhoids or swollen, bulging veins (varicose veins).  You may have back pain because of the weight gain and pregnancy hormones relaxing your joints between the bones in your pelvis and as a result of a shift in weight and the muscles that support your  balance.  Your breasts will continue to grow and be tender.  Your gums may bleed and may be sensitive to brushing and flossing.  Dark spots or blotches (chloasma, mask of pregnancy) may develop on your face. This will likely fade after the baby is born.  A dark line from your belly button to the pubic area (linea nigra) may appear. This will likely fade after the baby is born.  You may have changes in your hair. These can include thickening of your hair, rapid growth, and changes in texture. Some women also have hair loss during or after pregnancy, or hair that feels dry or thin. Your hair will most likely return to normal after your baby is born. WHAT TO EXPECT AT YOUR PRENATAL VISITS During a routine  prenatal visit:  You will be weighed to make sure you and the fetus are growing normally.  Your blood pressure will be taken.  Your abdomen will be measured to track your baby's growth.  The fetal heartbeat will be listened to.  Any test results from the previous visit will be discussed. Your health care provider may ask you:  How you are feeling.  If you are feeling the baby move.  If you have had any abnormal symptoms, such as leaking fluid, bleeding, severe headaches, or abdominal cramping.  If you have any questions. Other tests that may be performed during your second trimester include:  Blood tests that check for:  Low iron levels (anemia).  Gestational diabetes (between 24 and 28 weeks).  Rh antibodies.  Urine tests to check for infections, diabetes, or protein in the urine.  An ultrasound to confirm the proper growth and development of the baby.  An amniocentesis to check for possible genetic problems.  Fetal screens for spina bifida and Down syndrome. HOME CARE INSTRUCTIONS   Avoid all smoking, herbs, alcohol, and unprescribed drugs. These chemicals affect the formation and growth of the baby.  Follow your health care provider's instructions regarding  medicine use. There are medicines that are either safe or unsafe to take during pregnancy.  Exercise only as directed by your health care provider. Experiencing uterine cramps is a good sign to stop exercising.  Continue to eat regular, healthy meals.  Wear a good support bra for breast tenderness.  Do not use hot tubs, steam rooms, or saunas.  Wear your seat belt at all times when driving.  Avoid raw meat, uncooked cheese, cat litter boxes, and soil used by cats. These carry germs that can cause birth defects in the baby.  Take your prenatal vitamins.  Try taking a stool softener (if your health care provider approves) if you develop constipation. Eat more high-fiber foods, such as fresh vegetables or fruit and whole grains. Drink plenty of fluids to keep your urine clear or pale yellow.  Take warm sitz baths to soothe any pain or discomfort caused by hemorrhoids. Use hemorrhoid cream if your health care provider approves.  If you develop varicose veins, wear support hose. Elevate your feet for 15 minutes, 3-4 times a day. Limit salt in your diet.  Avoid heavy lifting, wear low heel shoes, and practice good posture.  Rest with your legs elevated if you have leg cramps or low back pain.  Visit your dentist if you have not gone yet during your pregnancy. Use a soft toothbrush to brush your teeth and be gentle when you floss.  A sexual relationship may be continued unless your health care provider directs you otherwise.  Continue to go to all your prenatal visits as directed by your health care provider. SEEK MEDICAL CARE IF:   You have dizziness.  You have mild pelvic cramps, pelvic pressure, or nagging pain in the abdominal area.  You have persistent nausea, vomiting, or diarrhea.  You have a bad smelling vaginal discharge.  You have pain with urination. SEEK IMMEDIATE MEDICAL CARE IF:   You have a fever.  You are leaking fluid from your vagina.  You have spotting or  bleeding from your vagina.  You have severe abdominal cramping or pain.  You have rapid weight gain or loss.  You have shortness of breath with chest pain.  You notice sudden or extreme swelling of your face, hands, ankles, feet, or legs.  You have not  felt your baby move in over an hour.  You have severe headaches that do not go away with medicine.  You have vision changes. Document Released: 07/12/2001 Document Revised: 07/23/2013 Document Reviewed: 09/18/2012 [facility] Patient Information 2015 [place], [place]. This information is not intended to replace advice given to you by your health care provider. Make sure you discuss any questions you have with your health care provider.

## 2020-07-01 NOTE — Progress Notes (Addendum)
LOW-RISK PREGNANCY VISIT Patient name: Erin Dennis MRN [id number]  Date of birth: March 08, 1981 Chief Complaint:   Routine Prenatal Visit  History of Present Illness:   Neville Pauls is a 39 y.o. G71P0020 female at [redacted]w[redacted]d with an Estimated Date of Delivery: 10/28/20 being seen today for ongoing management of a low-risk pregnancy.  Today she reports being seen yesterday in MAU for not feeling the baby move and having some pink d/c- felt reassured, but feels anxious re the health of the baby in general and will be glad when he's here!. Contractions: Not present. Vag. Bleeding: None.  Movement: Present. denies leaking of fluid. Review of Systems:   Pertinent items are noted in HPI Denies abnormal vaginal discharge w/ itching/odor/irritation, headaches, visual changes, shortness of breath, chest pain, abdominal pain, severe nausea/vomiting, or problems with urination or bowel movements unless otherwise stated above. Pertinent History Reviewed:  Reviewed past medical,surgical, social, obstetrical and family history.  Reviewed problem list, medications and allergies. Physical Assessment:   Vitals:   07/01/20 1550  BP: 116/62  Pulse: 80  Weight: 148 lb 9.6 oz (67.4 kg)  Body mass index is 28.08 kg/m.        Physical Examination:   General appearance: Well appearing, and in no distress  Mental status: Alert, oriented to person, place, and time  Skin: Warm & dry  Cardiovascular: Normal heart rate noted  Respiratory: Normal respiratory effort, no distress  Abdomen: Soft, gravid, nontender  Pelvic: Cervical exam deferred         Extremities: Edema: None  Fetal Status: Fetal Heart Rate (bpm): 140 Fundal Height: 23 cm Movement: Present    Results for orders placed or performed in visit on 07/01/20 (from the past 24 hour(s))  POC Urinalysis Dipstick OB   Collection Time: 07/01/20  3:48 PM  Result Value Ref Range   Color, UA     Clarity, UA     Glucose, UA Negative Negative   Bilirubin,  UA     Ketones, UA neg    Spec Grav, UA     Blood, UA neg    pH, UA     POC,PROTEIN,UA Negative Negative, Trace, Small (1+), Moderate (2+), Large (3+), 4+   Urobilinogen, UA     Nitrite, UA neg    Leukocytes, UA Negative Negative   Appearance     Odor    Results for orders placed or performed during the hospital encounter of 06/30/20 (from the past 24 hour(s))  Urinalysis, Routine w reflex microscopic Urine, Clean Catch   Collection Time: 06/30/20  6:40 PM  Result Value Ref Range   Color, Urine YELLOW YELLOW   APPearance HAZY (A) CLEAR   Specific Gravity, Urine 1.019 1.005 - 1.030   pH 5.0 5.0 - 8.0   Glucose, UA NEGATIVE NEGATIVE mg/dL   Hgb urine dipstick NEGATIVE NEGATIVE   Bilirubin Urine NEGATIVE NEGATIVE   Ketones, ur 5 (A) NEGATIVE mg/dL   Protein, ur NEGATIVE NEGATIVE mg/dL   Nitrite NEGATIVE NEGATIVE   Leukocytes,Ua NEGATIVE NEGATIVE  CBC   Collection Time: 06/30/20  7:24 PM  Result Value Ref Range   WBC 12.5 (H) 4.0 - 10.5 K/uL   RBC 3.69 (L) 3.87 - 5.11 MIL/uL   Hemoglobin 11.6 (L) 12.0 - 15.0 g/dL   HCT 34.9 (L) 36 - 46 %   MCV 94.6 80.0 - 100.0 fL   MCH 31.4 26.0 - 34.0 pg   MCHC 33.2 30.0 - 36.0 g/dL   RDW 13.5 11.5 -  15.5 %   Platelets 210 150 - 400 K/uL   nRBC 0.0 0.0 - 0.2 %  Wet prep, genital   Collection Time: 06/30/20  7:50 PM   Specimen: Vaginal  Result Value Ref Range   Yeast Wet Prep HPF POC NONE SEEN NONE SEEN   Trich, Wet Prep NONE SEEN NONE SEEN   Clue Cells Wet Prep HPF POC NONE SEEN NONE SEEN   WBC, Wet Prep HPF POC MODERATE (A) NONE SEEN   Sperm NONE SEEN     Assessment & Plan:  1) Low-risk pregnancy G3P0020 at [redacted]w[redacted]d with an Estimated Date of Delivery: 10/28/20   2) AMA, <40 at delivery  3) Desires ppBTL, will sign papers at 28wks  4) Severe uterine atony following D&C, pt reports this occurred in March after D&C for MAB; required blood tx for a 6wk loss; I reassured her that we will keep this in mind after delivery   Meds: No  orders of the defined types were placed in this encounter.  Labs/procedures today: flu vax  Plan:  Continue routine obstetrical care   Reviewed: Preterm labor symptoms and general obstetric precautions including but not limited to vaginal bleeding, contractions, leaking of fluid and fetal movement were reviewed in detail with the patient.  All questions were answered. Didn't ask about home bp cuff.  Check bp weekly, let us know if >140/90.   Follow-up: Return in about 4 weeks (around 07/29/2020) for LROB, PN2, in person.  Orders Placed This Encounter  Procedures  . Flu Vaccine QUAD 36+ mos IM  . POC Urinalysis Dipstick OB   [facility] 07/01/2020 4:56 PM

## 2020-07-04 ENCOUNTER — Other Ambulatory Visit: Payer: Self-pay

## 2020-07-04 ENCOUNTER — Encounter (HOSPITAL_COMMUNITY): Payer: Self-pay | Admitting: Obstetrics and Gynecology

## 2020-07-04 ENCOUNTER — Inpatient Hospital Stay (HOSPITAL_COMMUNITY)
Admission: AD | Admit: 2020-07-04 | Discharge: 2020-07-04 | Disposition: A | Discharge status: Home / Self Care | Payer: Medicaid Other | Attending: Obstetrics and Gynecology | Admitting: Obstetrics and Gynecology

## 2020-07-04 DIAGNOSIS — R0789 Other chest pain: Secondary | ICD-10-CM | POA: Diagnosis not present

## 2020-07-04 DIAGNOSIS — Z3A23 23 weeks gestation of pregnancy: Secondary | ICD-10-CM | POA: Diagnosis not present

## 2020-07-04 DIAGNOSIS — Z302 Encounter for sterilization: Secondary | ICD-10-CM

## 2020-07-04 DIAGNOSIS — O368120 Decreased fetal movements, second trimester, not applicable or unspecified: Secondary | ICD-10-CM | POA: Diagnosis not present

## 2020-07-04 DIAGNOSIS — O26892 Other specified pregnancy related conditions, second trimester: Secondary | ICD-10-CM | POA: Insufficient documentation

## 2020-07-04 DIAGNOSIS — Z888 Allergy status to other drugs, medicaments and biological substances status: Secondary | ICD-10-CM | POA: Diagnosis not present

## 2020-07-04 DIAGNOSIS — O09512 Supervision of elderly primigravida, second trimester: Secondary | ICD-10-CM

## 2020-07-04 DIAGNOSIS — Z881 Allergy status to other antibiotic agents status: Secondary | ICD-10-CM | POA: Insufficient documentation

## 2020-07-04 DIAGNOSIS — Z0184 Encounter for antibody response examination: Secondary | ICD-10-CM

## 2020-07-04 DIAGNOSIS — Z9049 Acquired absence of other specified parts of digestive tract: Secondary | ICD-10-CM | POA: Diagnosis not present

## 2020-07-04 DIAGNOSIS — Z20822 Contact with and (suspected) exposure to covid-19: Secondary | ICD-10-CM

## 2020-07-04 HISTORY — DX: Anxiety disorder, unspecified: F41.9

## 2020-07-04 LAB — RESP PANEL BY RT-PCR (FLU A&B, COVID) ARPGX2
Influenza A by PCR: NEGATIVE
Influenza B by PCR: NEGATIVE
SARS Coronavirus 2 by RT PCR: NEGATIVE

## 2020-07-04 NOTE — MAU Note (Signed)
[name] is a 39 y.o. at [redacted]w[redacted]d here in MAU reporting: states she has been around her step daughter the past 3 days and she didn't tell the patient she wasn't feeling well. Step daughter did an at home covid test and it was positive. States her chest feels tight but pt report it has only started since she has been upset. Unsure about FM. Denies VB, LOF, or abdominal pain.  Onset of complaint: today  Pain score: 0/10  Vitals:   07/04/20 1726  BP: 112/74  Pulse: 68  Resp: 16  Temp: 98.3 F (36.8 C)  SpO2: 100%     FHT: 156  Lab orders placed from triage: none

## 2020-07-04 NOTE — MAU Provider Note (Addendum)
History     CSN: [id number]  Arrival date and time: 07/04/20 1705   First Provider Initiated Contact with Patient 07/04/20 1911      Chief Complaint  Patient presents with  . Covid exposure   HPI Erin Dennis is a 39 y.o. G3P0020 at [redacted]w[redacted]d who presents with chest tightness and a COVID exposure. Patient states her step daughter took an at home COVID test that was positive. She states since she found out, she has felt like her chest is tight. She denies any cough, sore throat, shortness of breath or loss of taste or smell. She also reports decreased fetal movement. She denies abdominal pain, vaginal bleeding or leaking of fluid.   OB History    Gravida  3   Para      Term      Preterm      AB  2   Living        SAB  1   TAB      Ectopic  1   Multiple      Live Births              Past Medical History:  Diagnosis Date  . Anxiety   . Darier disease   . Depression     Past Surgical History:  Procedure Laterality Date  . APPENDECTOMY    . CHOLECYSTECTOMY    . DILATION AND CURETTAGE OF UTERUS     for menorrhagia  . DILATION AND CURETTAGE OF UTERUS N/A 10/07/2019   Procedure: SUCTION DILATATION AND CURETTAGE;  Surgeon: [name], MD;  Location: [facility];  Service: Gynecology;  Laterality: N/A;  Dr. Request Time 9:30am  . OVARIAN CYST SURGERY     x3  . TONSILLECTOMY    . TONSILLECTOMY  2007  . WISDOM TOOTH EXTRACTION      Family History  Problem Relation Age of Onset  . Diabetes Mother   . Hypertension Mother   . Diabetes Father   . Stroke Father   . Cancer Maternal Grandmother        ovarian    Social History   Tobacco Use  . Smoking status: Never Smoker  . Smokeless tobacco: Never Used  Vaping Use  . Vaping Use: Never used  Substance Use Topics  . Alcohol use: Not Currently  . Drug use: Not Currently    Allergies:  Allergies  Allergen Reactions  . Acetaminophen Anaphylaxis  . Sulfamethoxazole-Trimethoprim Other (See Comments)     Blisters  . Tape Rash    Medications Prior to Admission  Medication Sig Dispense Refill Last Dose  . Prenatal Vit-Fe Fumarate-FA (MULTIVITAMIN-PRENATAL) 27-0.8 MG TABS tablet Take 1 tablet by mouth daily at 12 noon.   07/03/2020 at Unknown time  . Blood Pressure Monitor MISC For regular home bp monitoring during pregnancy (Patient not taking: Reported on 04/15/2020) 1 each 0     Review of Systems  Constitutional: Negative.  Negative for fatigue and fever.  HENT: Negative.   Respiratory: Positive for chest tightness. Negative for shortness of breath.   Cardiovascular: Negative.  Negative for chest pain.  Gastrointestinal: Negative.  Negative for abdominal pain, constipation, diarrhea, nausea and vomiting.  Genitourinary: Negative.  Negative for dysuria, vaginal bleeding and vaginal discharge.  Neurological: Negative.  Negative for dizziness and headaches.   Physical Exam   Blood pressure 112/74, pulse 68, temperature 98.3 F (36.8 C), temperature source Oral, resp. rate 16, height 5\' 1"  (1.549 m), weight 68.1 kg, last menstrual  period 01/22/2020, SpO2 100 %.  Physical Exam Vitals and nursing note reviewed.  Constitutional:      General: She is not in acute distress.    Appearance: She is well-developed.  HENT:     Head: Normocephalic.  Eyes:     Pupils: Pupils are equal, round, and reactive to light.  Cardiovascular:     Rate and Rhythm: Normal rate and regular rhythm.     Heart sounds: Normal heart sounds.  Pulmonary:     Effort: Pulmonary effort is normal. No respiratory distress.     Breath sounds: Normal breath sounds.  Abdominal:     General: Bowel sounds are normal. There is no distension.     Palpations: Abdomen is soft.     Tenderness: There is no abdominal tenderness.  Skin:    General: Skin is warm and dry.  Neurological:     Mental Status: She is alert and oriented to person, place, and time.  Psychiatric:        Behavior: Behavior normal.        Thought  Content: Thought content normal.        Judgment: Judgment normal.    Fetal Tracing:  Baseline: 145 Variability: moderate Accels: none Decels: none  Toco: none   MAU Course  Procedures Results for orders placed or performed during the hospital encounter of 07/04/20 (from the past 24 hour(s))  Resp Panel by RT-PCR (Flu A&B, Covid) Nasopharyngeal Swab     Status: None   Collection Time: 07/04/20  6:00 PM   Specimen: Nasopharyngeal Swab; Nasopharyngeal(NP) swabs in vial transport medium  Result Value Ref Range   SARS Coronavirus 2 by RT PCR NEGATIVE NEGATIVE   Influenza A by PCR NEGATIVE NEGATIVE   Influenza B by PCR NEGATIVE NEGATIVE     MDM COVID-19 swab NST reassuring for gestational age  Care turned over to Milinda Cave CNM at 2000.  Wende Mott, CNM 07/04/20 7:59 PM   Assessment and Plan   Reassessment (8:48 PM) Results return negative Patient informed of results Discussed isolation from daughter for at least 10 days from positive results. Q/C addressed regarding when to return for symptoms of concern. Discharged to home in stable condition.  Maryann Conners MSN, CNM Advanced Practice Provider, Center for [employer] [facility]

## 2020-07-04 NOTE — Discharge Instructions (Signed)

## 2020-07-29 ENCOUNTER — Other Ambulatory Visit: Payer: Self-pay

## 2020-07-29 ENCOUNTER — Ambulatory Visit (INDEPENDENT_AMBULATORY_CARE_PROVIDER_SITE_OTHER): Payer: Medicaid Other | Admitting: Advanced Practice Midwife

## 2020-07-29 ENCOUNTER — Other Ambulatory Visit: Payer: Medicaid Other

## 2020-07-29 VITALS — BP 104/65 | HR 78 | Wt 151.0 lb

## 2020-07-29 DIAGNOSIS — Z1389 Encounter for screening for other disorder: Secondary | ICD-10-CM

## 2020-07-29 DIAGNOSIS — Z3482 Encounter for supervision of other normal pregnancy, second trimester: Secondary | ICD-10-CM

## 2020-07-29 DIAGNOSIS — Z23 Encounter for immunization: Secondary | ICD-10-CM

## 2020-07-29 DIAGNOSIS — Z331 Pregnant state, incidental: Secondary | ICD-10-CM

## 2020-07-29 DIAGNOSIS — Z3A26 26 weeks gestation of pregnancy: Secondary | ICD-10-CM

## 2020-07-29 DIAGNOSIS — Z3480 Encounter for supervision of other normal pregnancy, unspecified trimester: Secondary | ICD-10-CM

## 2020-07-29 DIAGNOSIS — Z3A27 27 weeks gestation of pregnancy: Secondary | ICD-10-CM

## 2020-07-29 LAB — POCT URINALYSIS DIPSTICK OB
Blood, UA: NEGATIVE
Glucose, UA: NEGATIVE
Ketones, UA: NEGATIVE
Leukocytes, UA: NEGATIVE
Nitrite, UA: NEGATIVE

## 2020-07-29 NOTE — Progress Notes (Signed)
   LOW-RISK PREGNANCY VISIT Patient name: Erin Dennis MRN [id number]  Date of birth: October 16, 1980 Chief Complaint:   Routine Prenatal Visit  History of Present Illness:   Erin Dennis is a 39 y.o. G47P0020 female at [redacted]w[redacted]d with an Estimated Date of Delivery: 10/28/20 being seen today for ongoing management of a low-risk pregnancy.  Today she reports doing well; feels better since feeling the baby move more often. Contractions: Not present. Vag. Bleeding: None.  Movement: Present. denies leaking of fluid. Review of Systems:   Pertinent items are noted in HPI Denies abnormal vaginal discharge w/ itching/odor/irritation, headaches, visual changes, shortness of breath, chest pain, abdominal pain, severe nausea/vomiting, or problems with urination or bowel movements unless otherwise stated above. Pertinent History Reviewed:  Reviewed past medical,surgical, social, obstetrical and family history.  Reviewed problem list, medications and allergies. Physical Assessment:   Vitals:   07/29/20 0855  BP: 104/65  Pulse: 78  Weight: 151 lb (68.5 kg)  Body mass index is 28.53 kg/m.        Physical Examination:   General appearance: Well appearing, and in no distress  Mental status: Alert, oriented to person, place, and time  Skin: Warm & dry  Cardiovascular: Normal heart rate noted  Respiratory: Normal respiratory effort, no distress  Abdomen: Soft, gravid, nontender  Pelvic: Cervical exam deferred         Extremities: Edema: None  Fetal Status: Fetal Heart Rate (bpm): 145 Fundal Height: 27 cm Movement: Present    Results for orders placed or performed in visit on 07/29/20 (from the past 24 hour(s))  POC Urinalysis Dipstick OB   Collection Time: 07/29/20  8:55 AM  Result Value Ref Range   Color, UA     Clarity, UA     Glucose, UA Negative Negative   Bilirubin, UA     Ketones, UA neg    Spec Grav, UA     Blood, UA neg    pH, UA     POC,PROTEIN,UA Trace Negative, Trace, Small (1+),  Moderate (2+), Large (3+), 4+   Urobilinogen, UA     Nitrite, UA neg    Leukocytes, UA Negative Negative   Appearance     Odor      Assessment & Plan:  1) Low-risk pregnancy G3P0020 at [redacted]w[redacted]d with an Estimated Date of Delivery: 10/28/20   2) Desires ppBTL, will sign 30d papers at next visit   Meds: No orders of the defined types were placed in this encounter.  Labs/procedures today: Tdap & PN2  Plan:  Continue routine obstetrical care   Reviewed: Preterm labor symptoms and general obstetric precautions including but not limited to vaginal bleeding, contractions, leaking of fluid and fetal movement were reviewed in detail with the patient.  All questions were answered. Didn't ask about home bp cuff.  Check bp weekly, let us know if >140/90.   Follow-up: Return in about 3 weeks (around 08/19/2020) for [place], in person.  Orders Placed This Encounter  Procedures  . Tdap vaccine greater than or equal to 7yo IM  . POC Urinalysis Dipstick OB   Myrtis Ser [facility] 07/29/2020 9:21 AM

## 2020-07-29 NOTE — Patient Instructions (Signed)
Erin Dennis, I greatly value your feedback.  If you receive a survey following your visit with [place] today, we appreciate you taking the time to fill it out.  Thanks, Derrill Memo CNM   [name] at [facility] ([place], [place] [address]) Entrance C, located off of [address] parking  Go to [email or web address] to register for FREE online childbirth classes   Call the office [phone]) or go to [facility] if:  You begin to have strong, frequent contractions  Your water breaks.  Sometimes it is a big gush of fluid, sometimes it is just a trickle that keeps getting your panties wet or running down your legs  You have vaginal bleeding.  It is normal to have a small amount of spotting if your cervix was checked.   You don't feel your baby moving like normal.  If you don't, get you something to eat and drink and lay down and focus on feeling your baby move.  You should feel at least 10 movements in 2 hours.  If you don't, you should call the office or go to [facility].    Tdap Vaccine  It is recommended that you get the Tdap vaccine during the third trimester of EACH pregnancy to help protect your baby from getting pertussis (whooping cough)  27-36 weeks is the BEST time to do this so that you can pass the protection on to your baby. During pregnancy is better than after pregnancy, but if you are unable to get it during pregnancy it will be offered at the hospital.   You can get this vaccine with [place], at the health department, your family doctor, or some local pharmacies  Everyone who will be around your baby should also be up-to-date on their vaccines before the baby comes. Adults (who are not pregnant) only need 1 dose of Tdap during adulthood.   [place] Pediatricians/Family Doctors:  [facility] [phone]                 [place] [phone]  (usually not accepting new patients unless you have family there already, you are always welcome to call and ask)       [facility] [phone]       [facility] Pediatricians/Family Doctors:   [facility]: [phone]  [facility]: [phone]  [facility]: [place] Doctors:   [facility]: [facility]: [place]:  [place]: [phone]   Home Blood Pressure Monitoring for Patients   Your provider has recommended that you check your blood pressure (BP) at least once a week at home. If you do not have a blood pressure cuff at home, one will be provided for you. Contact your provider if you have not received your monitor within 1 week.   Helpful Tips for Accurate Home Blood Pressure Checks  . Don't smoke, exercise, or drink caffeine 30 minutes before checking your BP . Use the restroom before checking your BP (a full bladder can raise your pressure) . Relax in a comfortable upright chair . Feet on the ground . Left arm resting comfortably on a flat surface at the level of your heart . Legs uncrossed . Back supported . Sit quietly and don't talk . Place the cuff on your bare arm .  Adjust snuggly, so that only two fingertips can fit between your skin and the top of the cuff . Check 2 readings separated by at least one minute . Keep a log of your BP readings . For a visual, please reference this diagram: http://ccnc.care/bpdiagram  Provider Name: [facility]     Phone: [phone]  Zone 1: ALL CLEAR  Continue to monitor your symptoms:  . BP reading is less than 140 (top number) or less than 90 (bottom number)  . No right upper stomach pain . No headaches or seeing spots . No feeling nauseated or throwing up . No swelling in face and hands  Zone 2: CAUTION Call your doctor's office for  any of the following:  . BP reading is greater than 140 (top number) or greater than 90 (bottom number)  . Stomach pain under your ribs in the middle or right side . Headaches or seeing spots . Feeling nauseated or throwing up . Swelling in face and hands  Zone 3: EMERGENCY  Seek immediate medical care if you have any of the following:  . BP reading is greater than160 (top number) or greater than 110 (bottom number) . Severe headaches not improving with Tylenol . Serious difficulty catching your breath . Any worsening symptoms from Zone 2   Third Trimester of Pregnancy The third trimester is from week 29 through week 42, months 7 through 9. The third trimester is a time when the fetus is growing rapidly. At the end of the ninth month, the fetus is about 20 inches in length and weighs 6-10 pounds.  BODY CHANGES Your body goes through many changes during pregnancy. The changes vary from woman to woman.   Your weight will continue to increase. You can expect to gain 25-35 pounds (11-16 kg) by the end of the pregnancy.  You may begin to get stretch marks on your hips, abdomen, and breasts.  You may urinate more often because the fetus is moving lower into your pelvis and pressing on your bladder.  You may develop or continue to have heartburn as a result of your pregnancy.  You may develop constipation because certain hormones are causing the muscles that push waste through your intestines to slow down.  You may develop hemorrhoids or swollen, bulging veins (varicose veins).  You may have pelvic pain because of the weight gain and pregnancy hormones relaxing your joints between the bones in your pelvis. Backaches may result from overexertion of the muscles supporting your posture.  You may have changes in your hair. These can include thickening of your hair, rapid growth, and changes in texture. Some women also have hair loss during or after pregnancy, or hair that feels dry or thin.  Your hair will most likely return to normal after your baby is born.  Your breasts will continue to grow and be tender. A yellow discharge may leak from your breasts called colostrum.  Your belly button may stick out.  You may feel short of breath because of your expanding uterus.  You may notice the fetus "dropping," or moving lower in your abdomen.  You may have a bloody mucus discharge. This usually occurs a few days to a week before labor begins.  Your cervix becomes thin and soft (effaced) near your due date. WHAT TO EXPECT AT YOUR PRENATAL EXAMS  You will have prenatal exams every 2 weeks until week 36. Then, you will have weekly prenatal exams. During a routine prenatal visit:  You will be  weighed to make sure you and the fetus are growing normally.  Your blood pressure is taken.  Your abdomen will be measured to track your baby's growth.  The fetal heartbeat will be listened to.  Any test results from the previous visit will be discussed.  You may have a cervical check near your due date to see if you have effaced. At around 36 weeks, your caregiver will check your cervix. At the same time, your caregiver will also perform a test on the secretions of the vaginal tissue. This test is to determine if a type of bacteria, Group B streptococcus, is present. Your caregiver will explain this further. Your caregiver may ask you:  What your birth plan is.  How you are feeling.  If you are feeling the baby move.  If you have had any abnormal symptoms, such as leaking fluid, bleeding, severe headaches, or abdominal cramping.  If you have any questions. Other tests or screenings that may be performed during your third trimester include:  Blood tests that check for low iron levels (anemia).  Fetal testing to check the health, activity level, and growth of the fetus. Testing is done if you have certain medical conditions or if there are problems during the pregnancy. FALSE  LABOR You may feel small, irregular contractions that eventually go away. These are called Braxton Hicks contractions, or false labor. Contractions may last for hours, days, or even weeks before true labor sets in. If contractions come at regular intervals, intensify, or become painful, it is best to be seen by your caregiver.  SIGNS OF LABOR   Menstrual-like cramps.  Contractions that are 5 minutes apart or less.  Contractions that start on the top of the uterus and spread down to the lower abdomen and back.  A sense of increased pelvic pressure or back pain.  A watery or bloody mucus discharge that comes from the vagina. If you have any of these signs before the 37th week of pregnancy, call your caregiver right away. You need to go to the hospital to get checked immediately. HOME CARE INSTRUCTIONS   Avoid all smoking, herbs, alcohol, and unprescribed drugs. These chemicals affect the formation and growth of the baby.  Follow your caregiver's instructions regarding medicine use. There are medicines that are either safe or unsafe to take during pregnancy.  Exercise only as directed by your caregiver. Experiencing uterine cramps is a good sign to stop exercising.  Continue to eat regular, healthy meals.  Wear a good support bra for breast tenderness.  Do not use hot tubs, steam rooms, or saunas.  Wear your seat belt at all times when driving.  Avoid raw meat, uncooked cheese, cat litter boxes, and soil used by cats. These carry germs that can cause birth defects in the baby.  Take your prenatal vitamins.  Try taking a stool softener (if your caregiver approves) if you develop constipation. Eat more high-fiber foods, such as fresh vegetables or fruit and whole grains. Drink plenty of fluids to keep your urine clear or pale yellow.  Take warm sitz baths to soothe any pain or discomfort caused by hemorrhoids. Use hemorrhoid cream if your caregiver approves.  If you develop varicose  veins, wear support hose. Elevate your feet for 15 minutes, 3-4 times a day. Limit salt in your diet.  Avoid heavy lifting, wear low heal shoes, and practice good posture.  Rest a lot with your legs elevated if you have leg cramps or low back pain.  Visit your dentist if you have not gone during your pregnancy. Use a soft toothbrush to brush your teeth and be gentle when you floss.  A sexual relationship may be continued unless your caregiver directs you otherwise.  Do not travel far distances unless it is absolutely necessary and only with the approval of your caregiver.  Take prenatal classes to understand, practice, and ask questions about the labor and delivery.  Make a trial run to the hospital.  Pack your hospital bag.  Prepare the baby's nursery.  Continue to go to all your prenatal visits as directed by your caregiver. SEEK MEDICAL CARE IF:  You are unsure if you are in labor or if your water has broken.  You have dizziness.  You have mild pelvic cramps, pelvic pressure, or nagging pain in your abdominal area.  You have persistent nausea, vomiting, or diarrhea.  You have a bad smelling vaginal discharge.  You have pain with urination. SEEK IMMEDIATE MEDICAL CARE IF:   You have a fever.  You are leaking fluid from your vagina.  You have spotting or bleeding from your vagina.  You have severe abdominal cramping or pain.  You have rapid weight loss or gain.  You have shortness of breath with chest pain.  You notice sudden or extreme swelling of your face, hands, ankles, feet, or legs.  You have not felt your baby move in over an hour.  You have severe headaches that do not go away with medicine.  You have vision changes. Document Released: 07/12/2001 Document Revised: 07/23/2013 Document Reviewed: 09/18/2012 [facility] Patient Information 2015 [place], [place]. This information is not intended to replace advice given to you by your health care provider. Make  sure you discuss any questions you have with your health care provider.

## 2020-07-30 ENCOUNTER — Telehealth: Payer: Self-pay | Admitting: Advanced Practice Midwife

## 2020-07-30 ENCOUNTER — Encounter: Payer: Medicaid Other | Admitting: Advanced Practice Midwife

## 2020-07-30 ENCOUNTER — Other Ambulatory Visit: Payer: Medicaid Other

## 2020-07-30 ENCOUNTER — Other Ambulatory Visit: Payer: Self-pay | Admitting: *Deleted

## 2020-07-30 DIAGNOSIS — Z8632 Personal history of gestational diabetes: Secondary | ICD-10-CM | POA: Insufficient documentation

## 2020-07-30 DIAGNOSIS — O0992 Supervision of high risk pregnancy, unspecified, second trimester: Secondary | ICD-10-CM

## 2020-07-30 DIAGNOSIS — O24410 Gestational diabetes mellitus in pregnancy, diet controlled: Secondary | ICD-10-CM

## 2020-07-30 LAB — CBC
Hematocrit: 36.4 % (ref 34.0–46.6)
Hemoglobin: 12.4 g/dL (ref 11.1–15.9)
MCH: 32.1 pg (ref 26.6–33.0)
MCHC: 34.1 g/dL (ref 31.5–35.7)
MCV: 94 fL (ref 79–97)
Platelets: 216 10*3/uL (ref 150–450)
RBC: 3.86 x10E6/uL (ref 3.77–5.28)
RDW: 12.6 % (ref 11.7–15.4)
WBC: 10.3 10*3/uL (ref 3.4–10.8)

## 2020-07-30 LAB — GLUCOSE TOLERANCE, 2 HOURS W/ 1HR
Glucose, 1 hour: 247 mg/dL — ABNORMAL HIGH (ref 65–179)
Glucose, 2 hour: 154 mg/dL — ABNORMAL HIGH (ref 65–152)
Glucose, Fasting: 100 mg/dL — ABNORMAL HIGH (ref 65–91)

## 2020-07-30 LAB — RPR: RPR Ser Ql: NONREACTIVE

## 2020-07-30 LAB — ANTIBODY SCREEN: Antibody Screen: NEGATIVE

## 2020-07-30 LAB — HIV ANTIBODY (ROUTINE TESTING W REFLEX): HIV Screen 4th Generation wRfx: NONREACTIVE

## 2020-07-30 MED ORDER — ACCU-CHEK GUIDE VI STRP: ORAL_STRIP | 12 refills | Status: DC

## 2020-07-30 MED ORDER — ACCU-CHEK SOFTCLIX LANCETS MISC: 12 refills | Status: DC

## 2020-07-30 MED ORDER — ACCU-CHEK GUIDE ME W/DEVICE KIT: 1.0000 | PACK | Freq: Four times a day (QID) | 0 refills | Status: DC

## 2020-07-30 NOTE — Telephone Encounter (Signed)
Patient wants a call back from [facility] to discuss test results. Clinical staff will follow up with patient.

## 2020-08-01 DIAGNOSIS — N812 Incomplete uterovaginal prolapse: Secondary | ICD-10-CM

## 2020-08-01 DIAGNOSIS — Z8632 Personal history of gestational diabetes: Secondary | ICD-10-CM

## 2020-08-01 HISTORY — DX: Personal history of gestational diabetes: Z86.32

## 2020-08-01 HISTORY — DX: Incomplete uterovaginal prolapse: N81.2

## 2020-08-01 NOTE — L&D Delivery Note (Signed)
Operative Delivery Note and Liletta IUD Insertion At 1:53 PM a viable female was delivered via Vaginal, low Forceps.  Presentation: vertex; Position: Left,, Occiput,, Posterior; Station: +2.  Patient examined and baby LOA, EFW 3400gm, pelvis felt adequate. Patient pushing well on her side but with deep and prolonged decels for several minutes with each push. I discussed with her that I felt operative vaginal delivery via forceps was best for her and baby and she agreed after discussion with her re: risks, benefits, and alternatives.  Verbal consent: obtained from patient.  Risks and benefits discussed in detail.  Risks include, but are not limited to the risks of anesthesia, bleeding, infection, damage to maternal tissues, fetal cephalhematoma.  There is also the risk of inability to effect vaginal delivery of the head, or shoulder dystocia that cannot be resolved by established maneuvers, leading to the need for emergency cesarean section.  With the NICU team in the room and her epidural felt to be adequate, Luikart Simpson forceps were placed without difficulty. Over two contractions, the patient delivery a baby boy that was ROP. Forceps disarticulated prior to full fetal head delivery. The head then delivered and the rest of the body easily.  The cord was clamped and cut and handed to the awaiting NICU team.   Placenta was then delivered without difficulty.    Liletta IUD was then inserted by Dr. Sylvester Harder in the usual fashion and the strings cut to the introitus.    Her repair was then done in the usual fashion with 2-0 vicryl.   APGAR: 8/9, ; weight 3487gm   Placenta status: delivered. Intact  Cord pH: Arterial 7.29/Venous 7.329  Anesthesia: epidural Episiotomy: None Lacerations: 2nd degree;Labial. Rectal exam pre and post repair negative. Right peri-uretheral  Suture Repair: 2.0 vicryl Est. Blood Loss (mL):  281m  Mom to postpartum.  Baby to Couplet care / Skin to Skin.  CAletha Halim3/17/2022, 2:15 PM

## 2020-08-11 ENCOUNTER — Ambulatory Visit (INDEPENDENT_AMBULATORY_CARE_PROVIDER_SITE_OTHER): Payer: Medicaid Other | Admitting: *Deleted

## 2020-08-11 ENCOUNTER — Other Ambulatory Visit: Payer: Self-pay

## 2020-08-11 VITALS — BP 114/77 | HR 78

## 2020-08-11 DIAGNOSIS — O368130 Decreased fetal movements, third trimester, not applicable or unspecified: Secondary | ICD-10-CM

## 2020-08-11 NOTE — Progress Notes (Addendum)
   NURSE VISIT- NST  SUBJECTIVE:  Erin Dennis is a 40 y.o. G3P0020 female at [redacted]w[redacted]d, here for a NST for pregnancy complicated by Decreased fetal movement.  She reports active fetal movement, contractions: none, vaginal bleeding: none, membranes: intact.   OBJECTIVE:  BP 114/77   Pulse 78   LMP 01/22/2020   Appears well, no apparent distress  No results found for this or any previous visit (from the past 24 hour(s)).  NST: FHR baseline 130 bpm, Variability: moderate, Accelerations:present, Decelerations:  Absent= Cat 1/Reactive Toco: none   ASSESSMENT: G3P0020 at [redacted]w[redacted]d with Decreased fetal movement NST reactive  PLAN: EFM strip reviewed by Dr. Elonda Husky   Recommendations: keep next appointment as scheduled    Alice Rieger  08/11/2020 2:51 PM  Attestation of Attending Supervision of Nursing Visit Encounter: Evaluation and management procedures were performed by the nursing staff under my supervision and collaboration.  I have reviewed the nurse's note and chart, and I agree with the management and plan.  Jacelyn Grip MD Attending Physician for the Center for [facility] 08/11/2020 4:13 PM

## 2020-08-12 ENCOUNTER — Other Ambulatory Visit: Payer: Self-pay | Admitting: Advanced Practice Midwife

## 2020-08-12 ENCOUNTER — Encounter: Payer: Self-pay | Admitting: Registered"

## 2020-08-12 ENCOUNTER — Other Ambulatory Visit: Payer: Self-pay

## 2020-08-12 ENCOUNTER — Encounter: Payer: Medicaid Other | Attending: Advanced Practice Midwife | Admitting: Registered"

## 2020-08-12 DIAGNOSIS — O24419 Gestational diabetes mellitus in pregnancy, unspecified control: Secondary | ICD-10-CM | POA: Insufficient documentation

## 2020-08-12 DIAGNOSIS — O24410 Gestational diabetes mellitus in pregnancy, diet controlled: Secondary | ICD-10-CM

## 2020-08-12 NOTE — Progress Notes (Signed)
Patient was seen on 08/12/20 for Gestational Diabetes self-management. EDD 10/28/20; [redacted]w[redacted]d Patient states no history of GDM. Diet history obtained. Patient eats variety of some food groups. Patient states she doesn't eat enough vegetables. Beverages include water, diluted sweet tea, gatorade.  Patient reports 30 min daily structured physical activity. Pt reports family history of T2DM and has been sporadically checking her blood sugar.  The following learning objectives were met by the patient :   States the definition of Gestational Diabetes  States why dietary management is important in controlling blood glucose  Describes the effects of carbohydrates on blood glucose levels  Demonstrates ability to create a balanced meal plan  Demonstrates carbohydrate counting   States when to check blood glucose levels  Demonstrates proper blood glucose monitoring techniques  States the effect of stress and exercise on blood glucose levels  States the importance of limiting caffeine and abstaining from alcohol and smoking  Plan:  Aim for 3 Carbohydrate Choices per meal (45 grams) +/- 1 either way  Aim for 1-2 Carbohydrate Choices per snack Begin reading food labels for Total Carbohydrate of foods If OK with your MD, consider  increasing your activity level by walking, Arm Chair Exercises or other activity daily as tolerated Begin checking Blood Glucose before breakfast and 2 hours after first bite of breakfast, lunch and dinner as directed by MD  Bring Log Book/Sheet and meter to every medical appointment  Baby Scripts: (BS 2.0 not capable of glucose management at this time.) Patient to record blood sugar on glucose log sheet  Take medication if directed by MD  Patient already has a meter, is testing pre breakfast and 2 hours after each meal. Pt reported CBGs: elevated FBS 98-100 mg/dL post-meals mostly WNL.  Patient instructed to monitor glucose levels: FBS: 60 - 90 mg/dl 2 hour: <120  mg/dl  Patient received the following handouts:  Nutrition Diabetes and Pregnancy  Carbohydrate Counting List  Blood glucose Log Sheet  Patient will be seen for follow-up as needed.

## 2020-08-19 ENCOUNTER — Ambulatory Visit (INDEPENDENT_AMBULATORY_CARE_PROVIDER_SITE_OTHER): Payer: Medicaid Other | Admitting: Obstetrics and Gynecology

## 2020-08-19 ENCOUNTER — Encounter: Payer: Self-pay | Admitting: Obstetrics and Gynecology

## 2020-08-19 ENCOUNTER — Other Ambulatory Visit: Payer: Self-pay

## 2020-08-19 VITALS — BP 115/77 | HR 77 | Wt 148.6 lb

## 2020-08-19 DIAGNOSIS — Z1389 Encounter for screening for other disorder: Secondary | ICD-10-CM

## 2020-08-19 DIAGNOSIS — Z3A30 30 weeks gestation of pregnancy: Secondary | ICD-10-CM

## 2020-08-19 DIAGNOSIS — Z302 Encounter for sterilization: Secondary | ICD-10-CM

## 2020-08-19 DIAGNOSIS — O0990 Supervision of high risk pregnancy, unspecified, unspecified trimester: Secondary | ICD-10-CM

## 2020-08-19 DIAGNOSIS — O0993 Supervision of high risk pregnancy, unspecified, third trimester: Secondary | ICD-10-CM

## 2020-08-19 DIAGNOSIS — O09513 Supervision of elderly primigravida, third trimester: Secondary | ICD-10-CM

## 2020-08-19 DIAGNOSIS — O24410 Gestational diabetes mellitus in pregnancy, diet controlled: Secondary | ICD-10-CM

## 2020-08-19 LAB — POCT URINALYSIS DIPSTICK OB
Blood, UA: NEGATIVE
Glucose, UA: NEGATIVE
Ketones, UA: NEGATIVE
Leukocytes, UA: NEGATIVE
Nitrite, UA: NEGATIVE

## 2020-08-19 NOTE — Progress Notes (Signed)
Subjective:  Erin Dennis is a 40 y.o. G3P0020 at 44w0dbeing seen today for ongoing prenatal care.  She is currently monitored for the following issues for this high-risk pregnancy and has Depression; Supervision of high-risk pregnancy; AMA (advanced maternal age) primigravida 35+; Request for sterilization; and Gestational diabetes on their problem list.  Patient reports no complaints.  Contractions: Irritability. Vag. Bleeding: None.  Movement: Present. Denies leaking of fluid.   The following portions of the patient's history were reviewed and updated as appropriate: allergies, current medications, past family history, past medical history, past social history, past surgical history and problem list. Problem list updated.  Objective:   Vitals:   08/19/20 0834  BP: 115/77  Pulse: 77  Weight: 148 lb 9.6 oz (67.4 kg)    Fetal Status:     Movement: Present     General:  Alert, oriented and cooperative. Patient is in no acute distress.  Skin: Skin is warm and dry. No rash noted.   Cardiovascular: Normal heart rate noted  Respiratory: Normal respiratory effort, no problems with respiration noted  Abdomen: Soft, gravid, appropriate for gestational age. Pain/Pressure: Present     Pelvic:  Cervical exam deferred        Extremities: Normal range of motion.  Edema: None  Mental Status: Normal mood and affect. Normal behavior. Normal judgment and thought content.   Urinalysis:      Assessment and Plan:  Pregnancy: G3P0020 at 317w0d1. High risk pregnancy, antepartum  - POC Urinalysis Dipstick OB  2. Screening for genitourinary condition  - POC Urinalysis Dipstick OB  3. [redacted] weeks gestation of pregnancy   4. Supervision of high risk pregnancy in third trimester Stable  5. Diet controlled gestational diabetes mellitus (GDM) in third trimester GDM and pregnancy reviewed with pt CBG's reviewed. In goal range for the most part except for fasting. Discussed importance of following  diet  6. Request for sterilization BTL papers signed today  7. Primigravida of advanced maternal age in third trimester Stable  Preterm labor symptoms and general obstetric precautions including but not limited to vaginal bleeding, contractions, leaking of fluid and fetal movement were reviewed in detail with the patient. Please refer to After Visit Summary for other counseling recommendations.  Return in about 2 weeks (around 09/02/2020) for OB visit, face to face, MD only.   ErChancy MilroyMD

## 2020-08-26 ENCOUNTER — Ambulatory Visit (INDEPENDENT_AMBULATORY_CARE_PROVIDER_SITE_OTHER): Payer: Medicaid Other | Admitting: *Deleted

## 2020-08-26 ENCOUNTER — Other Ambulatory Visit: Payer: Self-pay

## 2020-08-26 DIAGNOSIS — O24410 Gestational diabetes mellitus in pregnancy, diet controlled: Secondary | ICD-10-CM

## 2020-08-26 DIAGNOSIS — O0993 Supervision of high risk pregnancy, unspecified, third trimester: Secondary | ICD-10-CM | POA: Diagnosis not present

## 2020-08-26 NOTE — Progress Notes (Addendum)
   NURSE VISIT- NST  SUBJECTIVE:  Erin Dennis is a 40 y.o. G3P0020 female at [redacted]w[redacted]d here for a NST for pregnancy complicated by Decreased fetal movement.  She reports decreased  fetal movement, contractions: none, vaginal bleeding: none, membranes: intact.   OBJECTIVE:  BP 113/77   Pulse 70   LMP 01/22/2020   Appears well, no apparent distress  No results found for this or any previous visit (from the past 24 hour(s)).  NST: FHR baseline 130 bpm, Variability: moderate, Accelerations:present, Decelerations:  Absent= Cat 1/Reactive Toco: none   ASSESSMENT: G3P0020 at 370w0dith Decreased fetal movement NST reactive  PLAN: EFM strip reviewed by KiDerrill MemoCNM   Recommendations: keep next appointment as scheduled    LaAlice Rieger1/26/2022 4:34 PM  Chart reviewed for nurse visit. Agree with plan of care.  ShMyrtis SerCNM 08/26/2020 4:55 PM

## 2020-09-03 ENCOUNTER — Other Ambulatory Visit: Payer: Self-pay

## 2020-09-03 ENCOUNTER — Ambulatory Visit (INDEPENDENT_AMBULATORY_CARE_PROVIDER_SITE_OTHER): Payer: Medicaid Other | Admitting: Women's Health

## 2020-09-03 ENCOUNTER — Encounter: Payer: Self-pay | Admitting: Women's Health

## 2020-09-03 VITALS — BP 114/74 | HR 76

## 2020-09-03 DIAGNOSIS — Z3A32 32 weeks gestation of pregnancy: Secondary | ICD-10-CM

## 2020-09-03 DIAGNOSIS — O24410 Gestational diabetes mellitus in pregnancy, diet controlled: Secondary | ICD-10-CM

## 2020-09-03 DIAGNOSIS — Q828 Other specified congenital malformations of skin: Secondary | ICD-10-CM

## 2020-09-03 DIAGNOSIS — O0993 Supervision of high risk pregnancy, unspecified, third trimester: Secondary | ICD-10-CM

## 2020-09-03 LAB — POCT URINALYSIS DIPSTICK OB
Blood, UA: NEGATIVE
Glucose, UA: NEGATIVE
Ketones, UA: NEGATIVE
Leukocytes, UA: NEGATIVE
Nitrite, UA: NEGATIVE
POC,PROTEIN,UA: NEGATIVE

## 2020-09-03 NOTE — Progress Notes (Signed)
HIGH-RISK PREGNANCY VISIT Patient name: Aviv Salz MRN [id number]  Date of birth: 08-23-1980 Chief Complaint:   Routine Prenatal Visit  History of Present Illness:   Erin Dennis is a 40 y.o. G45P0020 female at 45w1dwith an Estimated Date of Delivery: 10/28/20 being seen today for ongoing management of a high-risk pregnancy complicated by diabetes mellitus A1DM.  Today she reports majority of sugars normal (only 3 FBS and 3pp >goal) since 1/19.  [facility] 08/12/2020 04/15/2020 12/06/2019  Decreased Interest 0 0 1  Down, Depressed, Hopeless 0 0 1  PHQ - 2 Score 0 0 2  Altered sleeping - 1 3  Tired, decreased energy - 1 2  Change in appetite - 1 1  Feeling bad or failure about yourself  - 0 0  Trouble concentrating - 0 0  Moving slowly or fidgety/restless - 0 0  Suicidal thoughts - 0 0  PHQ-9 Score - 3 8    Contractions: Irritability. Vag. Bleeding: None.  Movement: Present. denies leaking of fluid.  Review of Systems:   Pertinent items are noted in HPI Denies abnormal vaginal discharge w/ itching/odor/irritation, headaches, visual changes, shortness of breath, chest pain, abdominal pain, severe nausea/vomiting, or problems with urination or bowel movements unless otherwise stated above. Pertinent History Reviewed:  Reviewed past medical,surgical, social, obstetrical and family history.  Reviewed problem list, medications and allergies. Physical Assessment:   Vitals:   09/03/20 0844  BP: 114/74  Pulse: 76  There is no height or weight on file to calculate BMI.           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 Doesn't really bother her  Pelvic: Cervical exam deferred         Fetal Status: Fetal Heart Rate (bpm): 150 Fundal Height: 30 cm Movement: Present    Fetal Surveillance Testing today: doppler   Chaperone: N/A    Results for orders placed or performed in visit on 09/03/20 (from the past 24 hour(s))  POC Urinalysis Dipstick OB   Collection Time: 09/03/20  8:43 AM  Result Value Ref Range   Color, UA     Clarity, UA     Glucose, UA Negative Negative   Bilirubin, UA     Ketones, UA neg    Spec Grav, UA     Blood, UA neg    pH, UA     POC,PROTEIN,UA Negative Negative, Trace, Small (1+), Moderate (2+), Large (3+), 4+   Urobilinogen, UA     Nitrite, UA neg    Leukocytes, UA Negative Negative   Appearance     Odor      Assessment & Plan:  High-risk pregnancy: G3P0020 at 331w1dith an Estimated Date of Delivery: 10/28/20   1) A1DM, stable  2) Darier-White skin disease, stable, 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   Labs/procedures today: none  Treatment Plan:  EFW @ 36wk, IOL 39-40wk  Reviewed: Preterm labor symptoms and general obstetric precautions including but not limited to vaginal bleeding, contractions, leaking of fluid and fetal movement were reviewed in detail  with the patient.  All questions were answered. Has home bp cuff. Check bp weekly, let us know if >140/90.   Follow-up: Return in about 2 weeks (around 09/17/2020) for [place], in person, MD or CNM.   Future Appointments  Date Time Provider [place]  09/17/2020  9:10 AM Elonda Husky, Mertie Clause, MD [facility]    Orders Placed This Encounter  Procedures  . POC Urinalysis Dipstick OB   [name] CNM, [facility] 09/03/2020 9:01 AM

## 2020-09-03 NOTE — Patient Instructions (Signed)
Geralyn Flash, I greatly value your feedback.  If you receive a survey following your visit with [place] today, we appreciate you taking the time to fill it out.  Thanks, Knute Neu, CNM, WHNP-BC  [facility] [place] [facility] ([place], [place] [address]) Entrance C, located off of [address]   Go to [email or web address] to register for FREE online childbirth classes    Call the office [phone]) or go to [facility] if:  You begin to have strong, frequent contractions  Your water breaks.  Sometimes it is a big gush of fluid, sometimes it is just a trickle that keeps getting your panties wet or running down your legs  You have vaginal bleeding.  It is normal to have a small amount of spotting if your cervix was checked.   You don't feel your baby moving like normal.  If you don't, get you something to eat and drink and lay down and focus on feeling your baby move.  You should feel at least 10 movements in 2 hours.  If you don't, you should call the office or go to [facility].   Call the office [phone]) or go to [facility] hospital for these signs of pre-eclampsia:  Severe headache that does not go away with Tylenol  Visual changes- seeing spots, double, blurred vision  Pain under your right breast or upper abdomen that does not go away with Tums or heartburn medicine  Nausea and/or vomiting  Severe swelling in your hands, feet, and face    Home Blood Pressure Monitoring for Patients   Your provider has recommended that you check your blood pressure (BP) at least once a week at home. If you do not have a blood pressure cuff at home, one will be provided for you. Contact your provider if you have not received your monitor within 1 week.   Helpful Tips for Accurate Home Blood Pressure Checks  . Don't smoke, exercise, or drink caffeine 30 minutes before checking your BP . Use the restroom before checking your BP (a full bladder  can raise your pressure) . Relax in a comfortable upright chair . Feet on the ground . Left arm resting comfortably on a flat surface at the level of your heart . Legs uncrossed . Back supported . Sit quietly and don't talk . Place the cuff on your bare arm . Adjust snuggly, so that only two fingertips can fit between your skin and the top of the cuff . Check 2 readings separated by at least one minute . Keep a log of your BP readings . For a visual, please reference this diagram: http://ccnc.care/bpdiagram  Provider Name: [facility]     Phone: [phone]  Zone 1: ALL CLEAR  Continue to monitor your symptoms:  . BP reading is less than 140 (top number) or less than 90 (bottom number)  . No right upper stomach pain . No headaches or seeing spots . No feeling nauseated or throwing up . No swelling in face and hands  Zone 2: CAUTION Call your doctor's office for any of the following:  . BP reading is greater than 140 (top number) or greater than 90 (bottom number)  . Stomach pain under your ribs in the middle or right side . Headaches or seeing spots . Feeling nauseated or throwing up . Swelling in face and hands  Zone 3: EMERGENCY  Seek immediate medical care if you have any of the  following:  . BP reading is greater than160 (top number) or greater than 110 (bottom number) . Severe headaches not improving with Tylenol . Serious difficulty catching your breath . Any worsening symptoms from Zone 2  Preterm Labor and Birth Information  The normal length of a pregnancy is 39-41 weeks. Preterm labor is when labor starts before 37 completed weeks of pregnancy. What are the risk factors for preterm labor? Preterm labor is more likely to occur in women who:  Have certain infections during pregnancy such as a bladder infection, sexually transmitted infection, or infection inside the uterus (chorioamnionitis).  Have a shorter-than-normal cervix.  Have gone into preterm  labor before.  Have had surgery on their cervix.  Are younger than age 69 or older than age 11.  Are African American.  Are pregnant with twins or multiple babies (multiple gestation).  Take street drugs or smoke while pregnant.  Do not gain enough weight while pregnant.  Became pregnant shortly after having been pregnant. What are the symptoms of preterm labor? Symptoms of preterm labor include:  Cramps similar to those that can happen during a menstrual period. The cramps may happen with diarrhea.  Pain in the abdomen or lower back.  Regular uterine contractions that may feel like tightening of the abdomen.  A feeling of increased pressure in the pelvis.  Increased watery or bloody mucus discharge from the vagina.  Water breaking (ruptured amniotic sac). Why is it important to recognize signs of preterm labor? It is important to recognize signs of preterm labor because babies who are born prematurely may not be fully developed. This can put them at an increased risk for:  Long-term (chronic) heart and lung problems.  Difficulty immediately after birth with regulating body systems, including blood sugar, body temperature, heart rate, and breathing rate.  Bleeding in the brain.  Cerebral palsy.  Learning difficulties.  Death. These risks are highest for babies who are born before 41 weeks of pregnancy. How is preterm labor treated? Treatment depends on the length of your pregnancy, your condition, and the health of your baby. It may involve: 1. Having a stitch (suture) placed in your cervix to prevent your cervix from opening too early (cerclage). 2. Taking or being given medicines, such as: ? Hormone medicines. These may be given early in pregnancy to help support the pregnancy. ? Medicine to stop contractions. ? Medicines to help mature the baby's lungs. These may be prescribed if the risk of delivery is high. ? Medicines to prevent your baby from developing  cerebral palsy. If the labor happens before 34 weeks of pregnancy, you may need to stay in the hospital. What should I do if I think I am in preterm labor? If you think that you are going into preterm labor, call your health care provider right away. How can I prevent preterm labor in future pregnancies? To increase your chance of having a full-term pregnancy:  Do not use any tobacco products, such as cigarettes, chewing tobacco, and e-cigarettes. If you need help quitting, ask your health care provider.  Do not use street drugs or medicines that have not been prescribed to you during your pregnancy.  Talk with your health care provider before taking any herbal supplements, even if you have been taking them regularly.  Make sure you gain a healthy amount of weight during your pregnancy.  Watch for infection. If you think that you might have an infection, get it checked right away.  Make sure to tell  your health care provider if you have gone into preterm labor before. This information is not intended to replace advice given to you by your health care provider. Make sure you discuss any questions you have with your health care provider. Document Revised: 11/09/2018 Document Reviewed: 12/09/2015 Elsevier Patient Education  [place].

## 2020-09-15 ENCOUNTER — Other Ambulatory Visit: Payer: Self-pay

## 2020-09-15 ENCOUNTER — Ambulatory Visit (INDEPENDENT_AMBULATORY_CARE_PROVIDER_SITE_OTHER): Payer: Medicaid Other | Admitting: Obstetrics & Gynecology

## 2020-09-15 VITALS — BP 124/81 | HR 76 | Wt 156.0 lb

## 2020-09-15 DIAGNOSIS — O0990 Supervision of high risk pregnancy, unspecified, unspecified trimester: Secondary | ICD-10-CM

## 2020-09-15 DIAGNOSIS — Z3A33 33 weeks gestation of pregnancy: Secondary | ICD-10-CM

## 2020-09-15 LAB — POCT URINALYSIS DIPSTICK OB
Blood, UA: NEGATIVE
Glucose, UA: NEGATIVE
Ketones, UA: NEGATIVE
Leukocytes, UA: NEGATIVE
Nitrite, UA: NEGATIVE
POC,PROTEIN,UA: NEGATIVE

## 2020-09-15 NOTE — Progress Notes (Signed)
HIGH-RISK PREGNANCY VISIT Patient name: Erin Dennis MRN [id number]  Date of birth: 1980/09/03 Chief Complaint:   Routine Prenatal Visit (W/I decreased FM and pressure)  History of Present Illness:   Erin Dennis is a 40 y.o. G48P0020 female at 70w6dwith an Estimated Date of Delivery: 10/28/20 being seen today for ongoing management of a high-risk pregnancy complicated by diabetes mellitus A1DM. decreased fetal movement Today she reports no complaints.  [facility] 08/12/2020 04/15/2020 12/06/2019  Decreased Interest 0 0 1  Down, Depressed, Hopeless 0 0 1  PHQ - 2 Score 0 0 2  Altered sleeping - 1 3  Tired, decreased energy - 1 2  Change in appetite - 1 1  Feeling bad or failure about yourself  - 0 0  Trouble concentrating - 0 0  Moving slowly or fidgety/restless - 0 0  Suicidal thoughts - 0 0  PHQ-9 Score - 3 8    Contractions: Irritability. Vag. Bleeding: None.  Movement: (!) Decreased. denies leaking of fluid.  Review of Systems:   Pertinent items are noted in HPI Denies abnormal vaginal discharge w/ itching/odor/irritation, headaches, visual changes, shortness of breath, chest pain, abdominal pain, severe nausea/vomiting, or problems with urination or bowel movements unless otherwise stated above. Pertinent History Reviewed:  Reviewed past medical,surgical, social, obstetrical and family history.  Reviewed problem list, medications and allergies. Physical Assessment:   Vitals:   09/15/20 1344  BP: 124/81  Pulse: 76  Weight: 156 lb (70.8 kg)  Body mass index is 29.48 kg/m.           Physical Examination:   General appearance: alert, well appearing, and in no distress  Mental status: alert, oriented to person, place, and time  Skin: warm & dry   Extremities: Edema: Mild pitting, slight indentation    Cardiovascular: normal heart rate noted  Respiratory: normal respiratory effort, no distress  Abdomen: gravid, soft, non-tender  Pelvic: Cervical exam  deferred         Fetal Status:     Movement: (!) Decreased   chronic issue   Reactive NST  Fetal Surveillance Testing today: reactive NST   Chaperone: ACelene Squibb   Results for orders placed or performed in visit on 09/15/20 (from the past 24 hour(s))  POC Urinalysis Dipstick OB   Collection Time: 09/15/20  1:53 PM  Result Value Ref Range   Color, UA     Clarity, UA     Glucose, UA Negative Negative   Bilirubin, UA     Ketones, UA neg    Spec Grav, UA     Blood, UA neg    pH, UA     POC,PROTEIN,UA Negative Negative, Trace, Small (1+), Moderate (2+), Large (3+), 4+   Urobilinogen, UA     Nitrite, UA neg    Leukocytes, UA Negative Negative   Appearance     Odor      Assessment & Plan:  High-risk pregnancy: G3P0020 at 372w6dith an Estimated Date of Delivery: 10/28/20   1) Chronic diminished maternal sensation of fetal movement with good testing, will begin weekly BPPs for assessment, stable  2) Carpal tunnel and plantar fasciitis, stable  Meds: No orders of the defined types were placed in this encounter.   Labs/procedures today: nst   Erin Dennis at 3326w6dtimated Date of Delivery: 10/28/20  NST being performed due to decreased fetal movement, chronically  Today the NST is Reactive  Fetal Monitoring:  Baseline: 135 bpm, Variability:  Good {> 6 bpm), Accelerations: Reactive and Decelerations: Absent   reactive  The accelerations are >15 bpm and more than 2 in 20 minutes  Final diagnosis:  Reactive NST  [name], MD     Treatment Plan:  Weekly BPP for chronic maternal inability to feel fetal movements, EFW 36-37 weeks  Carpal tunnel braces, plantar fasciitis  Reviewed: Term labor symptoms and general obstetric precautions including but not limited to vaginal bleeding, contractions, leaking of fluid and fetal movement were reviewed in detail with the patient.  All questions were answered. Has home bp cuff. Rx faxed to . Check bp weekly, let us know  if >140/90.   Follow-up: Return in about 1 week (around 09/22/2020) for BPP/sono, HROB.   No future appointments.  Orders Placed This Encounter  Procedures  . POC Urinalysis Dipstick OB   [name]  09/15/2020 3:02 PM

## 2020-09-17 ENCOUNTER — Encounter: Payer: Medicaid Other | Admitting: Obstetrics & Gynecology

## 2020-09-22 ENCOUNTER — Ambulatory Visit (INDEPENDENT_AMBULATORY_CARE_PROVIDER_SITE_OTHER): Payer: Medicaid Other | Admitting: Family Medicine

## 2020-09-22 ENCOUNTER — Other Ambulatory Visit: Payer: Self-pay

## 2020-09-22 VITALS — BP 119/84 | HR 77 | Wt 156.8 lb

## 2020-09-22 DIAGNOSIS — O0993 Supervision of high risk pregnancy, unspecified, third trimester: Secondary | ICD-10-CM

## 2020-09-22 DIAGNOSIS — Z3A34 34 weeks gestation of pregnancy: Secondary | ICD-10-CM

## 2020-09-22 DIAGNOSIS — Z1389 Encounter for screening for other disorder: Secondary | ICD-10-CM

## 2020-09-22 DIAGNOSIS — O24410 Gestational diabetes mellitus in pregnancy, diet controlled: Secondary | ICD-10-CM

## 2020-09-22 DIAGNOSIS — O09513 Supervision of elderly primigravida, third trimester: Secondary | ICD-10-CM

## 2020-09-22 LAB — POCT URINALYSIS DIPSTICK OB
Blood, UA: NEGATIVE
Glucose, UA: NEGATIVE
Ketones, UA: NEGATIVE
Leukocytes, UA: NEGATIVE
Nitrite, UA: NEGATIVE

## 2020-09-22 NOTE — Progress Notes (Signed)
° °  PRENATAL VISIT NOTE  Subjective:  Erin Dennis is a 40 y.o. G3P0020 at 55w6dbeing seen today for ongoing prenatal care.  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  Patient reports no complaints.  Contractions: Irritability. Vag. Bleeding: None.  Movement: Present. Denies leaking of fluid.   The following portions of the patient's history were reviewed and updated as appropriate: allergies, current medications, past family history, past medical history, past social history, past surgical history and problem list.   Objective:   Vitals:   09/22/20 1515  BP: 119/84  Pulse: 77  Weight: 156 lb 12.8 oz (71.1 kg)    Fetal Status: Fetal Heart Rate (bpm): 145 Fundal Height: 34 cm Movement: Present     General:  Alert, oriented and cooperative. Patient is in no acute distress.  Skin: Skin is warm and dry. No rash noted.   Cardiovascular: Normal heart rate noted  Respiratory: Normal respiratory effort, no problems with respiration noted  Abdomen: Soft, gravid, appropriate for gestational age.  Pain/Pressure: Present     Pelvic: Cervical exam deferred        Extremities: Normal range of motion.  Edema: Trace  Mental Status: Normal mood and affect. Normal behavior. Normal judgment and thought content.   NST: 135/mod/+accels not decels Toco quiet  Assessment and Plan:  Pregnancy: G3P0020 at 365w6d. [redacted] weeks gestation of pregnancy  2. Screening for genitourinary condition - POC Urinalysis Dipstick OB  3. Primigravida of advanced maternal age in third trimester  4. Diet controlled gestational diabetes mellitus (GDM) in third trimester BG is excellent, well organized log No need to start meds today Encouraged her hard work  5. Supervision of high  risk pregnancy in third trimester Reviewed NST which is reactive and very reassuring Patient is nervous about pregnancy Confirmed desire for BTS We discussed at length her prior SAB  She desired to talk about timing of delivery. Will defer to primary team but we discussed general recommendations of 40 wk IOL given diet control DM. Also might be a candidate for elective IOl at 39 wks.  Reviewed plan for 2x weekly NST vs weekly BPP if available appts.    Preterm labor symptoms and general obstetric precautions including but not limited to vaginal bleeding, contractions, leaking of fluid and fetal movement were reviewed in detail with the patient. Please refer to After Visit Summary for other counseling recommendations.   Return for Scheduled prenatal/NST.  Future Appointments  Date Time Provider [place]3/08/2020  3:10 PM EuFlorian BuffMD [facility]  10/02/2020 11:10 AM [facility] NURSE [facility]  10/06/2020  3:30 PM EuFlorian BuffMD [facility]  10/09/2020 11:10 AM [facility] NURSE [facility]  10/14/2020 11:00 AM [facility] [place] None  10/14/2020 11:50 AM BoRoma SchanzCNM [facility]  10/21/2020 11:15 AM [facility] [place] None  10/21/2020 12:10 PM OzJanyth PupaDO [facility]    KiCaren MacadamMD

## 2020-09-25 ENCOUNTER — Ambulatory Visit (INDEPENDENT_AMBULATORY_CARE_PROVIDER_SITE_OTHER): Payer: Medicaid Other | Admitting: *Deleted

## 2020-09-25 ENCOUNTER — Other Ambulatory Visit: Payer: Self-pay

## 2020-09-25 VITALS — BP 123/84 | HR 73 | Wt 154.4 lb

## 2020-09-25 DIAGNOSIS — O09513 Supervision of elderly primigravida, third trimester: Secondary | ICD-10-CM

## 2020-09-25 DIAGNOSIS — O24410 Gestational diabetes mellitus in pregnancy, diet controlled: Secondary | ICD-10-CM

## 2020-09-25 DIAGNOSIS — O0993 Supervision of high risk pregnancy, unspecified, third trimester: Secondary | ICD-10-CM

## 2020-09-25 DIAGNOSIS — Z302 Encounter for sterilization: Secondary | ICD-10-CM

## 2020-09-25 NOTE — Progress Notes (Addendum)
   NURSE VISIT- NST  SUBJECTIVE:  Erin Dennis is a 40 y.o. G3P0020 female at [redacted]w[redacted]d here for a NST for pregnancy complicated by [id number]  She reports active fetal movement, contractions: none, vaginal bleeding: none, membranes: intact.   OBJECTIVE:  BP 123/84   Pulse 73   Wt 154 lb 6.4 oz (70 kg)   LMP 01/22/2020   BMI 29.17 kg/m   Appears well, no apparent distress  No results found for this or any previous visit (from the past 24 hour(s)).  NST: FHR baseline 130 bpm, Variability: moderate, Accelerations:present, Decelerations:  Absent= Cat 1 /Reactive Toco: none   ASSESSMENT: G3P0020 at 331w2dith A1DM NST reactive  PLAN: EFM strip reviewed by KiDerrill MemoCNM   Recommendations: keep next appointment as scheduled    LaAlice Rieger2/25/2022 11:58 AM   Chart reviewed for nurse visit. Agree with plan of care.  ShMyrtis SerCNM 09/25/2020 12:46 PM

## 2020-09-28 ENCOUNTER — Encounter (HOSPITAL_COMMUNITY): Payer: Self-pay | Admitting: Family Medicine

## 2020-09-28 ENCOUNTER — Other Ambulatory Visit: Payer: Self-pay

## 2020-09-28 ENCOUNTER — Inpatient Hospital Stay (HOSPITAL_COMMUNITY)
Admission: AD | Admit: 2020-09-28 | Discharge: 2020-09-28 | Disposition: A | Discharge status: Home / Self Care | Payer: Medicaid Other | Attending: Family Medicine | Admitting: Family Medicine

## 2020-09-28 DIAGNOSIS — Z881 Allergy status to other antibiotic agents status: Secondary | ICD-10-CM | POA: Insufficient documentation

## 2020-09-28 DIAGNOSIS — O09523 Supervision of elderly multigravida, third trimester: Secondary | ICD-10-CM | POA: Diagnosis not present

## 2020-09-28 DIAGNOSIS — Z3A35 35 weeks gestation of pregnancy: Secondary | ICD-10-CM | POA: Diagnosis not present

## 2020-09-28 DIAGNOSIS — O4703 False labor before 37 completed weeks of gestation, third trimester: Secondary | ICD-10-CM | POA: Insufficient documentation

## 2020-09-28 DIAGNOSIS — Z888 Allergy status to other drugs, medicaments and biological substances status: Secondary | ICD-10-CM | POA: Insufficient documentation

## 2020-09-28 DIAGNOSIS — O0993 Supervision of high risk pregnancy, unspecified, third trimester: Secondary | ICD-10-CM

## 2020-09-28 DIAGNOSIS — Z3689 Encounter for other specified antenatal screening: Secondary | ICD-10-CM

## 2020-09-28 DIAGNOSIS — Z886 Allergy status to analgesic agent status: Secondary | ICD-10-CM | POA: Diagnosis not present

## 2020-09-28 DIAGNOSIS — O24410 Gestational diabetes mellitus in pregnancy, diet controlled: Secondary | ICD-10-CM

## 2020-09-28 DIAGNOSIS — O09513 Supervision of elderly primigravida, third trimester: Secondary | ICD-10-CM | POA: Diagnosis not present

## 2020-09-28 DIAGNOSIS — Z302 Encounter for sterilization: Secondary | ICD-10-CM | POA: Diagnosis not present

## 2020-09-28 DIAGNOSIS — O479 False labor, unspecified: Secondary | ICD-10-CM

## 2020-09-28 LAB — URINALYSIS, ROUTINE W REFLEX MICROSCOPIC
Bilirubin Urine: NEGATIVE
Glucose, UA: NEGATIVE mg/dL
Hgb urine dipstick: NEGATIVE
Ketones, ur: NEGATIVE mg/dL
Nitrite: NEGATIVE
Protein, ur: 30 mg/dL — AB
Specific Gravity, Urine: 1.023 (ref 1.005–1.030)
pH: 5 (ref 5.0–8.0)

## 2020-09-28 LAB — WET PREP, GENITAL
Sperm: NONE SEEN
Trich, Wet Prep: NONE SEEN
Yeast Wet Prep HPF POC: NONE SEEN

## 2020-09-28 MED ORDER — NIFEDIPINE 10 MG PO CAPS
10.0000 mg | ORAL_CAPSULE | Freq: Once | ORAL | Status: AC
  Administered 2020-09-28: 10 mg via ORAL

## 2020-09-28 MED ORDER — NIFEDIPINE 10 MG PO CAPS
10.0000 mg | ORAL_CAPSULE | Freq: Once | ORAL | Status: AC
  Administered 2020-09-28: 10 mg via ORAL
  Filled 2020-09-28: qty 1

## 2020-09-28 MED ORDER — LACTATED RINGERS IV BOLUS
1000.0000 mL | Freq: Once | INTRAVENOUS | Status: AC
  Administered 2020-09-28: 1000 mL via INTRAVENOUS

## 2020-09-28 NOTE — MAU Note (Signed)
Presents with c/o ctxs since 0530 that are 1-2 minutes apart.  Denies VB or LOF.  Endorses +FM.

## 2020-09-28 NOTE — Discharge Instructions (Signed)
Preterm Labor Pregnancy normally lasts 39-41 weeks. Preterm labor is when labor starts before you have been pregnant for 37 weeks. Babies who are born too early may have problems with blood sugar, body temperature, heart, and breathing. These problems may be very serious in babies who are born before 19 weeks of pregnancy. What are the causes? The cause of this condition is not known. What increases the risk? You are more likely to have preterm labor if:  You have medical problems, now or in the past.  You have problems now or in your past pregnancies.  You have lifestyle problems. Medical history  You have problems of the womb (uterus).  You have an infection, including infections you get from sex.  You have problems that do not go away, such as: ? Blood clots. ? High blood pressure. ? High blood sugar.  You have low body weight or too much body weight. Present and past pregnancies  You have had preterm labor before.  You are pregnant with two babies or more.  You have a condition in which the placenta covers your cervix.  You waited less than 6 months between giving birth and becoming pregnant again.  Your unborn baby has some problems.  You have bleeding from your vagina.  You became pregnant by a method called IVF. Lifestyle  You smoke.  You drink alcohol.  You use drugs.  You have stress.  You have abuse in your home.  You come in contact with chemicals that harm the body (pollutants). Other factors  You are younger than 17 years or older than 35 years. What are the signs or symptoms? Symptoms of this condition include:  Cramps. The cramps may feel like cramps from a period.  You may have watery poop (diarrhea).  Pain in the belly (abdomen).  Pain in the lower back.  Regular contractions. It may feel like your belly is getting tighter.  Pressure in the lower belly.  More fluid leaking from the vagina. The fluid may be watery or  bloody.  Water breaking. How is this treated? Treatment for this condition depends on your health, the health of your baby, and how old your pregnancy is. It may include:  Taking medicines, such as: ? Hormone medicines. ? Medicines to stop contractions. ? Medicines to help mature the baby's lungs. ? Medicines to prevent your baby from getting cerebral palsy.  Bed rest. If the labor happens before 34 weeks of pregnancy, you may need to stay in the hospital.  Delivering the baby. Follow these instructions at home:  Do not use any products that contain nicotine or tobacco, such as cigarettes, e-cigarettes, and chewing tobacco. If you need help quitting, ask your doctor.  Do not drink alcohol.  Take over-the-counter and prescription medicines only as told by your doctor.  Rest as told by your doctor.  Return to your activities as told by your doctor. Ask your doctor what activities are safe for you.  Keep all follow-up visits as told by your doctor. This is important.   How is this prevented? To have a healthy pregnancy:  Do not use street drugs.  Do not use any medicines unless you ask your doctor if they are safe for you.  Talk with your doctor before taking any herbal supplements.  Make sure you gain enough weight.  Watch for infection. If you think you might have an infection, get it checked right away. Symptoms of infection may include: ? Fever. ? Vaginal discharge. ?  Pain or burning when you pee. ? Needing to pee urgently. ? Needing to pee often. ? Peeing small amounts often. ? Blood in your pee. ? Pee that smells bad or unusual.  Tell your doctor if you have gone into preterm labor before. Contact a doctor if:  You think you are going into preterm labor.  You have symptoms of preterm labor.  You have symptoms of infection. Get help right away if:  You are having painful contractions every 5 minutes or less.  Your water breaks. Summary  Preterm labor  is labor that starts before you reach 37 weeks of pregnancy.  Your baby may have problems if delivered early.  The cause of preterm labor is not known. Having problems of the womb (uterus), an infection, or bleeding during pregnancy increases the risk.  Contact a doctor if you have signs or symptoms of preterm labor. This information is not intended to replace advice given to you by your health care provider. Make sure you discuss any questions you have with your health care provider. Document Revised: 08/20/2019 Document Reviewed: 08/20/2019 Elsevier Patient Education  2021 [employer].

## 2020-09-28 NOTE — Progress Notes (Signed)
Pt reports little improvement after first dose of procardia.

## 2020-09-28 NOTE — Progress Notes (Signed)
Pt reports improvement after second dose of procardia.

## 2020-09-28 NOTE — MAU Note (Signed)
Pt is G3P0 at 35.5 weeks c/o contractions that started at 0500 today.  Pt reports being GDM diet controlled, no other complications.

## 2020-09-28 NOTE — MAU Provider Note (Addendum)
History     CSN: [id number]  Arrival date and time: 09/28/20 2297   Event Date/Time   First Provider Initiated Contact with Patient 09/28/20 [phone]      Chief Complaint  Patient presents with  . Contractions   [name] is a 40 y.o. year old G65P0020 female at 59w5dweeks gestation who presents to MAU reporting contractions every 2 minutes since 0530. She reports some increased vaginal discharge, but denies any irritation. She last had SI "over a month ago." She has a significant pregnancy history for a SAB @ 6 wks in 2021. She also reports having a h/o a "tubal pregnancy while having an IUD that resolved on its own." [facility]. She has weekly NSTs "because he is stubborn and has whole days with no movement." Per her OB problem list she is A1GDM; likely the reason for weekly NSTs.   OB History    Gravida  3   Para      Term      Preterm      AB  2   Living        SAB  1   IAB      Ectopic  1   Multiple      Live Births              Past Medical History:  Diagnosis Date  . Anxiety   . Darier disease   . Depression     Past Surgical History:  Procedure Laterality Date  . APPENDECTOMY    . CHOLECYSTECTOMY    . DILATION AND CURETTAGE OF UTERUS     for menorrhagia  . DILATION AND CURETTAGE OF UTERUS N/A 10/07/2019   Procedure: SUCTION DILATATION AND CURETTAGE;  Surgeon: EFlorian Buff MD;  Location: [facility];  Service: Gynecology;  Laterality: N/A;  Dr. Request Time 9:30am  . OVARIAN CYST SURGERY     x3  . TONSILLECTOMY    . TONSILLECTOMY  2007  . WISDOM TOOTH EXTRACTION      Family History  Problem Relation Age of Onset  . Diabetes Mother   . Hypertension Mother   . Diabetes Father   . Stroke Father   . Cancer Maternal Grandmother        ovarian    Social History   Tobacco Use  . Smoking status: Never Smoker  . Smokeless tobacco: Never Used  Vaping Use  . Vaping Use: Never used  Substance Use Topics  .  Alcohol use: Not Currently  . Drug use: Not Currently    Allergies:  Allergies  Allergen Reactions  . Acetaminophen Anaphylaxis and Rash  . Sulfamethoxazole-Trimethoprim Other (See Comments)    Blisters  . Tape Rash    Medications Prior to Admission  Medication Sig Dispense Refill Last Dose  . Accu-Chek Softclix Lancets lancets Use as instructed to check blood sugar 4 times daily 100 each 12   . Blood Glucose Monitoring Suppl (ACCU-CHEK GUIDE ME) w/Device KIT 1 each by Does not apply route 4 (four) times daily. 1 kit 0   . Blood Pressure Monitor MISC For regular home bp monitoring during pregnancy 1 each 0   . glucose blood (ACCU-CHEK GUIDE) test strip Use as instructed to check blood sugar 4 times daily 50 each 12   . guaiFENesin (MUCINEX PO) Take by mouth.     . Prenatal Vit-Fe Fumarate-FA (MULTIVITAMIN-PRENATAL) 27-0.8 MG TABS tablet Take 1 tablet by mouth daily at 12  noon. (Patient not taking: Reported on 09/25/2020)       Review of Systems  Constitutional: Negative.   HENT: Negative.   Eyes: Negative.   Respiratory: Negative.   Cardiovascular: Negative.   Gastrointestinal: Negative.   Endocrine: Negative.   Genitourinary: Positive for pelvic pain (contractions every 2 mins since 0530) and vaginal discharge (increased).  Musculoskeletal: Negative.   Skin: Negative.   Allergic/Immunologic: Negative.   Neurological: Negative.   Hematological: Negative.   Psychiatric/Behavioral: Negative.    Physical Exam   Blood pressure 129/75, pulse 67, resp. rate 18, height _0  (1.549 m), weight 69.9 kg, last menstrual period 01/22/2020, SpO2 100 %.  Physical Exam Vitals and nursing note reviewed. Exam conducted with a chaperone present.  Constitutional:      Appearance: Normal appearance. She is normal weight.  Cardiovascular:     Rate and Rhythm: Normal rate.  Genitourinary:    Comments: Uterus: gravid, S=D, SE: cervix is smooth, pink, no lesions, copious amt of watery, white  vaginal d/c in vaginal vault; white adherent d/c to vaginal walls -- WP, GC/CT done, no CMT or friability, no adnexal tenderness    Skin:    General: Skin is warm and dry.     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  Neurological:     Mental Status: She is alert and oriented to person, place, and time.  Psychiatric:        Mood and Affect: Mood normal.        Behavior: Behavior normal.        Thought Content: Thought content normal.        Judgment: Judgment normal.     MAU Course  Procedures  MDM Wet prep GC/CT -- Results pending  Fern Slide  No results found for this or any previous visit (from the past 24 hour(s)).   Report given to and care assumed by Dr. Nehemiah Settle @ [address] CNM 09/28/2020, 8:10 AM   No cervical change, but feeling contractions stronger. Give 1L IVF bolus. Procardia IR 41m x2 doses with reduction of contractions.  NST Baseline 130s, multiple accelerations, moderate variability, no decels.  Assessment and Plan     ICD-10-CM   1. [redacted] weeks gestation of pregnancy  Z3A.35   2. Diet controlled gestational diabetes mellitus (GDM) in third trimester  O24.410   3. Request for sterilization  Z30.2   4. Primigravida of advanced maternal age in third trimester  OO35.513  5. Supervision of high risk pregnancy in third trimester  O09.93   6. NST (non-stress test) reactive  Z36.89   7. False labor  O47.9    Discharge to home. Return precautions given.   JTruett Mainland DO

## 2020-09-29 ENCOUNTER — Encounter: Payer: Self-pay | Admitting: *Deleted

## 2020-09-29 ENCOUNTER — Ambulatory Visit (INDEPENDENT_AMBULATORY_CARE_PROVIDER_SITE_OTHER): Payer: Medicaid Other | Admitting: Obstetrics & Gynecology

## 2020-09-29 ENCOUNTER — Encounter: Payer: Self-pay | Admitting: Obstetrics & Gynecology

## 2020-09-29 VITALS — BP 126/85 | HR 85 | Wt 157.0 lb

## 2020-09-29 DIAGNOSIS — O368130 Decreased fetal movements, third trimester, not applicable or unspecified: Secondary | ICD-10-CM | POA: Diagnosis not present

## 2020-09-29 DIAGNOSIS — O0993 Supervision of high risk pregnancy, unspecified, third trimester: Secondary | ICD-10-CM

## 2020-09-29 DIAGNOSIS — O24410 Gestational diabetes mellitus in pregnancy, diet controlled: Secondary | ICD-10-CM | POA: Diagnosis not present

## 2020-09-29 LAB — GC/CHLAMYDIA PROBE AMP (~~LOC~~) NOT AT ARMC
Chlamydia: NEGATIVE
Comment: NEGATIVE
Comment: NORMAL
Neisseria Gonorrhea: NEGATIVE

## 2020-09-29 NOTE — Progress Notes (Signed)
HIGH-RISK PREGNANCY VISIT Patient name: Erin Dennis MRN [id number]  Date of birth: 1981-03-09 Chief Complaint:   Routine Prenatal Visit (NST)  History of Present Illness:   Erin Dennis is a 40 y.o. G61P0020 female at 44w6dwith an Estimated Date of Delivery: 10/28/20 being seen today for ongoing management of a high-risk pregnancy complicated by advanced maternal age and diabetes mellitus A1DM.  Today she reports no complaints.  Depression screen [facility] 08/12/2020 04/15/2020 12/06/2019  Decreased Interest 0 0 1  Down, Depressed, Hopeless 0 0 1  PHQ - 2 Score 0 0 2  Altered sleeping - 1 3  Tired, decreased energy - 1 2  Change in appetite - 1 1  Feeling bad or failure about yourself  - 0 0  Trouble concentrating - 0 0  Moving slowly or fidgety/restless - 0 0  Suicidal thoughts - 0 0  PHQ-9 Score - 3 8    Contractions: Irregular. Vag. Bleeding: None.  Movement: Present. denies leaking of fluid.  Review of Systems:   Pertinent items are noted in HPI Denies abnormal vaginal discharge w/ itching/odor/irritation, headaches, visual changes, shortness of breath, chest pain, abdominal pain, severe nausea/vomiting, or problems with urination or bowel movements unless otherwise stated above. Pertinent History Reviewed:  Reviewed past medical,surgical, social, obstetrical and family history.  Reviewed problem list, medications and allergies. Physical Assessment:   Vitals:   09/29/20 1507  BP: 126/85  Pulse: 85  Weight: 157 lb (71.2 kg)  Body mass index is 29.66 kg/m.           Physical Examination:   General appearance: alert, well appearing, and in no distress  Mental status: alert, oriented to person, place, and time  Skin: warm & dry   Extremities: Edema: Trace    Cardiovascular: normal heart rate noted  Respiratory: normal respiratory effort, no distress  Abdomen: gravid, soft, non-tender  Pelvic: Cervical exam performed         Fetal Status:     Movement: Present     Fetal Surveillance Testing today: Reactive NST  Erin NLaspisais at 37w6dstimated Date of Delivery: 10/28/20  NST being performed due to chronically decreased fetal movement  Today the NST is Reactive  Fetal Monitoring:  Baseline: 140s bpm, Variability: Good {> 6 bpm), Accelerations: Reactive and Decelerations: Absent   reactive  The accelerations are >15 bpm and more than 2 in 20 minutes  Final diagnosis:  Reactive NST  LuFlorian BuffMD      Chaperone: AmCelene Squibb  No results found for this or any previous visit (from the past 24 hour(s)).  Assessment & Plan:  High-risk pregnancy: G3P0020 at 3548w6dth an Estimated Date of Delivery: 10/28/20   1) A1DM, CBG are all good,   2) Chronic decreased fetal movement, reassuring surveillance, continue weekly, stable  Meds: No orders of the defined types were placed in this encounter.   Labs/procedures today: NST  Treatment Plan:  Weekly NST IOL 39 weeks  Reviewed: Preterm labor symptoms and general obstetric precautions including but not limited to vaginal bleeding, contractions, leaking of fluid and fetal movement were reviewed in detail with the patient.  All questions were answered. Has home bp cuff. Rx faxed to . Check bp weekly, let us [place] if >140/90.   Follow-up: Return in about 1 week (around 10/06/2020) for NST, HROB.   Future Appointments  Date Time Provider DepSpiceland/10/2020 11:10 AM [facility] NURSE [facility]  10/06/2020  3:30 PM [name], MD [facility]  10/09/2020 11:10 AM [facility]URSE [facility]  10/14/2020 11:00 AM [facility] [place] [facility] None  10/14/2020 11:50 AM [name], CNM [facility]  10/21/2020 11:15 AM [place] [facility] [place] [facility] None  10/21/2020 12:10 PM [name], DO [facility]    No orders of the defined types were placed in this encounter.  [name]  09/29/2020 4:01 PM

## 2020-10-01 ENCOUNTER — Other Ambulatory Visit: Payer: Self-pay | Admitting: Women's Health

## 2020-10-01 ENCOUNTER — Telehealth: Payer: Self-pay | Admitting: *Deleted

## 2020-10-01 MED ORDER — METRONIDAZOLE 500 MG PO TABS: 500.0000 mg | ORAL_TABLET | Freq: Two times a day (BID) | ORAL | 0 refills | Status: DC

## 2020-10-01 NOTE — Telephone Encounter (Signed)
Mychart message received stating patient was having a "watery discharge".  Pt states the consistency is different from when she was seen in MAU on 2/28 but does not think her water is broken.  States she was told at a visit earlier in her pregnancy when she had the same symptoms that it was cervical mucous.  Informed wet mount did show clue cells which indicated BV but treatment was not sent in.  Patient states she would like to be treated for this.  Informed I would send a message to provider and have medication sent in.

## 2020-10-02 ENCOUNTER — Other Ambulatory Visit: Payer: Self-pay

## 2020-10-02 ENCOUNTER — Ambulatory Visit (INDEPENDENT_AMBULATORY_CARE_PROVIDER_SITE_OTHER): Payer: Medicaid Other | Admitting: *Deleted

## 2020-10-02 ENCOUNTER — Encounter: Payer: Self-pay | Admitting: *Deleted

## 2020-10-02 DIAGNOSIS — O09513 Supervision of elderly primigravida, third trimester: Secondary | ICD-10-CM

## 2020-10-02 DIAGNOSIS — Z302 Encounter for sterilization: Secondary | ICD-10-CM

## 2020-10-02 DIAGNOSIS — O24410 Gestational diabetes mellitus in pregnancy, diet controlled: Secondary | ICD-10-CM | POA: Diagnosis not present

## 2020-10-02 DIAGNOSIS — O0993 Supervision of high risk pregnancy, unspecified, third trimester: Secondary | ICD-10-CM

## 2020-10-02 NOTE — Progress Notes (Addendum)
   NURSE VISIT- NST  SUBJECTIVE:  Erin Dennis is a 40 y.o. G3P0020 female at [redacted]w[redacted]d here for a NST for pregnancy complicated by [id number]  She reports active fetal movement, contractions: none, vaginal bleeding: none, membranes: intact.   OBJECTIVE:  LMP 01/22/2020   Appears well, no apparent distress  No results found for this or any previous visit (from the past 24 hour(s)).  NST: FHR baseline 130 bpm, Variability: moderate, Accelerations:present, Decelerations:  Absent= Cat 1/Reactive Toco: none   ASSESSMENT: G3P0020 at 3110w2dith A1DM NST reactive  PLAN: EFM strip reviewed by Dr. EuElonda Husky Recommendations: keep next appointment as scheduled    LaAlice Rieger3/10/2020 12:52 PM  Attestation of Attending Supervision of Nursing Visit Encounter: Evaluation and management procedures were performed by the nursing staff under my supervision and collaboration.  I have reviewed the nurse's note and chart, and I agree with the management and plan.  LuJacelyn GripD Attending Physician for the [facility] 10/05/2020 9:36 AM

## 2020-10-05 ENCOUNTER — Other Ambulatory Visit: Payer: Self-pay

## 2020-10-05 ENCOUNTER — Encounter (HOSPITAL_COMMUNITY): Payer: Self-pay | Admitting: Obstetrics & Gynecology

## 2020-10-05 ENCOUNTER — Inpatient Hospital Stay (HOSPITAL_COMMUNITY)
Admission: AD | Admit: 2020-10-05 | Discharge: 2020-10-05 | Disposition: A | Discharge status: Home / Self Care | Payer: Medicaid Other | Attending: Obstetrics & Gynecology | Admitting: Obstetrics & Gynecology

## 2020-10-05 DIAGNOSIS — O0993 Supervision of high risk pregnancy, unspecified, third trimester: Secondary | ICD-10-CM

## 2020-10-05 DIAGNOSIS — Z3A36 36 weeks gestation of pregnancy: Secondary | ICD-10-CM

## 2020-10-05 DIAGNOSIS — R030 Elevated blood-pressure reading, without diagnosis of hypertension: Secondary | ICD-10-CM | POA: Insufficient documentation

## 2020-10-05 DIAGNOSIS — Z3689 Encounter for other specified antenatal screening: Secondary | ICD-10-CM

## 2020-10-05 DIAGNOSIS — Z302 Encounter for sterilization: Secondary | ICD-10-CM

## 2020-10-05 DIAGNOSIS — O26893 Other specified pregnancy related conditions, third trimester: Secondary | ICD-10-CM | POA: Diagnosis present

## 2020-10-05 DIAGNOSIS — Z0371 Encounter for suspected problem with amniotic cavity and membrane ruled out: Secondary | ICD-10-CM | POA: Diagnosis not present

## 2020-10-05 DIAGNOSIS — O24410 Gestational diabetes mellitus in pregnancy, diet controlled: Secondary | ICD-10-CM

## 2020-10-05 DIAGNOSIS — O479 False labor, unspecified: Secondary | ICD-10-CM

## 2020-10-05 DIAGNOSIS — O09513 Supervision of elderly primigravida, third trimester: Secondary | ICD-10-CM

## 2020-10-05 HISTORY — DX: Unspecified ovarian cyst, unspecified side: N83.209

## 2020-10-05 HISTORY — DX: Gestational diabetes mellitus in pregnancy, unspecified control: O24.419

## 2020-10-05 HISTORY — DX: Unspecified infectious disease: B99.9

## 2020-10-05 NOTE — MAU Note (Signed)
1st baby, was 3 last Friday, call from front desk- wanting to push.  Started contracting about 45 min ago, coming every 1-2 min.  Started leaking about 13mn ago.  No bleeding.

## 2020-10-05 NOTE — Discharge Instructions (Signed)

## 2020-10-05 NOTE — MAU Provider Note (Signed)
Event Date/Time   First Provider Initiated Contact with Patient 10/05/20 1739     S: [name] is a 40 y.o. G3P0020 at [redacted]w[redacted]d who presents to MAU today complaining of leaking of fluid since about 20 minutes before she arrived to MAU. She denies vaginal bleeding. She endorses contractions. She reports normal fetal movement.    Patient notified MAU registration of a strong urge to push shortly after arrival. She was immediately assisted to exam room.  O: BP 117/81   Pulse 72   Temp 98.6 F (37 C)   LMP 01/22/2020   SpO2 99%    Patient Vitals for the past 24 hrs:  BP Temp Pulse SpO2  10/05/20 1831 117/81 -- 72 --  10/05/20 1816 127/72 -- 70 --  10/05/20 1801 122/78 -- 68 --  10/05/20 1746 (!) 151/84 -- 66 --  10/05/20 1731 (!) 126/93 -- 67 --  10/05/20 1726 133/87 -- 73 --  10/05/20 1705 -- -- -- 99 %  10/05/20 1700 (!) 144/83 98.6 F (37 C) 69 98 %    GENERAL: Well-developed, well-nourished female in no acute distress.  HEAD: Normocephalic, atraumatic.  CHEST: Normal effort of breathing, regular heart rate ABDOMEN: Soft, nontender, gravid PELVIC: Normal external female genitalia. Vagina is pink and rugated. Cervix with normal contour, no lesions. Normal discharge.  Negative pooling.   Cervical exam:  Dilation: 3 Effacement (%): 50 Cervical Position: Posterior Station: Ballotable Exam by:: Traci   Fetal Monitoring: Baseline: 130 Variability: Mod Accelerations: 15 x 15 Decelerations: None Contractions: Occasional, UI noted  A: SIUP at 357w5dIntact amniotic sac Negative pooling, negative fern Cervix unchanged from most recent exam in office last week No cervical change in two hours of monitoring in MAU Initial elevated blood pressures which coincided with patient's reported urge to push, then normotensive No hx HTN, no severe range or severe symptoms   P: Discharge home in stable condition   F/U: Next appointment with CWMcLains tomorrow  10/06/2020  WeDarlina RumpfCNM 10/05/2020 8:35 PM

## 2020-10-06 ENCOUNTER — Ambulatory Visit (INDEPENDENT_AMBULATORY_CARE_PROVIDER_SITE_OTHER): Payer: Medicaid Other | Admitting: Obstetrics & Gynecology

## 2020-10-06 ENCOUNTER — Encounter: Payer: Self-pay | Admitting: Obstetrics & Gynecology

## 2020-10-06 VITALS — BP 128/84 | HR 80 | Wt 156.5 lb

## 2020-10-06 DIAGNOSIS — O0993 Supervision of high risk pregnancy, unspecified, third trimester: Secondary | ICD-10-CM

## 2020-10-06 DIAGNOSIS — O368130 Decreased fetal movements, third trimester, not applicable or unspecified: Secondary | ICD-10-CM | POA: Diagnosis not present

## 2020-10-06 DIAGNOSIS — O24410 Gestational diabetes mellitus in pregnancy, diet controlled: Secondary | ICD-10-CM

## 2020-10-06 DIAGNOSIS — Z1389 Encounter for screening for other disorder: Secondary | ICD-10-CM

## 2020-10-06 DIAGNOSIS — Z3A36 36 weeks gestation of pregnancy: Secondary | ICD-10-CM

## 2020-10-06 LAB — POCT URINALYSIS DIPSTICK OB
Blood, UA: NEGATIVE
Glucose, UA: NEGATIVE
Leukocytes, UA: NEGATIVE
Nitrite, UA: NEGATIVE

## 2020-10-06 NOTE — Progress Notes (Signed)
HIGH-RISK PREGNANCY VISIT Patient name: Erin Dennis MRN [id number]  Date of birth: January 01, 1981 Chief Complaint:   Routine Prenatal Visit, High Risk Gestation, and Non-stress Test  History of Present Illness:   Erin Dennis is a 40 y.o. G31P0020 female at 105w6dwith an Estimated Date of Delivery: 10/28/20 being seen today for ongoing management of a high-risk pregnancy complicated by advanced maternal age and diabetes mellitus A1DM.  Today she reports backache, contractions since forever and pelvic pressure.  [facility] 08/12/2020 04/15/2020 12/06/2019  Decreased Interest 0 0 1  Down, Depressed, Hopeless 0 0 1  PHQ - 2 Score 0 0 2  Altered sleeping - 1 3  Tired, decreased energy - 1 2  Change in appetite - 1 1  Feeling bad or failure about yourself  - 0 0  Trouble concentrating - 0 0  Moving slowly or fidgety/restless - 0 0  Suicidal thoughts - 0 0  PHQ-9 Score - 3 8    Contractions: Irregular. Vag. Bleeding: None.  Movement: Present. denies leaking of fluid. Had eval at MAU yesterday which was negative Review of Systems:   Pertinent items are noted in HPI Denies abnormal vaginal discharge w/ itching/odor/irritation, headaches, visual changes, shortness of breath, chest pain, abdominal pain, severe nausea/vomiting, or problems with urination or bowel movements unless otherwise stated above. Pertinent History Reviewed:  Reviewed past medical,surgical, social, obstetrical and family history.  Reviewed problem list, medications and allergies. Physical Assessment:   Vitals:   10/06/20 1545  BP: 128/84  Pulse: 80  Weight: 156 lb 8 oz (71 kg)  Body mass index is 29.57 kg/m.           Physical Examination:   General appearance: alert, well appearing, and in no distress  Mental status: alert, oriented to person, place, and time  Skin: warm & dry   Extremities: Edema: Trace    Cardiovascular: normal heart rate noted  Respiratory: normal respiratory effort, no  distress  Abdomen: gravid, soft, non-tender  Pelvic: Cervical exam performed         Fetal Status:     Movement: Present    Fetal Surveillance Testing today: Reactive NST  Erin Dennis at 360w6dstimated Date of Delivery: 10/28/20  NST being performed due to Chronic ddecreased fetal movement  Today the NST is Reactive  Fetal Monitoring:  Baseline: 135 bpm, Variability: Good {> 6 bpm), Accelerations: Reactive and Decelerations: Absent   reactive  The accelerations are >15 bpm and more than 2 in 20 minutes  Final diagnosis:  Reactive NST  LuFlorian BuffMD      Chaperone: Erin Dennis  Results for orders placed or performed in visit on 10/06/20 (from the past 24 hour(s))  POC Urinalysis Dipstick OB   Collection Time: 10/06/20  3:48 PM  Result Value Ref Range   Color, UA     Clarity, UA     Glucose, UA Negative Negative   Bilirubin, UA     Ketones, UA large    Spec Grav, UA     Blood, UA neg    pH, UA     POC,PROTEIN,UA Trace Negative, Trace, Small (1+), Moderate (2+), Large (3+), 4+   Urobilinogen, UA     Nitrite, UA neg    Leukocytes, UA Negative Negative   Appearance     Odor      Assessment & Plan:  High-risk pregnancy: G3P0020 at 3678w6dth an Estimated Date of Delivery: 10/28/20  1) Class A1 DM, stable  2) Chronic decreased fetal movement, with reassuring fetal surveillance, recommend IOL 39 weeks due to this , stable  Meds: No orders of the defined types were placed in this encounter.   Labs/procedures today: NST  Treatment Plan:  Sonogram next week for EFW, BPP  Reviewed: Term labor symptoms and general obstetric precautions including but not limited to vaginal bleeding, contractions, leaking of fluid and fetal movement were reviewed in detail with the patient.  All questions were answered. Has home bp cuff. Rx faxed to . Check bp weekly, let us know if >140/90.   Follow-up: No follow-ups on file.   Future Appointments  Date Time Provider  [place]  10/09/2020 11:10 AM [facility] NURSE [facility]  10/14/2020 11:00 AM [facility] [place] [facility] None  10/14/2020 11:50 AM Erin Dennis, CNM [facility]  10/21/2020 11:15 AM [place] - [facility] [place] [facility] None  10/21/2020 12:10 PM Erin Pupa, DO [facility]    Orders Placed This Encounter  Procedures   Culture, beta strep (group b only)   POC Urinalysis Dipstick OB   Mertie Clause Raygen Dahm  10/06/2020 4:24 PM

## 2020-10-09 ENCOUNTER — Other Ambulatory Visit: Payer: Self-pay

## 2020-10-09 ENCOUNTER — Ambulatory Visit (INDEPENDENT_AMBULATORY_CARE_PROVIDER_SITE_OTHER): Payer: Medicaid Other | Admitting: *Deleted

## 2020-10-09 VITALS — BP 120/85 | HR 86 | Wt 156.0 lb

## 2020-10-09 DIAGNOSIS — O0993 Supervision of high risk pregnancy, unspecified, third trimester: Secondary | ICD-10-CM | POA: Diagnosis not present

## 2020-10-09 DIAGNOSIS — Z3A37 37 weeks gestation of pregnancy: Secondary | ICD-10-CM

## 2020-10-09 DIAGNOSIS — O24410 Gestational diabetes mellitus in pregnancy, diet controlled: Secondary | ICD-10-CM | POA: Diagnosis not present

## 2020-10-09 DIAGNOSIS — O09513 Supervision of elderly primigravida, third trimester: Secondary | ICD-10-CM

## 2020-10-09 DIAGNOSIS — Z302 Encounter for sterilization: Secondary | ICD-10-CM

## 2020-10-09 LAB — POCT URINALYSIS DIPSTICK OB
Blood, UA: NEGATIVE
Glucose, UA: NEGATIVE
Ketones, UA: NEGATIVE
Nitrite, UA: NEGATIVE
POC,PROTEIN,UA: NEGATIVE

## 2020-10-09 NOTE — Progress Notes (Signed)
   NURSE VISIT- NST  SUBJECTIVE:  Erin Dennis is a 40 y.o. G21P0020 female at [redacted]w[redacted]d here for a NST for pregnancy complicated by [id number]  She reports active fetal movement, contractions: none, vaginal bleeding: none, membranes: intact.   OBJECTIVE:  BP 120/85   Pulse 86   Wt 156 lb (70.8 kg)   LMP 01/22/2020   BMI 29.48 kg/m   Appears well, no apparent distress  Results for orders placed or performed in visit on 10/09/20 (from the past 24 hour(s))  POC Urinalysis Dipstick OB   Collection Time: 10/09/20 11:28 AM  Result Value Ref Range   Color, UA     Clarity, UA     Glucose, UA Negative Negative   Bilirubin, UA     Ketones, UA neg    Spec Grav, UA     Blood, UA neg    pH, UA     POC,PROTEIN,UA Negative Negative, Trace, Small (1+), Moderate (2+), Large (3+), 4+   Urobilinogen, UA     Nitrite, UA neg    Leukocytes, UA Trace (A) Negative   Appearance     Odor      NST: FHR baseline 140 bpm, Variability: moderate, Accelerations:present, Decelerations:  Absent= Cat 1/Reactive Toco: none   ASSESSMENT: G3P0020 at 316w2dith A1DM NST reactive  PLAN: EFM strip reviewed by Dr. EuElonda Husky Recommendations: keep next appointment as scheduled    LaAlice Rieger3/06/2021 12:13 PM

## 2020-10-10 LAB — CULTURE, BETA STREP (GROUP B ONLY): Strep Gp B Culture: NEGATIVE

## 2020-10-13 ENCOUNTER — Other Ambulatory Visit: Payer: Self-pay | Admitting: Obstetrics & Gynecology

## 2020-10-13 ENCOUNTER — Ambulatory Visit: Payer: Medicaid Other | Admitting: Obstetrics & Gynecology

## 2020-10-13 ENCOUNTER — Other Ambulatory Visit: Payer: Self-pay

## 2020-10-13 VITALS — BP 125/82 | HR 80 | Wt 154.6 lb

## 2020-10-13 DIAGNOSIS — O24410 Gestational diabetes mellitus in pregnancy, diet controlled: Secondary | ICD-10-CM

## 2020-10-13 DIAGNOSIS — O471 False labor at or after 37 completed weeks of gestation: Secondary | ICD-10-CM

## 2020-10-13 DIAGNOSIS — N898 Other specified noninflammatory disorders of vagina: Secondary | ICD-10-CM

## 2020-10-13 NOTE — Progress Notes (Signed)
Pt presented today for rule out rupture.  Notes that this afternoon felt like she was leaking and then notes contractions every 86mn or so that started soon after.  Notes some movement, but has struggled with decreased fetal movement- [place] for tomorrow per patient.  No vaginal bleeding.  Pt has regular scheduled appt tomorrow- just wanted to confirm if she was ruptured.  O; BP 125/82   Pulse 80   Wt 154 lb 9.6 oz (70.1 kg)   LMP 01/22/2020   BMI 29.21 kg/m     Physical Assessment:   Vitals:   10/13/20 1425  BP: 125/82  Pulse: 80  Weight: 154 lb 9.6 oz (70.1 kg)  Body mass index is 29.21 kg/m.        Physical Examination:  Gen: NAD Abd: gravid, non-tender SSE: White discharge noted, does not appear grossly ruptured SVE: 2/thick/ballotable  Labs: fern negative  Fetal Status: Fetal Heart Rate (bpm): 135   Movement: Present Presentation: Vertex  Chaperone: Angel Neas    No results found for this or any previous visit (from the past 24 hour(s)).   Assessment & Plan:  [id number] 387w6dith an Estimated Date of Delivery: 10/28/20   Negative for rupture F/U as scheduled tomorrow    Reviewed: Term labor symptoms and general obstetric precautions including but not limited to vaginal bleeding, contractions, leaking of fluid and fetal movement were reviewed in detail with the patient.  All questions were answered. Pt has home bp cuff. Check bp weekly, let usKoreanow if >140/90.   No orders of the defined types were placed in this encounter.   JeJanyth PupaDO Attending ObArroyo GrandeFaOur Lady Of Fat[facility] Hospitalor WoDe[facility] CompanyCoSanta [place]

## 2020-10-14 ENCOUNTER — Ambulatory Visit (INDEPENDENT_AMBULATORY_CARE_PROVIDER_SITE_OTHER): Payer: Medicaid Other

## 2020-10-14 ENCOUNTER — Encounter (HOSPITAL_COMMUNITY): Payer: Self-pay | Admitting: Family Medicine

## 2020-10-14 ENCOUNTER — Inpatient Hospital Stay (HOSPITAL_COMMUNITY)
Admission: AD | Admit: 2020-10-14 | Discharge: 2020-10-16 | DRG: 807 | Disposition: A | Discharge status: Home / Self Care | Payer: Medicaid Other | Attending: Obstetrics and Gynecology | Admitting: Obstetrics and Gynecology

## 2020-10-14 ENCOUNTER — Ambulatory Visit (INDEPENDENT_AMBULATORY_CARE_PROVIDER_SITE_OTHER): Payer: Medicaid Other | Admitting: Women's Health

## 2020-10-14 ENCOUNTER — Other Ambulatory Visit: Payer: Medicaid Other

## 2020-10-14 ENCOUNTER — Encounter: Payer: Self-pay | Admitting: Women's Health

## 2020-10-14 VITALS — BP 132/88 | HR 76 | Wt 156.0 lb

## 2020-10-14 DIAGNOSIS — O24410 Gestational diabetes mellitus in pregnancy, diet controlled: Secondary | ICD-10-CM

## 2020-10-14 DIAGNOSIS — O09513 Supervision of elderly primigravida, third trimester: Secondary | ICD-10-CM

## 2020-10-14 DIAGNOSIS — O24420 Gestational diabetes mellitus in childbirth, diet controlled: Secondary | ICD-10-CM | POA: Diagnosis present

## 2020-10-14 DIAGNOSIS — Z8632 Personal history of gestational diabetes: Secondary | ICD-10-CM | POA: Diagnosis present

## 2020-10-14 DIAGNOSIS — Z302 Encounter for sterilization: Secondary | ICD-10-CM

## 2020-10-14 DIAGNOSIS — Z975 Presence of (intrauterine) contraceptive device: Secondary | ICD-10-CM

## 2020-10-14 DIAGNOSIS — O09519 Supervision of elderly primigravida, unspecified trimester: Secondary | ICD-10-CM

## 2020-10-14 DIAGNOSIS — T148XXA Other injury of unspecified body region, initial encounter: Secondary | ICD-10-CM | POA: Diagnosis not present

## 2020-10-14 DIAGNOSIS — Z3A38 38 weeks gestation of pregnancy: Secondary | ICD-10-CM | POA: Diagnosis not present

## 2020-10-14 DIAGNOSIS — O133 Gestational [pregnancy-induced] hypertension without significant proteinuria, third trimester: Secondary | ICD-10-CM

## 2020-10-14 DIAGNOSIS — O134 Gestational [pregnancy-induced] hypertension without significant proteinuria, complicating childbirth: Secondary | ICD-10-CM | POA: Diagnosis present

## 2020-10-14 DIAGNOSIS — O0990 Supervision of high risk pregnancy, unspecified, unspecified trimester: Secondary | ICD-10-CM

## 2020-10-14 DIAGNOSIS — Z20822 Contact with and (suspected) exposure to covid-19: Secondary | ICD-10-CM | POA: Diagnosis present

## 2020-10-14 DIAGNOSIS — Q828 Other specified congenital malformations of skin: Secondary | ICD-10-CM

## 2020-10-14 DIAGNOSIS — Z8759 Personal history of other complications of pregnancy, childbirth and the puerperium: Secondary | ICD-10-CM | POA: Diagnosis present

## 2020-10-14 DIAGNOSIS — F32A Depression, unspecified: Secondary | ICD-10-CM | POA: Diagnosis present

## 2020-10-14 DIAGNOSIS — O24419 Gestational diabetes mellitus in pregnancy, unspecified control: Secondary | ICD-10-CM | POA: Diagnosis present

## 2020-10-14 DIAGNOSIS — O0993 Supervision of high risk pregnancy, unspecified, third trimester: Secondary | ICD-10-CM

## 2020-10-14 DIAGNOSIS — Z30430 Encounter for insertion of intrauterine contraceptive device: Secondary | ICD-10-CM | POA: Diagnosis not present

## 2020-10-14 DIAGNOSIS — Z3490 Encounter for supervision of normal pregnancy, unspecified, unspecified trimester: Secondary | ICD-10-CM | POA: Diagnosis present

## 2020-10-14 DIAGNOSIS — O139 Gestational [pregnancy-induced] hypertension without significant proteinuria, unspecified trimester: Secondary | ICD-10-CM | POA: Diagnosis present

## 2020-10-14 LAB — CBC
HCT: 36.7 % (ref 36.0–46.0)
HCT: 37.5 % (ref 36.0–46.0)
Hemoglobin: 13.1 g/dL (ref 12.0–15.0)
Hemoglobin: 12.9 g/dL (ref 12.0–15.0)
MCH: 33.4 pg (ref 26.0–34.0)
MCH: 32.7 pg (ref 26.0–34.0)
MCHC: 35.7 g/dL (ref 30.0–36.0)
MCHC: 34.4 g/dL (ref 30.0–36.0)
MCV: 93.6 fL (ref 80.0–100.0)
MCV: 94.9 fL (ref 80.0–100.0)
Platelets: 199 10*3/uL (ref 150–400)
Platelets: 198 10*3/uL (ref 150–400)
RBC: 3.92 MIL/uL (ref 3.87–5.11)
RBC: 3.95 MIL/uL (ref 3.87–5.11)
RDW: 13.2 % (ref 11.5–15.5)
RDW: 13.1 % (ref 11.5–15.5)
WBC: 9.1 10*3/uL (ref 4.0–10.5)
WBC: 9.9 10*3/uL (ref 4.0–10.5)
nRBC: 0 % (ref 0.0–0.2)
nRBC: 0 % (ref 0.0–0.2)

## 2020-10-14 LAB — TYPE AND SCREEN
ABO/RH(D): O POS
Antibody Screen: NEGATIVE

## 2020-10-14 LAB — POCT URINALYSIS DIPSTICK OB
Blood, UA: NEGATIVE
Glucose, UA: NEGATIVE
Ketones, UA: NEGATIVE
Leukocytes, UA: NEGATIVE
Nitrite, UA: NEGATIVE

## 2020-10-14 LAB — COMPREHENSIVE METABOLIC PANEL
ALT: 16 U/L (ref 0–44)
AST: 29 U/L (ref 15–41)
Albumin: 3 g/dL — ABNORMAL LOW (ref 3.5–5.0)
Alkaline Phosphatase: 139 U/L — ABNORMAL HIGH (ref 38–126)
Anion gap: 9 (ref 5–15)
BUN: 8 mg/dL (ref 6–20)
CO2: 21 mmol/L — ABNORMAL LOW (ref 22–32)
Calcium: 9.2 mg/dL (ref 8.9–10.3)
Chloride: 104 mmol/L (ref 98–111)
Creatinine, Ser: 0.74 mg/dL (ref 0.44–1.00)
GFR, Estimated: >60 mL/min (ref >60)
Glucose, Bld: 97 mg/dL (ref 70–99)
Potassium: 3.6 mmol/L (ref 3.5–5.1)
Sodium: 134 mmol/L — ABNORMAL LOW (ref 135–145)
Total Bilirubin: 0.7 mg/dL (ref 0.3–1.2)
Total Protein: 6.6 g/dL (ref 6.5–8.1)

## 2020-10-14 LAB — PROTEIN / CREATININE RATIO, URINE
Creatinine, Urine: 234.61 mg/dL
Protein Creatinine Ratio: 0.07 mg/mg{Cre} (ref 0.00–0.15)
Total Protein, Urine: 17 mg/dL

## 2020-10-14 LAB — GLUCOSE, CAPILLARY
Glucose-Capillary: 81 mg/dL (ref 70–99)
Glucose-Capillary: 83 mg/dL (ref 70–99)

## 2020-10-14 MED ORDER — TERBUTALINE SULFATE 1 MG/ML IJ SOLN: 0.2500 mg | Freq: Once | INTRAMUSCULAR | Status: DC | PRN

## 2020-10-14 MED ORDER — OXYTOCIN BOLUS FROM INFUSION
333.0000 mL | Freq: Once | INTRAVENOUS | Status: AC
  Administered 2020-10-15: 333 mL via INTRAVENOUS

## 2020-10-14 MED ORDER — LEVONORGESTREL 19.5 MCG/DAY IU IUD
INTRAUTERINE_SYSTEM | Freq: Once | INTRAUTERINE | Status: AC
  Administered 2020-10-15: 1 via INTRAUTERINE
  Filled 2020-10-14: qty 1

## 2020-10-14 MED ORDER — FENTANYL CITRATE (PF) 100 MCG/2ML IJ SOLN: 100.0000 ug | INTRAMUSCULAR | Status: DC | PRN

## 2020-10-14 MED ORDER — OXYTOCIN-SODIUM CHLORIDE 30-0.9 UT/500ML-% IV SOLN
2.5000 [IU]/h | INTRAVENOUS | Status: DC
  Administered 2020-10-15: 2.5 [IU]/h via INTRAVENOUS
  Filled 2020-10-14 (×2): qty 500

## 2020-10-14 MED ORDER — LACTATED RINGERS IV SOLN: INTRAVENOUS | Status: DC

## 2020-10-14 MED ORDER — ONDANSETRON HCL 4 MG/2ML IJ SOLN: 4.0000 mg | Freq: Four times a day (QID) | INTRAMUSCULAR | Status: DC | PRN

## 2020-10-14 MED ORDER — SOD CITRATE-CITRIC ACID 500-334 MG/5ML PO SOLN: 30.0000 mL | ORAL | Status: DC | PRN

## 2020-10-14 MED ORDER — OXYTOCIN-SODIUM CHLORIDE 30-0.9 UT/500ML-% IV SOLN
1.0000 m[IU]/min | INTRAVENOUS | Status: DC
  Administered 2020-10-14: 2 m[IU]/min via INTRAVENOUS

## 2020-10-14 MED ORDER — LACTATED RINGERS IV SOLN
500.0000 mL | INTRAVENOUS | Status: DC | PRN
  Administered 2020-10-15: 1000 mL via INTRAVENOUS

## 2020-10-14 MED ORDER — LIDOCAINE HCL (PF) 1 % IJ SOLN: 30.0000 mL | INTRAMUSCULAR | Status: DC | PRN

## 2020-10-14 NOTE — H&P (Addendum)
OBSTETRIC ADMISSION HISTORY AND PHYSICAL  Erin Dennis is a 40 y.o. female G3P0020 with IUP at 69w0dby LMP = 5wk u/s presenting for IOL due to gHadar Reports intermittent headache x1 week and home BPs up to 1741Oand 1878Msystolic. Headache has since resolved. Sent from [facility] today, BP 121/91 in clinic. No vision changes, peripheral edema or RUQ pain. She reports +FMs, No LOF, no VB. She plans on breast feeding. She requests BTL for birth control (signed papers 08/19/20) and pp IUD placement for menorrhagia. [facility]  Dating: By LMP = 5wk ultrasound --->  Estimated Date of Delivery: 10/28/20  Sono:   [facility]w0[facility], EFW 71%ile _0 , CWD, breech presentation _1  normal anatomy, EFW 69%ile   Prenatal History/Complications: gHTN, A1V6HMCAMA, depression (not on meds)  Past Medical History: Past Medical History:  Diagnosis Date  . Anxiety   . Darier disease   . Depression    following miscarriage  . Gestational diabetes   . Infection    UTI  . Ovarian cyst     Past Surgical History: Past Surgical History:  Procedure Laterality Date  . APPENDECTOMY    . CHOLECYSTECTOMY    . DILATION AND CURETTAGE OF UTERUS     for menorrhagia  . DILATION AND CURETTAGE OF UTERUS N/A 10/07/2019   Procedure: SUCTION DILATATION AND CURETTAGE;  Surgeon: EuFlorian BuffMD;  Location: [facility];  Service: Gynecology;  Laterality: N/A;  Dr. Request Time 9:30am  . OVARIAN CYST SURGERY     x3  . TONSILLECTOMY    . TONSILLECTOMY  2007  . WISDOM TOOTH EXTRACTION      Obstetrical History: OB History    Gravida  3   Para      Term      Preterm      AB  2   Living        SAB  1   IAB      Ectopic  1   Multiple      Live Births              Social History Social History   Socioeconomic History  . Marital status: Married    Spouse name: Not on file  . Number of children: Not on file  . Years  of education: Not on file  . Highest education level: Not on file  Occupational History  . Not on file  Tobacco Use  . Smoking status: Never Smoker  . Smokeless tobacco: Never Used  Vaping Use  . Vaping Use: Never used  Substance and Sexual Activity  . Alcohol use: Not Currently  . Drug use: Not Currently  . Sexual activity: Yes    Birth control/protection: None  Other Topics Concern  . Not on file  Social History Narrative  . Not on file   Social Determinants of Health   Financial Resource Strain: Low Risk   . Difficulty of Paying Living Expenses: Not very hard  Food Insecurity: No Food Insecurity  . Worried About RuCharity fundraisern the Last Year: Never true  . Ran Out of Food in the Last Year: Never true  Transportation Needs: No Transportation Needs  . Lack of Transportation (Medical): No  . Lack of Transportation (Non-Medical): No  Physical Activity: Sufficiently Active  . Days of Exercise per Week: 5 days  . Minutes of Exercise per Session: 30 min  Stress:  No Stress Concern Present  . Feeling of Stress : Only a little  Social Connections: Moderately Integrated  . Frequency of Communication with Friends and Family: More than three times a week  . Frequency of Social Gatherings with Friends and Family: Twice a week  . Attends Religious Services: 1 to 4 times per year  . Active Member of Clubs or Organizations: No  . Attends Archivist Meetings: Never  . Marital Status: Married    Family History: Family History  Problem Relation Age of Onset  . Diabetes Mother   . Hypertension Mother   . Diabetes Father   . Stroke Father   . Cancer Maternal Grandmother        ovarian    Allergies: Allergies  Allergen Reactions  . Acetaminophen Anaphylaxis and Rash  . 5-Alpha Reductase Inhibitors   . Sulfamethoxazole-Trimethoprim Other (See Comments)    Blisters  . Tape Rash    Medications Prior to Admission  Medication Sig Dispense Refill Last Dose  .  Accu-Chek Softclix Lancets lancets Use as instructed to check blood sugar 4 times daily 100 each 12   . Blood Glucose Monitoring Suppl (ACCU-CHEK GUIDE ME) w/Device KIT 1 each by Does not apply route 4 (four) times daily. 1 kit 0   . Blood Pressure Monitor MISC For regular home bp monitoring during pregnancy 1 each 0   . glucose blood (ACCU-CHEK GUIDE) test strip Use as instructed to check blood sugar 4 times daily 50 each 12   . Prenatal Vit-Fe Fumarate-FA (MULTIVITAMIN-PRENATAL) 27-0.8 MG TABS tablet Take 1 tablet by mouth daily at 12 noon.        Review of Systems   All systems reviewed and negative except as stated in HPI  Last menstrual period 01/22/2020. General appearance: alert, cooperative and no distress Lungs: normal WOB Abdomen: gravid, nontender Extremities: no sign of DVT Presentation: cephalic by cervical exam Fetal monitoringBaseline: 135 bpm, Variability: Good {> 6 bpm), Accelerations: Reactive and Decelerations: Absent Uterine activity: irregular    Prenatal labs: ABO, Rh: O/Positive/-- (08/23 0000) Antibody: Negative (12/29 0833) Rubella: Immune (08/23 0000) RPR: Non Reactive (12/29 0833)  HBsAg: Negative (08/23 0000)  HIV: Non Reactive (12/29 6734)  GBS: Negative/-- (03/08 1630)  2 hr Glucola Failed (100/247/154) Genetic screening NT/IT: neg/neg Anatomy US normal  Prenatal Transfer Tool  Maternal Diabetes: Yes:  Diabetes Type:  Diet controlled Genetic Screening: Normal Maternal Ultrasounds/Referrals: Normal Fetal Ultrasounds or other Referrals:  None Maternal Substance Abuse:  No Significant Maternal Medications:  None Significant Maternal Lab Results: Group B Strep negative  Results for orders placed or performed in visit on 10/14/20 (from the past 24 hour(s))  POC Urinalysis Dipstick OB   Collection Time: 10/14/20 12:00 PM  Result Value Ref Range   Color, UA     Clarity, UA     Glucose, UA Negative Negative   Bilirubin, UA     Ketones, UA neg     Spec Grav, UA     Blood, UA neg    pH, UA     POC,PROTEIN,UA Small (1+) Negative, Trace, Small (1+), Moderate (2+), Large (3+), 4+   Urobilinogen, UA     Nitrite, UA neg    Leukocytes, UA Negative Negative   Appearance     Odor      Patient Active Problem List   Diagnosis Date Noted  . Gestational hypertension 10/14/2020  . Encounter for induction of labor 10/14/2020  . 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 Gestational  diabetes 07/30/2020  . Request for sterilization 06/03/2020  . AMA (advanced maternal age) primigravida 35+ 05/13/2020  . Supervision of high-risk pregnancy 04/15/2020  . Depression 09/16/2008    Assessment/Plan:  Erin Dennis is a 40 y.o. G3P0020 at 74w0dhere for IOL due to newly diagnosed gHTN.  #IOL: Discussed IOL process with patient. Cervix 3/90/ on admission. Will start pitocin 2x2.  #gHTN: Diagnosed in the office today due to elevated BP on 3/7 during MAU visit and elevated home BPs. Also noted to have 1+ proteinuria in the office today. BP 132/91 on admission. Will check CMP and UP:C. Endorses headache, improved from prior but no vision changes, peripheral edema or RUQ pain. Continue to monitor. #A1GDM: EFW 71%ile on u/s today. Glucose 81 on admission. 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 Not on any meds. Plan for pp SW consult #Pain: Prn per patient request, desires epidural. Of note, patient has anaphylaxis to acetaminophen.  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 #Hx of severe uterine atony s/p D&C   AAlcus Dad MD  10/14/2020, 2:06 PM  GME ATTESTATION:  I saw and evaluated the patient. I agree with the findings and  the plan of care as documented in the resident's note.  AArrie Senate MD OB Fellow, FVinelandfor WHarvest3/16/2022 4:14 PM

## 2020-10-14 NOTE — Progress Notes (Signed)
HIGH-RISK PREGNANCY VISIT Patient name: Erin Dennis MRN [id number]  Date of birth: [birth year] Chief Complaint:   Routine Prenatal Visit  History of Present Illness:   Payshence Ringgold is a 40 y.o. G6P0020 female at 62w0dwith an Estimated Date of Delivery: 10/28/20 being seen today for ongoing management of a high-risk pregnancy complicated by diabetes mellitus A1DM.  Today she reports home bp's 140-150s/90s, headaches x 1wk- can't take apap as she is allergic, eventually go away on their own, feels like she is getting another now. Denies visual changes, ruq/epigastric pain, n/v. All FBS <95, only 3 of her 2hr pp >120.  Dep[facility] screen [facility] 08/12/2020 04/15/2020 12/06/2019  Decreased Interest 0 0 1  Down, Depressed, Hopeless 0 0 1  PHQ - 2 Score 0 0 2  Altered sleeping - 1 3  Tired, decreased energy - 1 2  Change in appetite - 1 1  Feeling bad or failure about yourself  - 0 0  Trouble concentrating - 0 0  Moving slowly or fidgety/restless - 0 0  Suicidal thoughts - 0 0  PHQ-9 Score - 3 8    Contractions: Irregular. Vag. Bleeding: None.  Movement: Present. denies leaking of fluid.  Review of Systems:   Pertinent items are noted in HPI Denies abnormal vaginal discharge w/ itching/odor/irritation, headaches, visual changes, shortness of breath, chest pain, abdominal pain, severe nausea/vomiting, or problems with urination or bowel movements unless otherwise stated above. Pertinent History Reviewed:  Reviewed past medical,surgical, social, obstetrical and family history.  Reviewed problem list, medications and allergies. Physical Assessment:   Vitals:   10/14/20 1154 10/14/20 1158  BP: (!) 121/91 132/88  Pulse: 76 76  Weight: 156 lb (70.8 kg)   Body mass index is 29.48 kg/m.           Physical Examination:   General appearance: alert, well appearing, and in no distress  Mental status: alert, oriented to person, place, and time  Skin: warm & dry   Extremities: Edema: Trace     Cardiovascular: normal heart rate noted  Respiratory: normal respiratory effort, no distress  Abdomen: gravid, soft, non-tender  Pelvic: Cervical exam performed  Dilation: 1.5 Effacement (%): 90 Station: -2  Fetal Status: Fetal Heart Rate (bpm): 130u/s   Movement: Present Presentation: Vertex  Fetal Surveillance Testing today:  UKorea38 wks,cephalic,BPP [id number]-[id number],anterior placenta gr 3,AFI 14.9 cm,EFW 3479 g 71%  Chaperone: ACelene Squibb   Results for orders placed or performed in visit on 10/14/20 (from the past 24 hour(s))  POC Urinalysis Dipstick OB   Collection Time: 10/14/20 12:00 PM  Result Value Ref Range   Color, UA     Clarity, UA     Glucose, UA Negative Negative   Bilirubin, UA     Ketones, UA neg    Spec Grav, UA     Blood, UA neg    pH, UA     POC,PROTEIN,UA Small (1+) Negative, Trace, Small (1+), Moderate (2+), Large (3+), 4+   Urobilinogen, UA     Nitrite, UA neg    Leukocytes, UA Negative Negative   Appearance     Odor      Assessment & Plan:  High-risk pregnancy: G3P0020 at 359w0dith an Estimated Date of Delivery: 10/28/20   1) A1DM, stable  2) GHTN, officially dx today (elevated bp 3/7 MAU visit and home bp's elevated) w/ headache and 1+ proteinuria on dipstick- so [facility]e. [facility]. Notified labor team  and L&D charge.   Meds: No orders of the defined types were placed in this encounter.   Labs/procedures today: SVE and U/S  Treatment Plan:  To [facility]  Follow-up: Return for will schedule bp check pp.   Future Appointments  Date Time Provider [place]  10/21/2020 11:15 AM [facility] [place] [facility] None  10/21/2020 12:10 PM [name], DO [facility]    Orders Placed This Encounter  Procedures  . POC Urinalysis Dipstick OB   [name] CNM, [facility] 10/14/2020 12:37 PM

## 2020-10-14 NOTE — Progress Notes (Signed)
Labor Progress Note Erin Dennis is a 40 y.o. G3P0020 at 62w0dpresented for IOL for gHTN   S: Feeling fine, starting to feel some contractions    O:  BP 131/85   Pulse 71   Temp 98.9 F (37.2 C) (Oral)   Resp 16   Ht '5\' 1"'$  (1.549 m)   Wt 70.1 kg   LMP 01/22/2020   SpO2 99%   BMI 29.21 kg/m  EFM: 145/mod variability/pos accels/no decels   CVE: Dilation: 4 Effacement (%): 80 Station: -3 Presentation: Vertex Exam by:: AArmanda Heritage RN   A&P: 40y.o. [id number] for IOL for gHTN. #Labor: Pitocin started at 1400, currently at 10. Continue titrating pitocin.  #gHTN: has been normotensive overall since admission. PEC labs normal.   #A1GDM: EFW 3479g'@38wk'$ . CBG 81>83, continue q4 #Pain: pain meds, epidural prn  #FWB: cat I  #GBS negative  #Contraception: Rediscussed with patient at bedside, she elects for pp Liletta. Consented at bedside. Ordered.   JuJanet BerlinMD 9:35 PM

## 2020-10-14 NOTE — Progress Notes (Signed)
[place] 38 wks,cephalic,BPP [id number] AB-[id number] BPM,anterior placenta gr 3,AFI 14.9 cm,EFW 3479 g 71%

## 2020-10-15 ENCOUNTER — Inpatient Hospital Stay (HOSPITAL_COMMUNITY): Payer: Medicaid Other | Admitting: Anesthesiology

## 2020-10-15 DIAGNOSIS — T148XXA Other injury of unspecified body region, initial encounter: Secondary | ICD-10-CM | POA: Diagnosis not present

## 2020-10-15 DIAGNOSIS — Z975 Presence of (intrauterine) contraceptive device: Secondary | ICD-10-CM

## 2020-10-15 DIAGNOSIS — Z30430 Encounter for insertion of intrauterine contraceptive device: Secondary | ICD-10-CM | POA: Diagnosis not present

## 2020-10-15 DIAGNOSIS — Z3A38 38 weeks gestation of pregnancy: Secondary | ICD-10-CM

## 2020-10-15 HISTORY — DX: Other injury of unspecified body region, initial encounter: T14.8XXA

## 2020-10-15 LAB — CBC
HCT: 36.8 % (ref 36.0–46.0)
Hemoglobin: 12.6 g/dL (ref 12.0–15.0)
MCH: 32.2 pg (ref 26.0–34.0)
MCHC: 34.2 g/dL (ref 30.0–36.0)
MCV: 94.1 fL (ref 80.0–100.0)
Platelets: 199 10*3/uL (ref 150–400)
RBC: 3.91 MIL/uL (ref 3.87–5.11)
RDW: 13.2 % (ref 11.5–15.5)
WBC: 9.7 10*3/uL (ref 4.0–10.5)
nRBC: 0 % (ref 0.0–0.2)

## 2020-10-15 LAB — GLUCOSE, CAPILLARY
Glucose-Capillary: 126 mg/dL — ABNORMAL HIGH (ref 70–99)
Glucose-Capillary: 88 mg/dL (ref 70–99)
Glucose-Capillary: 95 mg/dL (ref 70–99)
Glucose-Capillary: 112 mg/dL — ABNORMAL HIGH (ref 70–99)
Glucose-Capillary: 85 mg/dL (ref 70–99)

## 2020-10-15 LAB — RESP PANEL BY RT-PCR (FLU A&B, COVID) ARPGX2
Influenza A by PCR: NEGATIVE
Influenza B by PCR: NEGATIVE
SARS Coronavirus 2 by RT PCR: NEGATIVE

## 2020-10-15 LAB — RPR: RPR Ser Ql: NONREACTIVE

## 2020-10-15 MED ORDER — SENNOSIDES-DOCUSATE SODIUM 8.6-50 MG PO TABS: 2.0000 | ORAL_TABLET | Freq: Every evening | ORAL | Status: DC | PRN

## 2020-10-15 MED ORDER — ONDANSETRON HCL 4 MG/2ML IJ SOLN
4.0000 mg | INTRAMUSCULAR | Status: DC | PRN
Start: 2020-10-15 — End: 2020-10-16

## 2020-10-15 MED ORDER — LIDOCAINE-EPINEPHRINE (PF) 2 %-1:200000 IJ SOLN
INTRAMUSCULAR | Status: DC | PRN
  Administered 2020-10-15: 4 mL via EPIDURAL

## 2020-10-15 MED ORDER — EPHEDRINE 5 MG/ML INJ: 10.0000 mg | INTRAVENOUS | Status: DC | PRN

## 2020-10-15 MED ORDER — FENTANYL-BUPIVACAINE-NACL 0.5-0.125-0.9 MG/250ML-% EP SOLN
12.0000 mL/h | EPIDURAL | Status: DC | PRN
  Administered 2020-10-15: 12 mL/h via EPIDURAL
  Filled 2020-10-15: qty 250

## 2020-10-15 MED ORDER — SIMETHICONE 80 MG PO CHEW: 80.0000 mg | CHEWABLE_TABLET | ORAL | Status: DC | PRN

## 2020-10-15 MED ORDER — DIPHENHYDRAMINE HCL 25 MG PO CAPS
25.0000 mg | ORAL_CAPSULE | Freq: Four times a day (QID) | ORAL | Status: DC | PRN
Start: 2020-10-15 — End: 2020-10-16

## 2020-10-15 MED ORDER — OXYCODONE HCL 5 MG PO TABS
5.0000 mg | ORAL_TABLET | Freq: Four times a day (QID) | ORAL | Status: DC | PRN
  Administered 2020-10-16: 5 mg via ORAL
  Filled 2020-10-15: qty 1

## 2020-10-15 MED ORDER — DIPHENHYDRAMINE HCL 50 MG/ML IJ SOLN: 12.5000 mg | INTRAMUSCULAR | Status: DC | PRN

## 2020-10-15 MED ORDER — PHENYLEPHRINE 40 MCG/ML (10ML) SYRINGE FOR IV PUSH (FOR BLOOD PRESSURE SUPPORT): 80.0000 ug | PREFILLED_SYRINGE | INTRAVENOUS | Status: DC | PRN

## 2020-10-15 MED ORDER — BENZOCAINE-MENTHOL 20-0.5 % EX AERO
1.0000 "application " | INHALATION_SPRAY | CUTANEOUS | Status: DC | PRN
  Administered 2020-10-15: 1 via TOPICAL
  Filled 2020-10-15: qty 56

## 2020-10-15 MED ORDER — IBUPROFEN 600 MG PO TABS
600.0000 mg | ORAL_TABLET | Freq: Four times a day (QID) | ORAL | Status: DC
  Administered 2020-10-15 – 2020-10-16 (×3): 600 mg via ORAL
  Filled 2020-10-15 (×4): qty 1

## 2020-10-15 MED ORDER — OXYTOCIN-SODIUM CHLORIDE 30-0.9 UT/500ML-% IV SOLN: 2.5000 [IU]/h | INTRAVENOUS | Status: DC | PRN

## 2020-10-15 MED ORDER — ONDANSETRON HCL 4 MG PO TABS: 4.0000 mg | ORAL_TABLET | ORAL | Status: DC | PRN

## 2020-10-15 MED ORDER — PRENATAL MULTIVITAMIN CH
1.0000 | ORAL_TABLET | Freq: Every day | ORAL | Status: DC
  Filled 2020-10-15: qty 1

## 2020-10-15 MED ORDER — LACTATED RINGERS IV SOLN: 500.0000 mL | Freq: Once | INTRAVENOUS | Status: DC

## 2020-10-15 MED ORDER — COCONUT OIL OIL
1.0000 | TOPICAL_OIL | Status: DC | PRN
Start: 2020-10-15 — End: 2020-10-16

## 2020-10-15 MED ORDER — TRANEXAMIC ACID-NACL 1000-0.7 MG/100ML-% IV SOLN
INTRAVENOUS | Status: AC
  Filled 2020-10-15: qty 100

## 2020-10-15 MED ORDER — WITCH HAZEL-GLYCERIN EX PADS
1.0000 "application " | MEDICATED_PAD | CUTANEOUS | Status: DC | PRN
  Administered 2020-10-15: 1 via TOPICAL

## 2020-10-15 MED ORDER — TETANUS-DIPHTH-ACELL PERTUSSIS 5-2.5-18.5 LF-MCG/0.5 IM SUSY: 0.5000 mL | PREFILLED_SYRINGE | Freq: Once | INTRAMUSCULAR | Status: DC

## 2020-10-15 MED ORDER — TRANEXAMIC ACID-NACL 1000-0.7 MG/100ML-% IV SOLN
1000.0000 mg | INTRAVENOUS | Status: AC
  Administered 2020-10-15: 1000 mg via INTRAVENOUS

## 2020-10-15 MED ORDER — DIBUCAINE (PERIANAL) 1 % EX OINT: 1.0000 "application " | TOPICAL_OINTMENT | CUTANEOUS | Status: DC | PRN

## 2020-10-15 NOTE — Anesthesia Preprocedure Evaluation (Signed)
Anesthesia Evaluation  Patient identified by MRN, date of birth, ID band Patient awake    Reviewed: Allergy & Precautions, NPO status , Patient's Chart, lab work & pertinent test results  Airway Mallampati: II  TM Distance: >3 FB Neck ROM: Full    Dental no notable dental hx.    Pulmonary neg pulmonary ROS,    Pulmonary exam normal breath sounds clear to auscultation       Cardiovascular hypertension (gHTN), Normal cardiovascular exam Rhythm:Regular Rate:Normal     Neuro/Psych negative neurological ROS  negative psych ROS   GI/Hepatic negative GI ROS, Neg liver ROS,   Endo/Other  diabetes, Gestational  Renal/GU negative Renal ROS  negative genitourinary   Musculoskeletal negative musculoskeletal ROS (+)   Abdominal   Peds  Hematology negative hematology ROS (+)   Anesthesia Other Findings IOL for gHTN  Reproductive/Obstetrics (+) Pregnancy                             Anesthesia Physical Anesthesia Plan  ASA: III  Anesthesia Plan: Epidural   Post-op Pain Management:    Induction:   PONV Risk Score and Plan: Treatment may vary due to age or medical condition  Airway Management Planned: Natural Airway  Additional Equipment:   Intra-op Plan:   Post-operative Plan:   Informed Consent: I have reviewed the patients History and Physical, chart, labs and discussed the procedure including the risks, benefits and alternatives for the proposed anesthesia with the patient or authorized representative who has indicated his/her understanding and acceptance.       Plan Discussed with: Anesthesiologist  Anesthesia Plan Comments: (Patient identified. Risks, benefits, options discussed with patient including but not limited to bleeding, infection, nerve damage, paralysis, failed block, incomplete pain control, headache, blood pressure changes, nausea, vomiting, reactions to medication,  itching, and post partum back pain. Confirmed with bedside nurse the patient's most recent platelet count. Confirmed with the patient that they are not taking any anticoagulation, have any bleeding history or any family history of bleeding disorders. Patient expressed understanding and wishes to proceed. All questions were answered. )        Anesthesia Quick Evaluation

## 2020-10-15 NOTE — Progress Notes (Incomplete)
@  0003 Blood Glucose taken by RN. CBG level 126. MD notified.   Chief Financial Officer

## 2020-10-15 NOTE — Lactation Note (Signed)
Lactation Consultation Note  Patient Name: Erin Dennis Today's Date: 10/15/2020 Reason for consult: L&D Initial assessment;Early term 37-38.6wks;Primapara;1st time breastfeeding Age:40 y.o.   L & D Initial Consult:  Baby STS on mother's chest when I arrived.  Offered to assist with latching.  Mother hesitant but willing to try.  Mother stated, "If it is easy, I will do it." Mother uncertain at this time if she will even breast feed.  Assisted baby to latch, however, he was not very interested in sucking.  With gentle stimulation he sucked on/off for 4 minutes.  I noticed baby with tachypnea (70 bpm) and beginning to show mild subcostal retractions.  Stopped the feeding and placed him STS on mother's chest; covered with a blanket.  L & D and 4th floor RNs notified.  Reassured mother that lactation assistance will be offered on the M/B unit as desired.  Family present.   Maternal Data    Feeding Mother's Current Feeding Choice: Breast Milk  LATCH Score Latch: Repeated attempts needed to sustain latch, nipple held in mouth throughout feeding, stimulation needed to elicit sucking reflex.  Audible Swallowing: None  Type of Nipple: Everted at rest and after stimulation  Comfort (Breast/Nipple): Soft / non-tender  Hold (Positioning): Assistance needed to correctly position infant at breast and maintain latch.  LATCH Score: 6   Lactation Tools Discussed/Used    Interventions Interventions: Assisted with latch;Skin to skin;Support pillows;Education  Discharge    Consult Status Consult Status: Follow-up Date: 10/15/20 Follow-up type: In-patient    Beth R DelFava 10/15/2020, 2:58 PM

## 2020-10-15 NOTE — Lactation Note (Signed)
This note was copied from a baby's chart. Lactation Consultation Note  Patient Name: Boy Elara Batemon Today's Date: 10/15/2020   Age:39 hours  Visited with mom of 93 hour old ETI female, when [place] offered latch assistance mom voiced that she won't be BF due to her skin disease and the medicines that she's taking for it (she had to be off them just to be pregnant, and will start taking them again). Mom has decided for formula fed only, LC services no longer needed.   Maternal Data    Feeding Nipple Type: Regular  LATCH Score Latch: Repeated attempts needed to sustain latch, nipple held in mouth throughout feeding, stimulation needed to elicit sucking reflex.  Audible Swallowing: None  Type of Nipple: Everted at rest and after stimulation  Comfort (Breast/Nipple): Soft / non-tender  Hold (Positioning): Assistance needed to correctly position infant at breast and maintain latch.  LATCH Score: 6   Lactation Tools Discussed/Used    Interventions    Discharge    Consult Status      Milagros Francene Boyers 10/15/2020, 3:45 PM

## 2020-10-15 NOTE — Discharge Instructions (Signed)
Postpartum Care After Vaginal Delivery The following information offers guidance about how to care for yourself from the time you deliver your baby to 6-12 weeks after delivery (postpartum period). If you have problems or questions, contact your health care provider for more specific instructions. Follow these instructions at home: Vaginal bleeding  It is normal to have vaginal bleeding (lochia) after delivery. Wear a sanitary pad for bleeding and discharge. ? During the first week after delivery, the amount and appearance of lochia is often similar to a menstrual period. ? Over the next few weeks, it will gradually decrease to a dry, yellow-brown discharge. ? For most women, lochia stops completely by 4-6 weeks after delivery, but can vary.  Change your sanitary pads frequently. Watch for any changes in your flow, such as: ? A sudden increase in volume. ? A change in color. ? Large blood clots.  If you pass a blood clot from your vagina, save it and call your health care provider. Do not flush blood clots down the toilet before talking with your health care provider.  Do not use tampons or douches until your health care provider approves.  If you are not breastfeeding, your period should return 6-8 weeks after delivery. If you are feeding your baby breast milk only, your period may not return until you stop breastfeeding. Perineal care  Keep the area between the vagina and the anus (perineum) clean and dry. Use medicated pads and pain-relieving sprays and creams as directed.  If you had a surgical cut in the perineum (episiotomy) or a tear, check the area for signs of infection until you are healed. Check for: ? More redness, swelling, or pain. ? Fluid or blood coming from the cut or tear. ? Warmth. ? Pus or a bad smell.  You may be given a squirt bottle to use instead of wiping to clean the perineum area after you use the bathroom. Pat the area gently to dry it.  To relieve pain  caused by an episiotomy, a tear, or swollen veins in the anus (hemorrhoids), take a warm sitz bath 2-3 times a day. In a sitz bath, the warm water should only come up to your hips and cover your buttocks.   Breast care  In the first few days after delivery, your breasts may feel heavy, full, and uncomfortable (breast engorgement). Milk may also leak from your breasts. Ask your health care provider about ways to help relieve the discomfort.  If you are breastfeeding: ? Wear a bra that supports your breasts and fits well. Use breast pads to absorb milk that leaks. ? Keep your nipples clean and dry. Apply creams and ointments as told. ? You may have uterine contractions every time you breastfeed for up to several weeks after delivery. This helps your uterus return to its normal size. ? If you have any problems with breastfeeding, notify your health care provider or lactation consultant.  If you are not breastfeeding: ? Avoid touching your breasts. Do not squeeze out (express) milk. Doing this can make your breasts produce more milk. ? Wear a good-fitting bra and use cold packs to help with swelling. Intimacy and sexuality  Ask your health care provider when you can engage in sexual activity. This may depend upon: ? Your risk of infection. ? How fast you are healing. ? Your comfort and desire to engage in sexual activity.  You are able to get pregnant after delivery, even if you have not had your period. Talk  with your health care provider about methods of birth control (contraception) or family planning if you desire future pregnancies. Medicines  Take over-the-counter and prescription medicines only as told by your health care provider.  Take an over-the-counter stool softener to help ease bowel movements as told by your health care provider.  If you were prescribed an antibiotic medicine, take it as told by your health care provider. Do not stop taking the antibiotic even if you start to  feel better.  Review all previous and current prescriptions to check for possible transfer into breast milk. Activity  Gradually return to your normal activities as told by your health care provider.  Rest as much as possible. Nap while your baby is sleeping. Eating and drinking  Drink enough fluid to keep your urine pale yellow.  To help prevent or relieve constipation, eat high-fiber foods every day.  Choose healthy eating to support breastfeeding or weight loss goals.  Take your prenatal vitamins until your health care provider tells you to stop.   General tips/recommendations  Do not use any products that contain nicotine or tobacco. These products include cigarettes, chewing tobacco, and vaping devices, such as e-cigarettes. If you need help quitting, ask your health care provider.  Do not drink alcohol, especially if you are breastfeeding.  Do not take medications or drugs that are not prescribed to you, especially if you are breastfeeding.  Visit your health care provider for a postpartum checkup within the first 3-6 weeks after delivery.  Complete a comprehensive postpartum visit no later than 12 weeks after delivery.  Keep all follow-up visits for you and your baby. Contact a health care provider if:  You feel unusually sad or worried.  Your breasts become red, painful, or hard.  You have a fever or other signs of an infection.  You have bleeding that is soaking through one pad an hour or you have blood clots.  You have a severe headache that doesn't go away or you have vision changes.  You have nausea and vomiting and are unable to eat or drink anything for 24 hours. Get help right away if:  You have chest pain or difficulty breathing.  You have sudden, severe leg pain.  You faint or have a seizure.  You have thoughts about hurting yourself or your baby. If you ever feel like you may hurt yourself or others, or have thoughts about taking your own life,  get help right away. Go to your nearest emergency department or:  Call your local emergency services (911 in the [place]  The National Suicide Prevention Lifeline at 308-832-3745. This suicide crisis helpline is open 24 hours a day.  Text the Crisis Text Line at 626-293-2565 (in the [place].). Summary  The period of time after you deliver your newborn up to 6-12 weeks after delivery is called the postpartum period.  Keep all follow-up visits for you and your baby.  Review all previous and current prescriptions to check for possible transfer into breast milk.  Contact a health care provider if you feel unusually sad or worried during the postpartum period. This information is not intended to replace advice given to you by your health care provider. Make sure you discuss any questions you have with your health care provider. Document Revised: 04/02/2020 Document Reviewed: 04/02/2020 Elsevier Patient Education  2021 [employer].  Hormonal Contraception Information Hormonal contraception is a type of birth control that uses hormones to prevent pregnancy. It usually involves a combination of the hormones  estrogen and progesterone, or only the hormone progesterone. Hormonal contraception works in these ways:  It thickens the mucus in the cervix, which is the lowest part of the uterus. Thicker mucus makes it harder for sperm to enter the uterus.  It changes the lining of the uterus. This makes it harder for an egg to attach or implant.  It may stop the ovaries from releasing eggs (ovulation). Some women who take hormonal contraceptives that contain only progesterone may continue to ovulate. Hormonal contraception cannot prevent STIs (sexually transmitted infections). Pregnancy may still occur. Types of hormonal contraception Estrogen and progesterone contraceptives Contraceptives that use a combination of estrogen and progesterone are available in these forms:  Pill. Pills come in different  combinations of hormones. Pills must be taken at the same time each day. They can affect your period. You can get your period monthly, once every 3 months, or not at all.  Patch. The patch is applied to the buttocks, abdomen, upper outer arm, or back. It is kept in place for 3 weeks. It is removed for the last or fourth week of the cycle.  Vaginal ring. The ring is placed in the vagina and left there for 3 weeks. It is then removed for the last or fourth week of the cycle.   Progesterone-only contraceptives Contraceptives that use only progesterone are available in these forms:  Pill. Pills should be taken at the same time everyday. This is very important to decrease the chance of pregnancy. Pills containing progestin-only are usually taken every day of the cycle. Other types of pills may have a placebo tablet for the last 4 days of every cycle.  Intrauterine device (IUD). This device is inserted through the vagina and cervix into the uterus. It is removed or replaced every 3 to 5 years, depending on the type. It can be removed sooner.  Implant. Plastic rods are placed under the skin of the upper arm. They are removed or replaced every 3 years. They can be removed sooner.  Shot (injection). The injection is given once every 12 or 13 weeks (about 3 months).   Risks associated with hormonal contraception Estrogen and progesterone contraceptives can sometimes cause side effects, such as:  Nausea.  Headaches.  Breast tenderness.  Bleeding or spotting between menstrual cycles.  High blood pressure (rare).  Strokes, heart attacks, or blood clots (rare). Progesterone-only contraceptives also can have side effects, such as:  Nausea.  Headaches.  Breast tenderness.  Irregular menstrual bleeding.  High blood pressure (rare). Talk to your health care provider about what side effects may mean for you. Questions to ask:  What type of hormonal contraception is right for me?  How long  should I plan to use hormonal contraception?  What are the side effects of the hormonal contraception method I choose?  How can I prevent STIs while using hormonal contraception? Where to find more information Ask your health care provider for more information and resources about hormonal contraception. You can also go to:  [place] Department of Health and [employer], Office on Women's Health: [place] Summary  Estrogen and progesterone are hormones used in many forms of birth control.  Hormonal contraception cannot prevent STIs (sexually transmitted infections).  Talk to your health care provider about what side effects may mean for you.  Ask your health care provider for more information and resources about hormonal contraception. This information is not intended to replace advice given to you by your health care provider. Make sure you discuss  any questions you have with your health care provider. Document Revised: 03/25/2020 Document Reviewed: 03/25/2020 Elsevier Patient Education  2021 [employer].

## 2020-10-15 NOTE — Procedures (Signed)
  Post-Placental IUD Insertion Procedure Note  Patient identified, informed consent signed prior to delivery, signed copy in chart, time out was performed.    Vaginal, labial and perineal areas thoroughly inspected for lacerations. Second degree laceration identified and left labial - not hemostatic, not repaired prior to insertion of IUD.   Liletta  - IUD grasped between sterile gloved fingers. Sterile lubrication applied to sterile gloved hand for ease of insertion. Fundus identified through abdominal wall using non-insertion hand. IUD inserted to fundus with bimanual technique. IUD carefully released at the fundus and insertion hand gently removed from vagina.   Patient given post procedure instructions and IUD care card with expiration date.  Patient is asked to keep IUD strings tucked in her vagina until her postpartum follow up visit in 4-6 weeks. Patient advised to abstain from sexual intercourse and pulling on strings before her follow-up visit. Patient verbalized an understanding of the plan of care and agrees.   [name], MD OB Fellow, [place] [place] 10/15/2020 2:29 PM

## 2020-10-15 NOTE — Anesthesia Procedure Notes (Signed)
Epidural Patient location during procedure: OB Start time: 10/15/2020 5:55 AM End time: 10/15/2020 6:05 AM  Staffing Anesthesiologist: Freddrick March, MD Performed: anesthesiologist   Preanesthetic Checklist Completed: patient identified, IV checked, risks and benefits discussed, monitors and equipment checked, pre-op evaluation and timeout performed  Epidural Patient position: sitting Prep: DuraPrep and site prepped and draped Patient monitoring: continuous pulse ox, blood pressure, heart rate and cardiac monitor Approach: midline Location: L3-L4 Injection technique: LOR air  Needle:  Needle type: Tuohy  Needle gauge: 17 G Needle length: 9 cm Needle insertion depth: 5 cm Catheter type: closed end flexible Catheter size: 19 Gauge Catheter at skin depth: 10 cm Test dose: negative  Assessment Sensory level: T8 Events: blood not aspirated, injection not painful, no injection resistance, no paresthesia and negative IV test  Additional Notes Patient identified. Risks/Benefits/Options discussed with patient including but not limited to bleeding, infection, nerve damage, paralysis, failed block, incomplete pain control, headache, blood pressure changes, nausea, vomiting, reactions to medication both or allergic, itching and postpartum back pain. Confirmed with bedside nurse the patient's most recent platelet count. Confirmed with patient that they are not currently taking any anticoagulation, have any bleeding history or any family history of bleeding disorders. Patient expressed understanding and wished to proceed. All questions were answered. Sterile technique was used throughout the entire procedure. Please see nursing notes for vital signs. Test dose was given through epidural catheter and negative prior to continuing to dose epidural or start infusion. Warning signs of high block given to the patient including shortness of breath, tingling/numbness in hands, complete motor block,  or any concerning symptoms with instructions to call for help. Patient was given instructions on fall risk and not to get out of bed. All questions and concerns addressed with instructions to call with any issues or inadequate analgesia.  Reason for block:procedure for pain

## 2020-10-15 NOTE — Progress Notes (Addendum)
Erin Dennis is a 40 y.o. G3P0020 at 39w1dby LMP admitted for induction of labor due to Hypertension.  Subjective: Patient endorsing painful contractions otherwise comfortable in bed  Objective: BP (!) 143/93   Pulse (!) 111   Temp 98.3 F (36.8 C) (Oral)   Resp 16   Ht '5\' 1"'$  (1.549 m)   Wt 70.1 kg   LMP 01/22/2020   SpO2 99%   BMI 29.21 kg/m  No intake/output data recorded. No intake/output data recorded.  FHT:  FHR: 140 bpm, variability: moderate,  accelerations:  Present,  decelerations:  Absent UC:   regular, every 2-5 minutes SVE:   Dilation: 4 Effacement (%): 80 Station: -1 Exam by:: Dr MBerniece Andreas Labs: Lab Results  Component Value Date   WBC 9.7 10/15/2020   HGB 12.6 10/15/2020   HCT 36.8 10/15/2020   MCV 94.1 10/15/2020   PLT 199 10/15/2020   Assessment / Plan: G3P0020 at 392w1dere for Induction of labor due to gestational hypertension   Labor: Progressing well on Pitocin, AROM at 0500 for clear fluid.  gHTN: PEC labs normal. BP elevated due to acute pain during cervical exam (SBP >160), not elevated since. Will continue to monitor.  Fetal Wellbeing:  Category I Pain Control: desires Epidural now  I/D:  GBS Neg Anticipated MOD:  NSVD  JaHermenia Bersuus 10/15/2020, 5:41 AM  I saw and evaluated the patient. I agree with the findings and the plan of care as documented in the resident's note.  JuSharene SkeansMD [facility]Se[facility]amily Medicine Fellow, [facility]CoGlen[facility]ing

## 2020-10-15 NOTE — Progress Notes (Signed)
RN assessed patients blood pressure during 1st 4-hour check and patients blood pressure was 157/91 on the left arm. Blood pressure was taken again on the right and was 155/88. Patient did not have a headache, epigastric pain, no significant swelling, or complaints of dizziness. Patient does have a history of GHTN. Dr. Rock Nephew was notified and she stated that patient will be started on Norvasc in the morning and that no frequent checks are needed at this time other than at her 4 hour checks. Will continue to monitor patient and blood pressure.

## 2020-10-15 NOTE — Progress Notes (Signed)
Erin Dennis is a 40 y.o. G3P0020 at 60w1dby LMP admitted for IOL-gHTN.  Subjective: Doing well without complaints.  Objective: BP 119/81   Pulse 86   Temp 98 F (36.7 C) (Oral)   Resp 18   Ht '5\' 1"'$  (1.549 m)   Wt 70.1 kg   LMP 01/22/2020   SpO2 99%   BMI 29.21 kg/m  No intake/output data recorded. No intake/output data recorded.  FHT:  FHR: 140 bpm, variability: moderate,  accelerations:  Present,  decelerations:  Present intermittent late and early decels with contractions UC:   regular, every 1-3 minutes SVE:   Dilation: 9 Effacement (%): 100 Station: 0 Exam by:: Erin Jersey RN  Labs: Lab Results  Component Value Date   WBC 9.7 10/15/2020   HGB 12.6 10/15/2020   HCT 36.8 10/15/2020   MCV 94.1 10/15/2020   PLT 199 10/15/2020   Assessment / Plan: G3P0020 at 362w1dere for Induction of labor due to gestational hypertension   IOL: Progressing well on Pitocin, AROM at 0500 for clear fluid.  gHTN: PEC labs normal. BP unremarkable. Asymptomatic. Will continue to monitor.  Fetal Wellbeing:  Category II, overall reassuring, due to rapid cervical change Pain Control: desires Epidural now  I/D:  GBS Neg Depression: SW consult pp, not on meds. A1GDM: EFW 3479g, 71%ile. BGL well controlled, continue q2 checks.  Erin Dennis Senate/[id number] AM

## 2020-10-15 NOTE — Progress Notes (Signed)
I was asked by staff to speak with MOB regarding visitation.  MOB reported that "I called both labor and delivery and mother-baby and was told that I could have 2 visitors the entire time."  I explained our visitation policy and offered apologies for incorrect information given.  MOB not happy with policy but agreed that second visitor would leave.  They asked for an hour for someone to arrive to pick up visitor.  Agricultural consultant and I agreed that an hour would be ok.  Explained grievance process and gave Office of Patient Experience card.   Corene Cornea BSN, Engineer, materials   Women's and [employer] [phone] [email or web address]

## 2020-10-15 NOTE — Discharge Summary (Addendum)
Postpartum Discharge Summary      Patient Name: Erin Dennis DOB: 26-Oct-1980 MRN: [id number]  Date of admission: 10/14/2020 Delivery date:10/15/2020  Delivering provider: Aletha Halim  Date of discharge: 10/16/2020  Admitting diagnosis: Encounter for induction of labor [Z34.90] Intrauterine pregnancy: [redacted]w[redacted]d    Secondary diagnosis:  Active Problems:   Depression   Supervision of high-risk pregnancy   AMA (advanced maternal age) primigravida 35+   Request for sterilization   Gestational diabetes   Darier-White disease   Gestational hypertension   Encounter for induction of labor   IUD (intrauterine device) in place   Forceps delivery with baby delivered   Second degree perineal laceration   Hematoma  Additional problems: none    Discharge diagnosis: Term Pregnancy Delivered, Gestational Hypertension and GDM A1                                              Post partum procedures: post placental liletta placed Augmentation: AROM and Pitocin Complications: None  Hospital course: Induction of Labor With Vaginal Delivery   40y.o. yo G3P0020 at 39w1das admitted to the hospital 10/14/2020 for induction of labor.  Indication for induction: Gestational hypertension.  IOL was initiated with pitocin and patient progressed appropriately. AROM was ultimately performed and patient progressed to complete. Patient with NRFHT while pushing so decision was made to proceed with forceps assisted delivery, please refer to delivery note for further details. Patient did have a second degree perineal laceration and right labial laceration, both repaired and hemostatic. She also was noted to have a right labial hematoma post delivery. Membrane Rupture Time/Date: 5:04 AM ,10/15/2020   Delivery Method:Vaginal, Forceps  Episiotomy: None  Lacerations:  2nd degree;Labial  Details of delivery can be found in separate delivery note. Patient was started on amlodipine 72m67mostpartum for elevated BP. She  was also monitored closely due to large R labial hematoma. She otherwise had a routine postpartum course. Patient is discharged home 10/16/20.  Newborn Data: Birth date:10/15/2020  Birth time:1:53 PM  Gender:Female  Living status:Living  Apgars:8 ,9  Weight:3.487 kg   Magnesium Sulfate received: No BMZ received: No Rhophylac:N/A MMR:N/A T-DaP:Given prenatally Flu: Yes Transfusion:No  Physical exam  Vitals:   10/15/20 2235 10/15/20 2237 10/16/20 0320 10/16/20 0635  BP: (!) 157/91 (!) 155/98 135/75 132/87  Pulse:  78 71 71  Resp:   16   Temp:  98.6 F (37 C) 97.9 F (36.6 C) 97.9 F (36.6 C)  TempSrc:  Oral Oral Oral  SpO2: 100% 100% 100% 99%  Weight:      Height:       General: alert, cooperative and no distress Lochia: appropriate Uterine Fundus: firm Incision: N/A Genitalia: large R labial hematoma; Pt assures me that it feels stable, just "swollen".  PRecautions given, to keep ice packs/pressure on it.  DVT Evaluation: No evidence of DVT seen on physical exam. Labs: Lab Results  Component Value Date   WBC 15.8 (H) 10/16/2020   HGB 10.5 (L) 10/16/2020   HCT 30.9 (L) 10/16/2020   MCV 96.0 10/16/2020   PLT 170 10/16/2020   CMP Latest Ref Rng & Units 10/14/2020  Glucose 70 - 99 mg/dL 97  BUN 6 - 20 mg/dL 8  Creatinine 0.44 - 1.00 mg/dL 0.74  Sodium 135 - 145 mmol/L 134(L)  Potassium 3.5 - 5.1 mmol/L 3.6  Chloride 98 - 111 mmol/L 104  CO2 22 - 32 mmol/L 21(L)  Calcium 8.9 - 10.3 mg/dL 9.2  Total Protein 6.5 - 8.1 g/dL 6.6  Total Bilirubin 0.3 - 1.2 mg/dL 0.7  Alkaline Phos 38 - 126 U/L 139(H)  AST 15 - 41 U/L 29  ALT 0 - 44 U/L 16   Edinburgh Score: Edinburgh Postnatal Depression Scale Screening Tool 10/15/2020  I have been able to laugh and see the funny side of things. 0  I have looked forward with enjoyment to things. 0  I have blamed myself unnecessarily when things went wrong. 0  I have been anxious or worried for no good reason. 0  I have felt scared  or panicky for no good reason. 0  Things have been getting on top of me. 0  I have been so unhappy that I have had difficulty sleeping. 0  I have felt sad or miserable. 0  I have been so unhappy that I have been crying. 0  The thought of harming myself has occurred to me. 0  Edinburgh Postnatal Depression Scale Total 0     After visit meds:  Allergies as of 10/16/2020       Reactions   Acetaminophen Anaphylaxis, Rash   5-alpha Reductase Inhibitors    Sulfamethoxazole-trimethoprim Other (See Comments)   Blisters   Tape Rash        Medication List     STOP taking these medications    Accu-Chek Guide Me w/Device Kit   Accu-Chek Guide test strip Generic drug: glucose blood   Accu-Chek Softclix Lancets lancets   Blood Pressure Monitor Misc       TAKE these medications    amLODipine 5 MG tablet Commonly known as: NORVASC Take 1 tablet (5 mg total) by mouth daily.   coconut oil Oil Apply 1 application topically as needed.   ibuprofen 600 MG tablet Commonly known as: ADVIL Take 1 tablet (600 mg total) by mouth every 6 (six) hours.   multivitamin-prenatal 27-0.8 MG Tabs tablet Take 1 tablet by mouth daily at 12 noon.         Discharge home in stable condition Infant Feeding: Breast Infant Disposition:home with mother Discharge instruction: per After Visit Summary and Postpartum booklet. Activity: Advance as tolerated. Pelvic rest for 6 weeks.  Diet: routine diet Future Appointments: Future Appointments  Date Time Provider [place]  10/22/2020  2:50 PM [facility] NURSE [facility]  11/26/2020  1:30 PM [name], CNM [facility]  12/10/2020  8:30 AM [facility] LAB [facility]   Follow up Visit:  Message sent to [facility] 10/15/20 by Sylvester Harder.  Please schedule this patient for a In person postpartum visit in 6 weeks with the following provider: Any provider. Additional Postpartum F/U:Postpartum Depression checkup, 2 hour GTT and BP  check 1 week  High risk pregnancy complicated by: GDM and HTN Delivery mode:  Vaginal, Forceps  Anticipated Birth Control:  PP IUD placed, string check at postpartum visit   10/16/2020 Christin Fudge, CNM

## 2020-10-16 ENCOUNTER — Other Ambulatory Visit: Payer: Self-pay | Admitting: Family Medicine

## 2020-10-16 LAB — GLUCOSE, CAPILLARY: Glucose-Capillary: 81 mg/dL (ref 70–99)

## 2020-10-16 LAB — CBC
HCT: 30.9 % — ABNORMAL LOW (ref 36.0–46.0)
Hemoglobin: 10.5 g/dL — ABNORMAL LOW (ref 12.0–15.0)
MCH: 32.6 pg (ref 26.0–34.0)
MCHC: 34 g/dL (ref 30.0–36.0)
MCV: 96 fL (ref 80.0–100.0)
Platelets: 170 10*3/uL (ref 150–400)
RBC: 3.22 MIL/uL — ABNORMAL LOW (ref 3.87–5.11)
RDW: 13.1 % (ref 11.5–15.5)
WBC: 15.8 10*3/uL — ABNORMAL HIGH (ref 4.0–10.5)
nRBC: 0 % (ref 0.0–0.2)

## 2020-10-16 MED ORDER — AMLODIPINE BESYLATE 5 MG PO TABS
5.0000 mg | ORAL_TABLET | Freq: Every day | ORAL | Status: DC
  Filled 2020-10-16: qty 1

## 2020-10-16 MED ORDER — AMLODIPINE BESYLATE 5 MG PO TABS: 5.0000 mg | ORAL_TABLET | Freq: Every day | ORAL | 0 refills | Status: DC

## 2020-10-16 MED ORDER — IBUPROFEN 600 MG PO TABS: 600.0000 mg | ORAL_TABLET | Freq: Four times a day (QID) | ORAL | Status: DC

## 2020-10-16 MED ORDER — COCONUT OIL OIL: 1.0000 "application " | TOPICAL_OIL | 0 refills | Status: DC | PRN

## 2020-10-16 NOTE — Anesthesia Postprocedure Evaluation (Signed)
Anesthesia Post Note  Patient: Environmental health practitioner  Procedure(s) Performed: AN AD HOC LABOR EPIDURAL     Patient location during evaluation: Mother Baby Anesthesia Type: Epidural Level of consciousness: awake and alert, oriented and patient cooperative Pain management: pain level controlled Vital Signs Assessment: post-procedure vital signs reviewed and stable Respiratory status: spontaneous breathing Cardiovascular status: stable Postop Assessment: no headache, epidural receding, patient able to bend at knees and no signs of nausea or vomiting Anesthetic complications: no Comments: Pt. States she is walking. Pain score 0.    No complications documented.  Last Vitals:  Vitals:   10/16/20 0320 10/16/20 0635  BP: 135/75 132/87  Pulse: 71 71  Resp: 16   Temp: 36.6 C 36.6 C  SpO2: 100% 99%    Last Pain:  Vitals:   10/16/20 0758  TempSrc:   PainSc: 0-No pain   Pain Goal: Patients Stated Pain Goal: 0 (10/15/20 2237)                 Rico Sheehan

## 2020-10-16 NOTE — Progress Notes (Signed)
RN called and spoke with Dr Astrid Drafts about pt being hesitant to take newly prescribed BP meds. Per pt, she knows her body and only had higher BP readings due to being in pain, and now that her pain is more controlled, her BP is under control. As of this afternoon, VS WNL with BP being 112/79.   Per Dr Astrid Drafts, pt is ok to not take BP medication then, check BP at home and keep 1 week BP follow-up appointment. RN is to stress Pre-E discharge instructions and when to call MD at home. Pt is also to take BP medications home in case BP does increase.   RN spoke with pt about these changes and voiced understanding and agreement with new plan of care.

## 2020-10-16 NOTE — Progress Notes (Signed)
RN notified provider due to the increase in size of lower right labial hematoma. The patients pain level increased. Provider responded to call and came to patients room to view the hematoma. Dr. Rock Nephew instructed RN to provide patient with ice glove to place on area and give patient '10mg'$  PRN oxycodone. Provider stated that she would round on her during her 0630 rounds and assess area again.

## 2020-10-16 NOTE — Social Work (Signed)
CSW received consult for hx of Anxiety and Depression.  CSW met with MOB to offer support and complete assessment.     CSW introduced self and role. CSW observed infant sleeping in bassinet, MGM in recliner and FOB on couch. MOB declined to have guests exit the room for the assessment. CSW informed MOB of reason for consult. MOB reported she has never had a diagnosis of anxiety or depression. MOB denies ever being treated for the diagnosis.  MOB stated she had a good pregnancy and she is currently doing well. MOB identified her mother and FOB as supports. MOB denies any current SI or HI.   CSW provided education regarding the baby blues period versus PPD and provided resources. CSW provided the New Mom Checklist and encouraged MOB to self evaluate and contact a medical professional if symptoms are noted at any time.  CSW provided review of Sudden Infant Death Syndrome (SIDS) precautions.  MOB reported she has all essentials for infant, including a packn'play. MOB has identified a pediatrician and denies any barriers. MOB denies having any additional needs at this time.   CSW identifies no further need for intervention and no barriers to discharge at this time.  Rylah Fukuda, LCSWA Clinical Social Work [facility] [phone]  

## 2020-10-19 ENCOUNTER — Ambulatory Visit (INDEPENDENT_AMBULATORY_CARE_PROVIDER_SITE_OTHER): Payer: Medicaid Other | Admitting: Obstetrics & Gynecology

## 2020-10-19 ENCOUNTER — Encounter: Payer: Self-pay | Admitting: Obstetrics & Gynecology

## 2020-10-19 ENCOUNTER — Other Ambulatory Visit: Payer: Self-pay

## 2020-10-19 VITALS — BP 151/93 | HR 72 | Ht 61.0 in | Wt 149.0 lb

## 2020-10-19 DIAGNOSIS — N9089 Other specified noninflammatory disorders of vulva and perineum: Secondary | ICD-10-CM

## 2020-10-19 NOTE — Progress Notes (Signed)
Follow up appointment for results  Chief Complaint  Patient presents with  . was told labia was ripped    + hematomia    Blood pressure (!) 151/93, pulse 72, height '5\' 1"'$  (1.549 m), weight 149 lb (67.6 kg), last menstrual period 01/22/2020, not currently breastfeeding.  Erin Dennis is in today because of increased pain with her bottom after delivery She had a vulvar hematoma post forcep delivery  On exam the day she has swelling of the right vaginal sidewall and right vulva but it is pretty soft not tense and the color of it suggest a resolving hematoma nothing active based on the way the tissue feels slowly resolving  She also has dependent bruising down her right buttock and crural fold but again the color would suggest that this is beginning to resolve and is not extending  Her blood pressure is elevated today and she had held off taking the amlodipine 5 but I told her to take the Norvasc 5 daily first dose when she gets home  She has an appointment scheduled for Thursday for blood pressure check but I told her to hold off till next week and I can reexamine her bottom as well as see how her blood pressure is doing  In the meantime she will do local care with lukewarm baths and ice and tight compressive underwear for leggings or bike type shorts titer that she can compress the area of the better to feel in the quicker it will resolve  He does not have hemorrhoids this is all related to the vaginal sidewall and vulvar hematoma    MEDS ordered this encounter: No orders of the defined types were placed in this encounter.   Orders for this encounter: No orders of the defined types were placed in this encounter.   Impression:   ICD-10-CM   1. Vulvar hematoma, resolving  N90.89      Plan: Take her amlodipine 5 as prescribed  Local care/ice  Follow Up: No follow-ups on file.     All questions were answered.  Past Medical History:  Diagnosis Date  . Anxiety   . Darier  disease   . Depression    following miscarriage  . Gestational diabetes   . Infection    UTI  . Ovarian cyst     Past Surgical History:  Procedure Laterality Date  . APPENDECTOMY    . CHOLECYSTECTOMY    . DILATION AND CURETTAGE OF UTERUS     for menorrhagia  . DILATION AND CURETTAGE OF UTERUS N/A 10/07/2019   Procedure: SUCTION DILATATION AND CURETTAGE;  Surgeon: [name], MD;  Location: [facility];  Service: Gynecology;  Laterality: N/A;  Dr. Request Time 9:30am  . OVARIAN CYST SURGERY     x3  . TONSILLECTOMY    . TONSILLECTOMY  2007  . WISDOM TOOTH EXTRACTION      OB History    Gravida  3   Para  1   Term  1   Preterm      AB  2   Living  1     SAB  1   IAB      Ectopic  1   Multiple      Live Births  1           Allergies  Allergen Reactions  . Acetaminophen Anaphylaxis and Rash  . 5-Alpha Reductase Inhibitors   . Sulfamethoxazole-Trimethoprim Other (See Comments)    Blisters  . Tape Rash  Social History   Socioeconomic History  . Marital status: Married    Spouse name: Not on file  . Number of children: Not on file  . Years of education: Not on file  . Highest education level: Not on file  Occupational History  . Not on file  Tobacco Use  . Smoking status: Never Smoker  . Smokeless tobacco: Never Used  Vaping Use  . Vaping Use: Never used  Substance and Sexual Activity  . Alcohol use: Not Currently  . Drug use: Not Currently  . Sexual activity: Not Currently    Birth control/protection: I.U.D.  Other Topics Concern  . Not on file  Social History Narrative  . Not on file   Social Determinants of Health   Financial Resource Strain: Low Risk   . Difficulty of Paying Living Expenses: Not very hard  Food Insecurity: No Food Insecurity  . Worried About Charity fundraiser in the Last Year: Never true  . Ran Out of Food in the Last Year: Never true  Transportation Needs: No Transportation Needs  . Lack of Transportation  (Medical): No  . Lack of Transportation (Non-Medical): No  Physical Activity: Sufficiently Active  . Days of Exercise per Week: 5 days  . Minutes of Exercise per Session: 30 min  Stress: No Stress Concern Present  . Feeling of Stress : Only a little  Social Connections: Moderately Integrated  . Frequency of Communication with Friends and Family: More than three times a week  . Frequency of Social Gatherings with Friends and Family: Twice a week  . Attends Religious Services: 1 to 4 times per year  . Active Member of Clubs or Organizations: No  . Attends Archivist Meetings: Never  . Marital Status: Married    Family History  Problem Relation Age of Onset  . Diabetes Mother   . Hypertension Mother   . Diabetes Father   . Stroke Father   . Cancer Maternal Grandmother        ovarian  . Jaundice Son

## 2020-10-21 ENCOUNTER — Other Ambulatory Visit: Payer: Medicaid Other

## 2020-10-21 ENCOUNTER — Encounter: Payer: Medicaid Other | Admitting: Obstetrics & Gynecology

## 2020-10-26 ENCOUNTER — Ambulatory Visit (INDEPENDENT_AMBULATORY_CARE_PROVIDER_SITE_OTHER): Payer: Medicaid Other | Admitting: Obstetrics & Gynecology

## 2020-10-26 ENCOUNTER — Encounter: Payer: Self-pay | Admitting: Obstetrics & Gynecology

## 2020-10-26 ENCOUNTER — Other Ambulatory Visit: Payer: Self-pay

## 2020-10-26 VITALS — BP 142/92 | HR 62 | Ht 61.0 in | Wt 149.0 lb

## 2020-10-26 DIAGNOSIS — N9089 Other specified noninflammatory disorders of vulva and perineum: Secondary | ICD-10-CM

## 2020-10-26 NOTE — Progress Notes (Signed)
Follow up appointment for results  Chief Complaint  Patient presents with  . Follow-up    BP    Blood pressure (!) 142/92, pulse 62, height '5\' 1"'$  (1.549 m), weight 149 lb (67.6 kg), last menstrual period 01/22/2020, not currently breastfeeding.  Erin Dennis is in today because of increased pain with her bottom after delivery She had a vulvar hematoma post forcep delivery  On exam the day she has swelling of the right vaginal sidewall and right vulva but it is pretty soft not tense and the color of it suggest a resolving hematoma nothing active based on the way the tissue feels slowly resolving  She also has dependent bruising down her right buttock and crural fold but again the color would suggest that this is beginning to resolve and is not extending  Her blood pressure is elevated today and she had held off taking the amlodipine 5 because it makes her dizzy    In the meantime she will do local care with lukewarm baths and ice and tight compressive underwear for leggings or bike type shorts titer that she can compress the area of the better to feel in the quicker it will resolve  He does not have hemorrhoids this is all related to the vaginal sidewall and vulvar hematoma    MEDS ordered this encounter: No orders of the defined types were placed in this encounter.   Orders for this encounter: No orders of the defined types were placed in this encounter.   Impression:   ICD-10-CM   1. Vulvar hematoma, now resolved  N90.89      Plan:   Local care/ice  Follow Up: Return in about 4 weeks (around 11/23/2020) for post partum visit.     All questions were answered.  Past Medical History:  Diagnosis Date  . Anxiety   . Darier disease   . Depression    following miscarriage  . Gestational diabetes   . Infection    UTI  . Ovarian cyst     Past Surgical History:  Procedure Laterality Date  . APPENDECTOMY    . CHOLECYSTECTOMY    . DILATION AND CURETTAGE OF UTERUS      for menorrhagia  . DILATION AND CURETTAGE OF UTERUS N/A 10/07/2019   Procedure: SUCTION DILATATION AND CURETTAGE;  Surgeon: [name], MD;  Location: [facility];  Service: Gynecology;  Laterality: N/A;  Dr. Request Time 9:30am  . OVARIAN CYST SURGERY     x3  . TONSILLECTOMY    . TONSILLECTOMY  2007  . WISDOM TOOTH EXTRACTION      OB History    Gravida  3   Para  1   Term  1   Preterm      AB  2   Living  1     SAB  1   IAB      Ectopic  1   Multiple      Live Births  1           Allergies  Allergen Reactions  . Acetaminophen Anaphylaxis and Rash  . 5-Alpha Reductase Inhibitors   . Sulfamethoxazole-Trimethoprim Other (See Comments)    Blisters  . Tape Rash    Social History   Socioeconomic History  . Marital status: Married    Spouse name: Not on file  . Number of children: Not on file  . Years of education: Not on file  . Highest education level: Not on file  Occupational History  .  Not on file  Tobacco Use  . Smoking status: Never Smoker  . Smokeless tobacco: Never Used  Vaping Use  . Vaping Use: Never used  Substance and Sexual Activity  . Alcohol use: Not Currently  . Drug use: Not Currently  . Sexual activity: Not Currently    Birth control/protection: I.U.D.  Other Topics Concern  . Not on file  Social History Narrative  . Not on file   Social Determinants of Health   Financial Resource Strain: Low Risk   . Difficulty of Paying Living Expenses: Not very hard  Food Insecurity: No Food Insecurity  . Worried About Charity fundraiser in the Last Year: Never true  . Ran Out of Food in the Last Year: Never true  Transportation Needs: No Transportation Needs  . Lack of Transportation (Medical): No  . Lack of Transportation (Non-Medical): No  Physical Activity: Sufficiently Active  . Days of Exercise per Week: 5 days  . Minutes of Exercise per Session: 30 min  Stress: No Stress Concern Present  . Feeling of Stress : Only a little   Social Connections: Moderately Integrated  . Frequency of Communication with Friends and Family: More than three times a week  . Frequency of Social Gatherings with Friends and Family: Twice a week  . Attends Religious Services: 1 to 4 times per year  . Active Member of Clubs or Organizations: No  . Attends Archivist Meetings: Never  . Marital Status: Married    Family History  Problem Relation Age of Onset  . Diabetes Mother   . Hypertension Mother   . Diabetes Father   . Stroke Father   . Cancer Maternal Grandmother        ovarian  . Jaundice Son

## 2020-11-11 ENCOUNTER — Telehealth: Payer: Self-pay | Admitting: Obstetrics & Gynecology

## 2020-11-11 NOTE — Telephone Encounter (Signed)
Pt called to see if Dr. Elonda Husky has had a cancellation for surgery She's scheduled for 12/09/2020 but would like it done before 12/09/2020 due to she's got vacation scheduled 2 weeks after her scheduled surgery  Please advise & notify pt

## 2020-11-26 ENCOUNTER — Other Ambulatory Visit: Payer: Self-pay

## 2020-11-26 ENCOUNTER — Encounter: Payer: Self-pay | Admitting: Women's Health

## 2020-11-26 ENCOUNTER — Ambulatory Visit (INDEPENDENT_AMBULATORY_CARE_PROVIDER_SITE_OTHER): Payer: Medicaid Other | Admitting: Women's Health

## 2020-11-26 DIAGNOSIS — O165 Unspecified maternal hypertension, complicating the puerperium: Secondary | ICD-10-CM

## 2020-11-26 DIAGNOSIS — Z8632 Personal history of gestational diabetes: Secondary | ICD-10-CM

## 2020-11-26 MED ORDER — HYDROCHLOROTHIAZIDE 25 MG PO TABS: 25.0000 mg | ORAL_TABLET | Freq: Every day | ORAL | 0 refills | Status: DC

## 2020-11-26 NOTE — Progress Notes (Signed)
POSTPARTUM VISIT Patient name: Erin Dennis MRN [id number]  Date of birth: 04/08/1981 Chief Complaint:   Postpartum Care (IUD check)  History of Present Illness:   Erin Dennis is a 40 y.o. G43P1021 Caucasian female being seen today for a postpartum visit. She is 6 weeks postpartum following a forceps, low vaginal delivery at 38.1 gestational weeks. IOL: yes, for gestational hypertension. Also had A1DM. Anesthesia: epidural.  Laceration: 2nd degree.  Complications: none. Inpatient contraception: yes pp Liletta inserted 10/15/20.   Pregnancy complicated by A2QJ, GHTN. Tobacco use: no. Substance use disorder: no. Last pap smear: 04/15/20 and results were NILM w/ HRHPV negative. Next pap smear due: 2024 Patient's last menstrual period was 01/22/2020.  Postpartum course has been complicated by PPHTN, d/c'd on norvasc 14m, stopped taking d/t dizziness. Had Rt labial hematoma, now completely resolved. Bleeding none. Bowel function is normal, however she is unable to control passing gas. Bladder function is normal. Urinary incontinence? no, fecal incontinence? no Patient is not sexually active. Last sexual activity: prior to birth of baby. Desired contraception: PP IUD placed and has salpingectomy scheduled for 5/12 w/ LHE. IUD is for period management. Patient does not want a pregnancy in the future.  Desired family size is 1 children.   Upstream - 11/26/20 1342      Pregnancy Intention Screening   Does the patient want to become pregnant in the next year? No    Does the patient's partner want to become pregnant in the next year? No    Would the patient like to discuss contraceptive options today? No      Contraception Wrap Up   Current Method IUD or IUS    End Method IUD or IUS    Contraception Counseling Provided No          The pregnancy intention screening data noted above was reviewed. Potential methods of contraception were discussed. The patient elected to proceed with IUD or IUS.    Edinburgh Postpartum Depression Screening: negative  Edinburgh Postnatal Depression Scale - 11/26/20 1343      Edinburgh Postnatal Depression Scale:  In the Past 7 Days   I have been able to laugh and see the funny side of things. 0    I have looked forward with enjoyment to things. 0    I have blamed myself unnecessarily when things went wrong. 0    I have been anxious or worried for no good reason. 0    I have felt scared or panicky for no good reason. 0    Things have been getting on top of me. 0    I have been so unhappy that I have had difficulty sleeping. 0    I have felt sad or miserable. 0    I have been so unhappy that I have been crying. 0    The thought of harming myself has occurred to me. 0    Edinburgh Postnatal Depression Scale Total 0          Baby's course has been uncomplicated. Baby is feeding by bottle. Infant has a pediatrician/family doctor? Yes.  Childcare strategy if returning to work/school: n/a-stay at home mom.  Pt has material needs met for her and baby: Yes.   Review of Systems:   Pertinent items are noted in HPI Denies Abnormal vaginal discharge w/ itching/odor/irritation, headaches, visual changes, shortness of breath, chest pain, abdominal pain, severe nausea/vomiting, or problems with urination or bowel movements. Pertinent History Reviewed:  Reviewed past medical,surgical, obstetrical  and family history.  Reviewed problem list, medications and allergies. OB History  Gravida Para Term Preterm AB Living  _0 SAB IAB Ectopic Multiple Live Births  _1 # Outcome Date GA Lbr Len/2nd Weight Sex Delivery Anes PTL Lv  3 Term 10/15/20 [redacted]w[redacted]d  M Vag-Spont   LIV  2 Ectopic 06/2018          1 SAB            Physical Assessment:   Vitals:   11/26/20 1339 11/26/20 1348  BP: (!) 158/91 (!) 157/97  Pulse: 70 71  Weight: 143 lb (64.9 kg)   Height: _2  (1.549 m)   Body mass index is 27.02 kg/m.       Physical Examination:    General appearance: alert, well appearing, and in no distress  Mental status: alert, oriented to person, place, and time  Skin: warm & dry   Cardiovascular: normal heart rate noted   Respiratory: normal respiratory effort, no distress   Breasts: deferred, no complaints   Abdomen: soft, non-tender   Pelvic: vulva/perineum normal, lac healing well. IUD strings coiled in cx, all I can see is a loop. Thin prep pap obtained: No  Rectal: no external hemorrhoids  Extremities: Edema: none  Informal TA u/s w/ Amber confirms IUD in correct place  Chaperone: JLevy Pupa        No results found for this or any previous visit (from the past 24 hour(s)).  Assessment & Plan:  1) Postpartum exam 2) 6 wks s/p forceps, low SVB after IOL for GHTN 3) bottle feeding 4) Depression screening 5) Contraception: s/p pp IUD placement for period management. Has salpingectomy scheduled for 5/11 w/ LHE 6) Resolved labial hematoma 7) PPHTN> rx hctz 283m will recheck bp at upcoming appts 8) GDM in pregnancy> GTT on 5/19  Essential components of care per ACOG recommendations:  1.  Mood and well being:  . If positive depression screen, discussed and plan developed.  . If using tobacco we discussed reduction/cessation and risk of relapse . If current substance abuse, we discussed and referral to local resources was offered.   2. Infant care and feeding:  . If breastfeeding, discussed returning to work, pumping, breastfeeding-associated pain, guidance regarding return to fertility while lactating if not using another method. If needed, patient was provided with a letter to be allowed to pump q 2-3hrs to support lactation in a private location with access to a refrigerator to store breastmilk.   . Recommended that all caregivers be immunized for flu, pertussis and other preventable communicable diseases . If pt does not have material needs met for her/baby, referred to local resources for help obtaining  these.  3. Sexuality, contraception and birth spacing . Provided guidance regarding sexuality, management of dyspareunia, and resumption of intercourse . Discussed avoiding interpregnancy interval <56m33m and recommended birth spacing of 18 months  4. Sleep and fatigue . Discussed coping options for fatigue and sleep disruption . Encouraged family/partner/community support of 4 hrs of uninterrupted sleep to help with mood and fatigue  5. Physical recovery  . If pt had a C/S, assessed incisional pain and providing guidance on normal vs prolonged recovery . If pt had a laceration, perineal healing and pain reviewed.  . If urinary or fecal incontinence, discussed management and referred to PT or uro/gyn if indicated  . Patient is safe to resume physical  activity. Discussed attainment of healthy weight.  6.  Chronic disease management . Discussed pregnancy complications if any, and their implications for future childbearing and long-term maternal health. . Review recommendations for prevention of recurrent pregnancy complications, such as 17 hydroxyprogesterone caproate to reduce risk for recurrent PTB not applicable, or aspirin to reduce risk of preeclampsia not applicable. . Pt had GDM: yes. If yes, 2hr GTT scheduled: yes. . Reviewed medications and non-pregnant dosing including consideration of whether pt is breastfeeding using a reliable resource such as LactMed: not applicable . Referred for f/u w/ PCP or subspecialist providers as indicated: not applicable  7. Health maintenance . Mammogram at 39yo or earlier if indicated . Pap smears as indicated  Meds:  Meds ordered this encounter  Medications  . hydrochlorothiazide (HYDRODIURIL) 25 MG tablet    Sig: Take 1 tablet (25 mg total) by mouth daily.    Dispense:  30 tablet    Refill:  0    Order Specific Question:   Supervising Provider    Answer:   [name] [[id number]]    Follow-up: Return for cancel 5/12 sugar test, reschedule  for 5/19.   No orders of the defined types were placed in this encounter.   [place], [facility] 11/26/2020 2:29 PM

## 2020-12-07 NOTE — Patient Instructions (Addendum)
Erin Dennis  12/07/2020     '@PREFPERIOPPHARMACY'$ @   Your procedure is scheduled on  12/09/2020   Report to [facility] at  [email or web address].   Call this number if you have problems the morning of surgery:  [phone]   Remember:  Do not eat after midnight. You may drink until 0700. At 0700 drink your carb drink and have nothing else after this.                   Take these medicines the morning of surgery with A SIP OF WATER   None   Place clean sheets on your bed the night before your procedure and DO NOT sleep with pets this night.  Shower with CHG the night before and the morning of your procedure. DO NOT use CHG on your face, hair or genitals.  After each shower, dry off with a clean towel, out on clean, comfortable clothes and brush your teeth.      Do not wear jewelry, make-up or nail polish.  Do not wear lotions, powders, or perfumes, or deodorant.  Do not shave 48 hours prior to surgery.  Men may shave face and neck.  Do not bring valuables to the hospital.  [facility] is not responsible for any belongings or valuables.  Contacts, dentures or bridgework may not be worn into surgery.  Leave your suitcase in the car.  After surgery it may be brought to your room.  For patients admitted to the hospital, discharge time will be determined by your treatment team.  Patients discharged the day of surgery will not be allowed to drive home and must have someone with them for 24 hours.   Special instructions:  DO NOT smoke tobacco or vape for 24 hours before your procedure.   Please read over the following fact sheets that you were given. Coughing and Deep Breathing, Surgical Site Infection Prevention, Anesthesia Post-op Instructions and Care and Recovery After Surgery       Salpingectomy, Care After This sheet gives you information about how to care for yourself after your procedure. Your health care provider may also give you more specific  instructions. If you have problems or questions, contact your health care provider. What can I expect after the procedure? After the procedure, it is common to have:  Pain in your abdomen.  Some light vaginal bleeding (spotting) for a few days.  Tiredness. Your recovery time will vary depending on which method your surgeon used for your surgery. Follow these instructions at home: Incision care  Follow instructions from your health care provider about how to take care of your incisions. Make sure you: ? Wash your hands with soap and water before and after you change your bandage (dressing). If soap and water are not available, use hand sanitizer. ? Change or remove your dressing as told by your health care provider. ? Leave any stitches (sutures), skin glue, or adhesive strips in place. These skin closures may need to stay in place for 2 weeks or longer. If adhesive strip edges start to loosen and curl up, you may trim the loose edges. Do not remove adhesive strips completely unless your health care provider tells you to do that.  Keep your dressing clean and dry.  Check your incision area every day for signs of infection. Check for: ? Redness, swelling, or pain that gets worse. ? Fluid or blood. ?  Warmth. ? Pus or a bad smell.   Activity  Rest as told by your health care provider.  Avoid sitting for a long time without moving. Get up to take short walks every 1-2 hours. This is important to improve blood flow and breathing. Ask for help if you feel weak or unsteady.  Return to your normal activities as told by your health care provider. Ask your health care provider what activities are safe for you.  Do not drive until your health care provider says that it is safe.  Do not lift anything that is heavier than 10 lb (4.5 kg), or the limit that you are told, until your health care provider says that it is safe. This may be 2-6 weeks depending on your surgery.  Until your health care  provider approves: ? Do not douche. ? Do not use tampons. ? Do not have sex. Medicines  Take over-the-counter and prescription medicines only as told by your health care provider.  Ask your health care provider if the medicine prescribed to you: ? Requires you to avoid driving or using heavy machinery. ? Can cause constipation. You may need to take actions to prevent or treat constipation, such as:  Drink enough fluid to keep your urine pale yellow.  Take over-the-counter or prescription medicines.  Eat foods that are high in fiber, such as beans, whole grains, and fresh fruits and vegetables.  Limit foods that are high in fat and processed sugars, such as fried or sweet foods. General instructions  Wear compression stockings as told by your health care provider. These stockings help to prevent blood clots and reduce swelling in your legs.  Do not use any products that contain nicotine or tobacco, such as cigarettes, e-cigarettes, and chewing tobacco. If you need help quitting, ask your health care provider.  Do not take baths, swim, or use a hot tub until your health care provider approves. You may take showers.  Keep all follow-up visits as told by your health care provider. This is important. Contact a health care provider if you have:  Pain when you urinate.  Redness, swelling, or pain around an incision.  Fluid or blood coming from an incision.  Pus or a bad smell coming from an incision.  An incision that feels warm to the touch.  A fever.  Abdominal pain that gets worse or does not get better with medicine.  An incision that starts to break open.  A rash.  Light-headedness.  Nausea and vomiting. Get help right away if you:  Have pain in your chest or leg.  Develop shortness of breath.  Faint.  Have increased or heavy vaginal bleeding, such as soaking a pad in an hour. Summary  After the procedure, it is common to feel tired, have some pain in your  abdomen, and have some light vaginal bleeding for a few days.  Follow instructions from your health care provider about how to take care of your incisions.  Return to your normal activities as told by your health care provider. Ask your health care provider what activities are safe for you.  Do not douche, use tampons, or have sex until your health care provider approves.  Keep all follow-up visits as told by your health care provider. This information is not intended to replace advice given to you by your health care provider. Make sure you discuss any questions you have with your health care provider. Document Revised: 07/09/2018 Document Reviewed: 07/09/2018 Elsevier Patient Education  [place].  Dilation and Curettage or Vacuum Curettage, Care After This sheet gives you information about how to care for yourself after your procedure. Your doctor may also give you more specific instructions. If you have problems or questions, contact your doctor. What can I expect after the procedure? After the procedure, it is common to have:  Mild pain or cramping.  Some bleeding or spotting from the vagina. These may last for up to 2 weeks. Follow these instructions at home: Medicines  Take over-the-counter and prescription medicines only as told by your doctor. This is very important if you take blood-thinning medicine.  Ask your doctor if the medicine prescribed to you requires you to avoid driving or using machinery. Activity  If you were given a medicine to help you relax (sedative) during your procedure, it can affect you for many hours. Do not drive or use machinery until your doctor says that it is safe.  Rest as told by your doctor.  Do not sit for a long time without moving. Get up to take short walks every 1-2 hours. This is important. Ask for help if you feel weak or unsteady.  Do not lift anything that is heavier than 10 lb (4.5 kg), or the limit that you are told,  until your doctor says that it is safe.  Return to your normal activities as told by your doctor. Ask your doctor what activities are safe for you.   Lifestyle For at least 2 weeks, or as long as told by your doctor:  Do not douche.  Do not use tampons.  Do not have sex. General instructions  Wear compression stockings as told by your doctor.  It is up to you to get the results of your procedure. Ask your doctor, or the department that is doing the procedure, when your results will be ready.  Keep all follow-up visits as told by your doctor. This is important. Contact a doctor if:  You have very bad cramps that get worse or do not get better with medicine.  You have very bad pain in your belly (abdomen).  You cannot drink fluids without vomiting.  You have pain in a different part of your pelvis. The pelvis is the area just above your thighs.  You have fluid from your vagina that smells bad.  You have a rash. Get help right away if:  You are bleeding a lot from your vagina. A lot of bleeding means soaking more than one sanitary pad in 1 hour for 2 hours in a row.  You have a fever that is above 100.57F (38.0C).  Your belly feels very tender or hard.  You have chest pain.  You have trouble breathing.  You feel dizzy.  You feel light-headed.  You pass out (faint).  You have pain in your neck or shoulder area. These symptoms may be an emergency. Do not wait to see if the symptoms will go away. Get medical help right away. Call your local emergency services (911 in the [place] Do not drive yourself to the hospital. Summary  After your procedure, it is common to have pain or cramping. It is also common to have bleeding or spotting from your vagina.  Rest as told. Do not sit for a long time without moving. Get up to take short walks every 1-2 hours.  Do not lift anything that is heavier than 10 lb (4.5 kg), or the limit that you are told.  Contact your doctor if  you have fluid from your vagina that smells bad.  Get help right away if you develop any problems from the procedure. Ask your doctor what problems to watch for. This information is not intended to replace advice given to you by your health care provider. Make sure you discuss any questions you have with your health care provider. Document Revised: 08/20/2019 Document Reviewed: 08/20/2019 Elsevier Patient Education  2021 [place].  Endometrial Ablation Endometrial ablation is a procedure that destroys the thin inner layer of the lining of the uterus (endometrium). This procedure may be done:  To stop heavy menstrual periods.  To stop bleeding that is causing anemia.  To control irregular bleeding.  To treat bleeding caused by small tumors (fibroids) in the endometrium. This procedure is often done as an alternative to major surgery, such as removal of the uterus and cervix (hysterectomy). As a result of this procedure:  You may not be able to have children. However, if you have not yet gone through menopause: ? You may still have a small chance of getting pregnant. ? You will need to use a reliable method of birth control after the procedure to prevent pregnancy.  You may stop having a menstrual period, or you may have only a small amount of bleeding during your period. Menstruation may return several years after the procedure. Tell a health care provider about:  Any allergies you have.  All medicines you are taking, including vitamins, herbs, eye drops, creams, and over-the-counter medicines.  Any problems you or family members have had with the use of anesthetic medicines.  Any blood disorders you have.  Any surgeries you have had.  Any medical conditions you have.  Whether you are pregnant or may be pregnant. What are the risks? Generally, this is a safe procedure. However, problems may occur, including:  A hole (perforation) in the uterus or bowel.  Infection in  the uterus, bladder, or vagina.  Bleeding.  Allergic reaction to medicines.  Damage to nearby structures or organs.  An air bubble in the lung (air embolus).  Problems with pregnancy.  Failure of the procedure.  Decreased ability to diagnose cancer in the endometrium. Scar tissue forms after the procedure, making it more difficult to get a sample of the uterine lining. What happens before the procedure? Medicines Ask your health care provider about:  Changing or stopping your regular medicines. This is especially important if you take diabetes medicines or blood thinners.  Taking medicines such as aspirin and ibuprofen. These medicines can thin your blood. Do not take these medicines before your procedure if your doctor tells you not to take them.  Taking over-the-counter medicines, vitamins, herbs, and supplements. Tests  You will have tests of your endometrium to make sure there are no precancerous cells or cancer cells present.  You may have an ultrasound of the uterus. General instructions  Do not use any products that contain nicotine or tobacco for at least 4 weeks before the procedure. These include cigarettes, chewing tobacco, and vaping devices, such as e-cigarettes. If you need help quitting, ask your health care provider.  You may be given medicines to thin the endometrium.  Ask your health care provider what steps will be taken to help prevent infection. These steps may include: ? Removing hair at the surgery site. ? Washing skin with a germ-killing soap. ? Taking antibiotic medicine.  Plan to have a responsible adult take you home from the hospital or clinic.  Plan to have a  responsible adult care for you for the time you are told after you leave the hospital or clinic. This is important. What happens during the procedure?  You will lie on an exam table with your feet and legs supported as in a pelvic exam.  An IV will be inserted into one of your  veins.  You will be given a medicine to help you relax (sedative).  A surgical tool with a light and camera (resectoscope) will be inserted into your vagina and moved into your uterus. This allows your surgeon to see inside your uterus.  Endometrial tissue will be destroyed and removed, using one of the following methods: ? Radiofrequency. This uses an electrical current to destroy the endometrium. ? Cryotherapy. This uses extreme cold to freeze the endometrium. ? Heated fluid. This uses a heated salt and water (saline) solution to destroy the endometrium. ? Microwave. This uses high-energy microwaves to heat up the endometrium and destroy it. ? Thermal balloon. This involves inserting a catheter with a balloon tip into the uterus. The balloon tip is filled with heated fluid to destroy the endometrium. The procedure may vary among health care providers and hospitals.   What happens after the procedure?  Your blood pressure, heart rate, breathing rate, and blood oxygen level will be monitored until you leave the hospital or clinic.  You may have vaginal bleeding for 4-6 weeks after the procedure. You may also have: ? Cramps. ? A thin, watery vaginal discharge that is light pink or brown. ? A need to urinate more than usual. ? Nausea.  If you were given a sedative during the procedure, it can affect you for several hours. Do not drive or operate machinery until your health care provider says that it is safe.  Do not have sex or insert anything into your vagina until your health care provider says it is safe. Summary  Endometrial ablation is done to treat many causes of heavy menstrual bleeding. The procedure destroys the thin inner layer of the lining of the uterus (endometrium).  This procedure is often done as an alternative to major surgery, such as removal of the uterus and cervix (hysterectomy).  Plan to have a responsible adult take you home from the hospital or clinic. This  information is not intended to replace advice given to you by your health care provider. Make sure you discuss any questions you have with your health care provider. Document Revised: 02/06/2020 Document Reviewed: 02/06/2020 Elsevier Patient Education  [place] Anesthesia, Adult, Care After This sheet gives you information about how to care for yourself after your procedure. Your health care provider may also give you more specific instructions. If you have problems or questions, contact your health care provider. What can I expect after the procedure? After the procedure, the following side effects are common:  Pain or discomfort at the IV site.  Nausea.  Vomiting.  Sore throat.  Trouble concentrating.  Feeling cold or chills.  Feeling weak or tired.  Sleepiness and fatigue.  Soreness and body aches. These side effects can affect parts of the body that were not involved in surgery. Follow these instructions at home: For the time period you were told by your health care provider:  Rest.  Do not participate in activities where you could fall or become injured.  Do not drive or use machinery.  Do not drink alcohol.  Do not take sleeping pills or medicines that cause drowsiness.  Do not make important  decisions or sign legal documents.  Do not take care of children on your own.   Eating and drinking  Follow any instructions from your health care provider about eating or drinking restrictions.  When you feel hungry, start by eating small amounts of foods that are soft and easy to digest (bland), such as toast. Gradually return to your regular diet.  Drink enough fluid to keep your urine pale yellow.  If you vomit, rehydrate by drinking water, juice, or clear broth. General instructions  If you have sleep apnea, surgery and certain medicines can increase your risk for breathing problems. Follow instructions from your health care provider about  wearing your sleep device: ? Anytime you are sleeping, including during daytime naps. ? While taking prescription pain medicines, sleeping medicines, or medicines that make you drowsy.  Have a responsible adult stay with you for the time you are told. It is important to have someone help care for you until you are awake and alert.  Return to your normal activities as told by your health care provider. Ask your health care provider what activities are safe for you.  Take over-the-counter and prescription medicines only as told by your health care provider.  If you smoke, do not smoke without supervision.  Keep all follow-up visits as told by your health care provider. This is important. Contact a health care provider if:  You have nausea or vomiting that does not get better with medicine.  You cannot eat or drink without vomiting.  You have pain that does not get better with medicine.  You are unable to pass urine.  You develop a skin rash.  You have a fever.  You have redness around your IV site that gets worse. Get help right away if:  You have difficulty breathing.  You have chest pain.  You have blood in your urine or stool, or you vomit blood. Summary  After the procedure, it is common to have a sore throat or nausea. It is also common to feel tired.  Have a responsible adult stay with you for the time you are told. It is important to have someone help care for you until you are awake and alert.  When you feel hungry, start by eating small amounts of foods that are soft and easy to digest (bland), such as toast. Gradually return to your regular diet.  Drink enough fluid to keep your urine pale yellow.  Return to your normal activities as told by your health care provider. Ask your health care provider what activities are safe for you. This information is not intended to replace advice given to you by your health care provider. Make sure you discuss any questions you  have with your health care provider. Document Revised: 04/02/2020 Document Reviewed: 10/31/2019 Elsevier Patient Education  2021 [employer].

## 2020-12-08 ENCOUNTER — Other Ambulatory Visit: Payer: Self-pay

## 2020-12-08 ENCOUNTER — Encounter (HOSPITAL_COMMUNITY)
Admission: RE | Admit: 2020-12-08 | Discharge: 2020-12-08 | Disposition: A | Discharge status: Home / Self Care | Payer: Medicaid Other | Source: Ambulatory Visit | Attending: Obstetrics & Gynecology | Admitting: Obstetrics & Gynecology

## 2020-12-08 ENCOUNTER — Other Ambulatory Visit (HOSPITAL_COMMUNITY)
Admission: RE | Admit: 2020-12-08 | Discharge: 2020-12-08 | Disposition: A | Discharge status: Home / Self Care | Payer: Medicaid Other | Source: Ambulatory Visit | Attending: Obstetrics & Gynecology | Admitting: Obstetrics & Gynecology

## 2020-12-08 ENCOUNTER — Other Ambulatory Visit: Payer: Self-pay | Admitting: Obstetrics & Gynecology

## 2020-12-08 ENCOUNTER — Encounter (HOSPITAL_COMMUNITY): Payer: Self-pay

## 2020-12-08 DIAGNOSIS — Z01818 Encounter for other preprocedural examination: Secondary | ICD-10-CM | POA: Diagnosis present

## 2020-12-08 DIAGNOSIS — Z20822 Contact with and (suspected) exposure to covid-19: Secondary | ICD-10-CM | POA: Diagnosis not present

## 2020-12-08 HISTORY — DX: Simple febrile convulsions: R56.00

## 2020-12-08 LAB — CBC
HCT: 41.3 % (ref 36.0–46.0)
Hemoglobin: 12.8 g/dL (ref 12.0–15.0)
MCH: 29.8 pg (ref 26.0–34.0)
MCHC: 31 g/dL (ref 30.0–36.0)
MCV: 96.3 fL (ref 80.0–100.0)
Platelets: 286 10*3/uL (ref 150–400)
RBC: 4.29 MIL/uL (ref 3.87–5.11)
RDW: 12 % (ref 11.5–15.5)
WBC: 5.7 10*3/uL (ref 4.0–10.5)
nRBC: 0 % (ref 0.0–0.2)

## 2020-12-08 LAB — URINALYSIS, ROUTINE W REFLEX MICROSCOPIC
Bilirubin Urine: NEGATIVE
Glucose, UA: NEGATIVE mg/dL
Ketones, ur: NEGATIVE mg/dL
Nitrite: NEGATIVE
Protein, ur: 30 mg/dL — AB
Specific Gravity, Urine: 1.024 (ref 1.005–1.030)
pH: 5 (ref 5.0–8.0)

## 2020-12-08 LAB — COMPREHENSIVE METABOLIC PANEL
ALT: 27 U/L (ref 0–44)
AST: 22 U/L (ref 15–41)
Albumin: 3.8 g/dL (ref 3.5–5.0)
Alkaline Phosphatase: 77 U/L (ref 38–126)
Anion gap: 6 (ref 5–15)
BUN: 15 mg/dL (ref 6–20)
CO2: 25 mmol/L (ref 22–32)
Calcium: 8.5 mg/dL — ABNORMAL LOW (ref 8.9–10.3)
Chloride: 106 mmol/L (ref 98–111)
Creatinine, Ser: 0.76 mg/dL (ref 0.44–1.00)
GFR, Estimated: >60 mL/min (ref >60)
Glucose, Bld: 81 mg/dL (ref 70–99)
Potassium: 4 mmol/L (ref 3.5–5.1)
Sodium: 137 mmol/L (ref 135–145)
Total Bilirubin: 0.7 mg/dL (ref 0.3–1.2)
Total Protein: 6.8 g/dL (ref 6.5–8.1)

## 2020-12-08 LAB — HCG, QUANTITATIVE, PREGNANCY: hCG, Beta Chain, Quant, S: <1 m[IU]/mL (ref <5)

## 2020-12-08 LAB — SARS CORONAVIRUS 2 (TAT 6-24 HRS): SARS Coronavirus 2: NEGATIVE

## 2020-12-09 ENCOUNTER — Encounter (HOSPITAL_COMMUNITY): Payer: Self-pay | Admitting: Obstetrics & Gynecology

## 2020-12-09 ENCOUNTER — Ambulatory Visit (HOSPITAL_COMMUNITY)
Admission: RE | Admit: 2020-12-09 | Discharge: 2020-12-09 | Disposition: A | Discharge status: Home / Self Care | Payer: Medicaid Other | Attending: Obstetrics & Gynecology | Admitting: Obstetrics & Gynecology

## 2020-12-09 ENCOUNTER — Encounter (HOSPITAL_COMMUNITY)
Admission: RE | Disposition: A | Discharge status: Home / Self Care | Payer: Self-pay | Source: Home / Self Care | Attending: Obstetrics & Gynecology

## 2020-12-09 ENCOUNTER — Ambulatory Visit (HOSPITAL_COMMUNITY): Payer: Medicaid Other | Admitting: Anesthesiology

## 2020-12-09 DIAGNOSIS — Z302 Encounter for sterilization: Secondary | ICD-10-CM | POA: Diagnosis not present

## 2020-12-09 DIAGNOSIS — Z881 Allergy status to other antibiotic agents status: Secondary | ICD-10-CM | POA: Insufficient documentation

## 2020-12-09 DIAGNOSIS — O9089 Other complications of the puerperium, not elsewhere classified: Secondary | ICD-10-CM | POA: Diagnosis not present

## 2020-12-09 DIAGNOSIS — N920 Excessive and frequent menstruation with regular cycle: Secondary | ICD-10-CM | POA: Diagnosis not present

## 2020-12-09 DIAGNOSIS — N946 Dysmenorrhea, unspecified: Secondary | ICD-10-CM

## 2020-12-09 DIAGNOSIS — Z888 Allergy status to other drugs, medicaments and biological substances status: Secondary | ICD-10-CM | POA: Diagnosis not present

## 2020-12-09 HISTORY — PX: DILATATION AND CURETTAGE/HYSTEROSCOPY WITH MINERVA: SHX6851

## 2020-12-09 HISTORY — PX: LAPAROSCOPIC BILATERAL SALPINGECTOMY: SHX5889

## 2020-12-09 SURGERY — SALPINGECTOMY, BILATERAL, LAPAROSCOPIC
Anesthesia: General | Site: Abdomen

## 2020-12-09 MED ORDER — MIDAZOLAM HCL 2 MG/2ML IJ SOLN
INTRAMUSCULAR | Status: AC
  Filled 2020-12-09: qty 2

## 2020-12-09 MED ORDER — BUPIVACAINE LIPOSOME 1.3 % IJ SUSP
INTRAMUSCULAR | Status: AC
  Filled 2020-12-09: qty 20

## 2020-12-09 MED ORDER — LIDOCAINE HCL (CARDIAC) PF 100 MG/5ML IV SOSY
PREFILLED_SYRINGE | INTRAVENOUS | Status: DC | PRN
  Administered 2020-12-09: 60 mg via INTRATRACHEAL

## 2020-12-09 MED ORDER — MEPERIDINE HCL 50 MG/ML IJ SOLN: 6.2500 mg | INTRAMUSCULAR | Status: DC | PRN

## 2020-12-09 MED ORDER — OXYCODONE HCL 5 MG PO TABS
ORAL_TABLET | ORAL | Status: AC
  Filled 2020-12-09: qty 1

## 2020-12-09 MED ORDER — OXYCODONE HCL 5 MG PO TABS
5.0000 mg | ORAL_TABLET | Freq: Once | ORAL | Status: AC
Start: 2020-12-09 — End: 2020-12-09
  Administered 2020-12-09: 5 mg via ORAL

## 2020-12-09 MED ORDER — PROPOFOL 10 MG/ML IV BOLUS
INTRAVENOUS | Status: AC
  Filled 2020-12-09: qty 40

## 2020-12-09 MED ORDER — ORAL CARE MOUTH RINSE: 15.0000 mL | Freq: Once | OROMUCOSAL | Status: AC

## 2020-12-09 MED ORDER — HYDROCODONE-IBUPROFEN 5-200 MG PO TABS: 2.0000 | ORAL_TABLET | ORAL | 0 refills | Status: DC | PRN

## 2020-12-09 MED ORDER — ROCURONIUM BROMIDE 10 MG/ML (PF) SYRINGE
PREFILLED_SYRINGE | INTRAVENOUS | Status: DC | PRN
  Administered 2020-12-09: 10 mg via INTRAVENOUS
  Administered 2020-12-09: 50 mg via INTRAVENOUS

## 2020-12-09 MED ORDER — SODIUM CHLORIDE 0.9 % IR SOLN
Status: DC | PRN
  Administered 2020-12-09: 1000 mL

## 2020-12-09 MED ORDER — ONDANSETRON HCL 4 MG/2ML IJ SOLN
INTRAMUSCULAR | Status: DC | PRN
  Administered 2020-12-09: 4 mg via INTRAVENOUS

## 2020-12-09 MED ORDER — SUGAMMADEX SODIUM 500 MG/5ML IV SOLN
INTRAVENOUS | Status: DC | PRN
  Administered 2020-12-09: 150 mg via INTRAVENOUS

## 2020-12-09 MED ORDER — CEFAZOLIN SODIUM-DEXTROSE 2-4 GM/100ML-% IV SOLN
2.0000 g | INTRAVENOUS | Status: AC
  Administered 2020-12-09: 2 g via INTRAVENOUS
  Filled 2020-12-09: qty 100

## 2020-12-09 MED ORDER — MIDAZOLAM HCL 2 MG/2ML IJ SOLN
INTRAMUSCULAR | Status: DC | PRN
  Administered 2020-12-09: 2 mg via INTRAVENOUS

## 2020-12-09 MED ORDER — LIDOCAINE HCL (PF) 2 % IJ SOLN
INTRAMUSCULAR | Status: AC
  Filled 2020-12-09: qty 5

## 2020-12-09 MED ORDER — DEXAMETHASONE SODIUM PHOSPHATE 10 MG/ML IJ SOLN
INTRAMUSCULAR | Status: DC | PRN
  Administered 2020-12-09: 5 mg via INTRAVENOUS

## 2020-12-09 MED ORDER — CHLORHEXIDINE GLUCONATE 0.12 % MT SOLN
15.0000 mL | Freq: Once | OROMUCOSAL | Status: AC
  Administered 2020-12-09: 15 mL via OROMUCOSAL

## 2020-12-09 MED ORDER — FENTANYL CITRATE (PF) 100 MCG/2ML IJ SOLN
INTRAMUSCULAR | Status: DC | PRN
  Administered 2020-12-09: 50 ug via INTRAVENOUS
  Administered 2020-12-09: 100 ug via INTRAVENOUS
  Administered 2020-12-09 (×2): 25 ug via INTRAVENOUS

## 2020-12-09 MED ORDER — KETOROLAC TROMETHAMINE 10 MG PO TABS: 10.0000 mg | ORAL_TABLET | Freq: Three times a day (TID) | ORAL | 0 refills | Status: DC | PRN

## 2020-12-09 MED ORDER — KETOROLAC TROMETHAMINE 30 MG/ML IJ SOLN
30.0000 mg | Freq: Once | INTRAMUSCULAR | Status: AC
  Administered 2020-12-09: 30 mg via INTRAVENOUS
  Filled 2020-12-09: qty 1

## 2020-12-09 MED ORDER — POVIDONE-IODINE 10 % EX SWAB: 2.0000 "application " | Freq: Once | CUTANEOUS | Status: DC

## 2020-12-09 MED ORDER — ONDANSETRON HCL 4 MG/2ML IJ SOLN
4.0000 mg | Freq: Once | INTRAMUSCULAR | Status: DC | PRN
Start: 2020-12-09 — End: 2020-12-09

## 2020-12-09 MED ORDER — BUPIVACAINE LIPOSOME 1.3 % IJ SUSP
20.0000 mL | Freq: Once | INTRAMUSCULAR | Status: DC
  Filled 2020-12-09: qty 20

## 2020-12-09 MED ORDER — HYDROMORPHONE HCL 1 MG/ML IJ SOLN: 0.2500 mg | INTRAMUSCULAR | Status: DC | PRN

## 2020-12-09 MED ORDER — 0.9 % SODIUM CHLORIDE (POUR BTL) OPTIME
TOPICAL | Status: DC | PRN
  Administered 2020-12-09: 1000 mL

## 2020-12-09 MED ORDER — ONDANSETRON HCL 4 MG/2ML IJ SOLN
INTRAMUSCULAR | Status: AC
  Filled 2020-12-09: qty 2

## 2020-12-09 MED ORDER — FENTANYL CITRATE (PF) 250 MCG/5ML IJ SOLN
INTRAMUSCULAR | Status: AC
  Filled 2020-12-09: qty 5

## 2020-12-09 MED ORDER — ROCURONIUM BROMIDE 10 MG/ML (PF) SYRINGE
PREFILLED_SYRINGE | INTRAVENOUS | Status: AC
  Filled 2020-12-09: qty 10

## 2020-12-09 MED ORDER — PROPOFOL 10 MG/ML IV BOLUS
INTRAVENOUS | Status: DC | PRN
  Administered 2020-12-09: 150 mg via INTRAVENOUS

## 2020-12-09 MED ORDER — BUPIVACAINE LIPOSOME 1.3 % IJ SUSP
INTRAMUSCULAR | Status: DC | PRN
  Administered 2020-12-09: 20 mL

## 2020-12-09 MED ORDER — LACTATED RINGERS IV SOLN
INTRAVENOUS | Status: DC
  Administered 2020-12-09: 1000 mL via INTRAVENOUS

## 2020-12-09 MED ORDER — ONDANSETRON 8 MG PO TBDP: 8.0000 mg | ORAL_TABLET | Freq: Three times a day (TID) | ORAL | 0 refills | Status: DC | PRN

## 2020-12-09 SURGICAL SUPPLY — 54 items
ADH SKN CLS APL DERMABOND .7 (GAUZE/BANDAGES/DRESSINGS) ×2
APPLIER CLIP 5 13 M/L LIGAMAX5 (MISCELLANEOUS)
APR CLP MED LRG 5 ANG JAW (MISCELLANEOUS)
BAG HAMPER (MISCELLANEOUS) ×3 IMPLANT
BLADE SURG SZ11 CARB STEEL (BLADE) ×3 IMPLANT
CLIP APPLIE 5 13 M/L LIGAMAX5 (MISCELLANEOUS) IMPLANT
CLOTH BEACON ORANGE TIMEOUT ST (SAFETY) ×3 IMPLANT
COVER LIGHT HANDLE STERIS (MISCELLANEOUS) ×12 IMPLANT
COVER WAND RF STERILE (DRAPES) ×3 IMPLANT
DERMABOND ADVANCED (GAUZE/BANDAGES/DRESSINGS) ×1
DERMABOND ADVANCED .7 DNX12 (GAUZE/BANDAGES/DRESSINGS) ×2 IMPLANT
ELECT REM PT RETURN 9FT ADLT (ELECTROSURGICAL) ×3
ELECTRODE REM PT RTRN 9FT ADLT (ELECTROSURGICAL) ×2 IMPLANT
GAUZE 4X4 16PLY RFD (DISPOSABLE) ×6 IMPLANT
GLOVE ECLIPSE 8.0 STRL XLNG CF (GLOVE) ×9 IMPLANT
GLOVE SRG 8 PF TXTR STRL LF DI (GLOVE) ×2 IMPLANT
GLOVE SURG ENC TEXT LTX SZ6.5 (GLOVE) ×3 IMPLANT
GLOVE SURG UNDER POLY LF SZ7 (GLOVE) ×9 IMPLANT
GLOVE SURG UNDER POLY LF SZ8 (GLOVE) ×3
GOWN STRL REUS W/TWL LRG LVL3 (GOWN DISPOSABLE) ×6 IMPLANT
GOWN STRL REUS W/TWL XL LVL3 (GOWN DISPOSABLE) ×6 IMPLANT
HANDPIECE ABLA MINERVA ENDO (MISCELLANEOUS) ×3 IMPLANT
INST SET HYSTEROSCOPY (KITS) ×3 IMPLANT
INST SET LAPROSCOPIC GYN AP (KITS) ×3 IMPLANT
IV NS 1000ML (IV SOLUTION) ×3
IV NS 1000ML BAXH (IV SOLUTION) ×2 IMPLANT
KIT TURNOVER CYSTO (KITS) ×3 IMPLANT
MANIFOLD NEPTUNE II (INSTRUMENTS) ×3 IMPLANT
MARKER SKIN DUAL TIP RULER LAB (MISCELLANEOUS) ×3 IMPLANT
NEEDLE HYPO 18GX1.5 BLUNT FILL (NEEDLE) ×3 IMPLANT
NEEDLE HYPO 21X1.5 SAFETY (NEEDLE) ×3 IMPLANT
NEEDLE INSUFFLATION 14GA 120MM (NEEDLE) ×3 IMPLANT
NS IRRIG 1000ML POUR BTL (IV SOLUTION) ×3 IMPLANT
PACK BASIC III (CUSTOM PROCEDURE TRAY) ×3
PACK PERI GYN (CUSTOM PROCEDURE TRAY) ×3 IMPLANT
PACK SRG BSC III STRL LF ECLPS (CUSTOM PROCEDURE TRAY) ×2 IMPLANT
PAD ARMBOARD 7.5X6 YLW CONV (MISCELLANEOUS) ×3 IMPLANT
PAD TELFA 3X4 1S STER (GAUZE/BANDAGES/DRESSINGS) ×3 IMPLANT
SET BASIN LINEN APH (SET/KITS/TRAYS/PACK) ×3 IMPLANT
SET CYSTO W/LG BORE CLAMP LF (SET/KITS/TRAYS/PACK) ×3 IMPLANT
SET TUBE SMOKE EVAC HIGH FLOW (TUBING) ×3 IMPLANT
SHEARS HARMONIC ACE PLUS 36CM (ENDOMECHANICALS) ×3 IMPLANT
SHEET LAVH (DRAPES) ×3 IMPLANT
SLEEVE ENDOPATH XCEL 5M (ENDOMECHANICALS) ×3 IMPLANT
SOL ANTI FOG 6CC (MISCELLANEOUS) ×2 IMPLANT
SOLUTION ANTI FOG 6CC (MISCELLANEOUS) ×1
SUT VICRYL 0 UR6 27IN ABS (SUTURE) ×3 IMPLANT
SUT VICRYL AB 3-0 FS1 BRD 27IN (SUTURE) ×6 IMPLANT
SYR 10ML LL (SYRINGE) ×3 IMPLANT
SYR 20ML LL LF (SYRINGE) ×6 IMPLANT
TROCAR ENDO BLADELESS 11MM (ENDOMECHANICALS) ×3 IMPLANT
TROCAR XCEL NON-BLD 5MMX100MML (ENDOMECHANICALS) ×3 IMPLANT
WARMER LAPAROSCOPE (MISCELLANEOUS) ×3 IMPLANT
YANKAUER SUCT BULB TIP 10FT TU (MISCELLANEOUS) ×3 IMPLANT

## 2020-12-09 NOTE — Discharge Instructions (Signed)
Endometrial Ablation Endometrial ablation is a procedure that destroys the thin inner layer of the lining of the uterus (endometrium). This procedure may be done:  To stop heavy menstrual periods.  To stop bleeding that is causing anemia.  To control irregular bleeding.  To treat bleeding caused by small tumors (fibroids) in the endometrium. This procedure is often done as an alternative to major surgery, such as removal of the uterus and cervix (hysterectomy). As a result of this procedure:  You may not be able to have children. However, if you have not yet gone through menopause: ? You may still have a small chance of getting pregnant. ? You will need to use a reliable method of birth control after the procedure to prevent pregnancy.  You may stop having a menstrual period, or you may have only a small amount of bleeding during your period. Menstruation may return several years after the procedure. Tell a health care provider about:  Any allergies you have.  All medicines you are taking, including vitamins, herbs, eye drops, creams, and over-the-counter medicines.  Any problems you or family members have had with the use of anesthetic medicines.  Any blood disorders you have.  Any surgeries you have had.  Any medical conditions you have.  Whether you are pregnant or may be pregnant. What are the risks? Generally, this is a safe procedure. However, problems may occur, including:  A hole (perforation) in the uterus or bowel.  Infection in the uterus, bladder, or vagina.  Bleeding.  Allergic reaction to medicines.  Damage to nearby structures or organs.  An air bubble in the lung (air embolus).  Problems with pregnancy.  Failure of the procedure.  Decreased ability to diagnose cancer in the endometrium. Scar tissue forms after the procedure, making it more difficult to get a sample of the uterine lining. What happens before the procedure? Medicines Ask your  health care provider about:  Changing or stopping your regular medicines. This is especially important if you take diabetes medicines or blood thinners.  Taking medicines such as aspirin and ibuprofen. These medicines can thin your blood. Do not take these medicines before your procedure if your doctor tells you not to take them.  Taking over-the-counter medicines, vitamins, herbs, and supplements. Tests  You will have tests of your endometrium to make sure there are no precancerous cells or cancer cells present.  You may have an ultrasound of the uterus. General instructions  Do not use any products that contain nicotine or tobacco for at least 4 weeks before the procedure. These include cigarettes, chewing tobacco, and vaping devices, such as e-cigarettes. If you need help quitting, ask your health care provider.  You may be given medicines to thin the endometrium.  Ask your health care provider what steps will be taken to help prevent infection. These steps may include: ? Removing hair at the surgery site. ? Washing skin with a germ-killing soap. ? Taking antibiotic medicine.  Plan to have a responsible adult take you home from the hospital or clinic.  Plan to have a responsible adult care for you for the time you are told after you leave the hospital or clinic. This is important. What happens during the procedure?  You will lie on an exam table with your feet and legs supported as in a pelvic exam.  An IV will be inserted into one of your veins.  You will be given a medicine to help you relax (sedative).  A surgical tool with  a light and camera (resectoscope) will be inserted into your vagina and moved into your uterus. This allows your surgeon to see inside your uterus.  Endometrial tissue will be destroyed and removed, using one of the following methods: ? Radiofrequency. This uses an electrical current to destroy the endometrium. ? Cryotherapy. This uses extreme cold to  freeze the endometrium. ? Heated fluid. This uses a heated salt and water (saline) solution to destroy the endometrium. ? Microwave. This uses high-energy microwaves to heat up the endometrium and destroy it. ? Thermal balloon. This involves inserting a catheter with a balloon tip into the uterus. The balloon tip is filled with heated fluid to destroy the endometrium. The procedure may vary among health care providers and hospitals.   What happens after the procedure?  Your blood pressure, heart rate, breathing rate, and blood oxygen level will be monitored until you leave the hospital or clinic.  You may have vaginal bleeding for 4-6 weeks after the procedure. You may also have: ? Cramps. ? A thin, watery vaginal discharge that is light pink or brown. ? A need to urinate more than usual. ? Nausea.  If you were given a sedative during the procedure, it can affect you for several hours. Do not drive or operate machinery until your health care provider says that it is safe.  Do not have sex or insert anything into your vagina until your health care provider says it is safe. Summary  Endometrial ablation is done to treat many causes of heavy menstrual bleeding. The procedure destroys the thin inner layer of the lining of the uterus (endometrium).  This procedure is often done as an alternative to major surgery, such as removal of the uterus and cervix (hysterectomy).  Plan to have a responsible adult take you home from the hospital or clinic. This information is not intended to replace advice given to you by your health care provider. Make sure you discuss any questions you have with your health care provider. Document Revised: 02/06/2020 Document Reviewed: 02/06/2020 Elsevier Patient Education  2021 [place]. Laparoscopic Tubal Ligation, Care After The following information offers guidance on how to care for yourself after your procedure. Your health care provider may also give you  more specific instructions. If you have problems or questions, contact your health care provider. What can I expect after the procedure? After the procedure, it is common to have:  A sore throat if general anesthesia was used.  Pain in shoulders, back, and abdomen. This is caused by the gas that was used during the procedure.  Mild discomfort or cramping in your abdomen.  Pain or soreness in the area where the surgical incision was made.  A bloated feeling.  Tiredness.  Nausea and vomiting. Follow these instructions at home: Medicines  Take over-the-counter and prescription medicines only as told by your health care provider.  Ask your health care provider if the medicine prescribed to you: ? Requires you to avoid driving or using heavy machinery. ? Can cause constipation. You may need to take these actions to prevent or treat constipation:  Drink enough fluid to keep your urine pale yellow.  Take over-the-counter or prescription medicines.  Eat foods that are high in fiber, such as beans, whole grains, and fresh fruits and vegetables.  Limit foods that are high in fat and processed sugars, such as fried or sweet foods.  Do not take aspirin because it can cause bleeding. Incision care  Follow instructions from your health care  provider about how to take care of your incision. Make sure you: ? Wash your hands with soap and water for at least 20 seconds before and after you change your bandage (dressing). If soap and water are not available, use hand sanitizer. ? Change your dressing as told by your health care provider. ? Leave stitches (sutures), skin glue, or adhesive strips in place. These skin closures may need to stay in place for 2 weeks or longer. If adhesive strip edges start to loosen and curl up, you may trim the loose edges. Do not remove adhesive strips completely unless your health care provider tells you to do that.  Check your incision area every day for signs  of infection. Check for: ? Redness, swelling, or more pain. ? Fluid or blood. ? Warmth. ? Pus or a bad smell.      Activity  Rest as told by your health care provider.  Avoid sitting for a long time without moving. Get up to take short walks every 1-2 hours. This is important to improve blood flow and breathing. Ask for help if you feel weak or unsteady.  Do not have sex, douche, or put a tampon or anything else in your vagina for 6 weeks or as long as told by your health care provider.  Do not lift anything that is heavier than your baby for 2 weeks, or the limit that you are told, until your health care provider says that it is safe.  Do not take baths, swim, or use a hot tub until your health care provider approves. Ask your health care provider if you may take showers. You may only be allowed to take sponge baths.  Return to your normal activities as told by your health care provider. Ask your health care provider what activities are safe for you. General instructions  After the procedure you may need to wear a sanitary pad for vaginal discharge.  Have someone help you with your daily household tasks for the first few days.  Keep all follow-up visits. This is important. Contact a health care provider if:  You have redness, swelling, or more pain around your incision.  Your incision feels warm to the touch.  You have pus or a bad smell coming from your incision.  The edges of your incision break open after the sutures have been removed.  Your pain does not improve after 2-3 days.  You have a rash.  You repeatedly become dizzy or light-headed.  Your pain medicine is not helping. Get help right away if:  You have a fever or chills.  You faint.  You have increasing pain in your abdomen.  You have severe pain in one or both of your shoulders.  You have fluid or blood coming from your sutures or heavy bleeding from your vagina.  You have shortness of breath or  difficulty breathing.  You have chest pain, leg pain, or leg swelling.  You have ongoing nausea, vomiting, or diarrhea. These symptoms may represent a serious problem that is an emergency. Do not wait to see if the symptoms will go away. Get medical help right away. Call your local emergency services (911 in the [place] Do not drive yourself to the hospital. Summary  After the procedure, it is common to have mild discomfort or cramping in your abdomen.  After the procedure you may need to wear a sanitary pad for vaginal discharge.  Take over-the-counter and prescription medicines only as told by your health care  provider.  Watch for symptoms that should prompt you to call your health care provider.  Keep all follow-up visits. This is important. This information is not intended to replace advice given to you by your health care provider. Make sure you discuss any questions you have with your health care provider. Document Revised: 04/03/2020 Document Reviewed: 04/03/2020 Elsevier Patient Education  2021 [place] Anesthesia, Adult, Care After This sheet gives you information about how to care for yourself after your procedure. Your health care provider may also give you more specific instructions. If you have problems or questions, contact your health care provider. What can I expect after the procedure? After the procedure, the following side effects are common:  Pain or discomfort at the IV site.  Nausea.  Vomiting.  Sore throat.  Trouble concentrating.  Feeling cold or chills.  Feeling weak or tired.  Sleepiness and fatigue.  Soreness and body aches. These side effects can affect parts of the body that were not involved in surgery. Follow these instructions at home: For the time period you were told by your health care provider:  Rest.  Do not participate in activities where you could fall or become injured.  Do not drive or use machinery.  Do  not drink alcohol.  Do not take sleeping pills or medicines that cause drowsiness.  Do not make important decisions or sign legal documents.  Do not take care of children on your own.   Eating and drinking  Follow any instructions from your health care provider about eating or drinking restrictions.  When you feel hungry, start by eating small amounts of foods that are soft and easy to digest (bland), such as toast. Gradually return to your regular diet.  Drink enough fluid to keep your urine pale yellow.  If you vomit, rehydrate by drinking water, juice, or clear broth. General instructions  If you have sleep apnea, surgery and certain medicines can increase your risk for breathing problems. Follow instructions from your health care provider about wearing your sleep device: ? Anytime you are sleeping, including during daytime naps. ? While taking prescription pain medicines, sleeping medicines, or medicines that make you drowsy.  Have a responsible adult stay with you for the time you are told. It is important to have someone help care for you until you are awake and alert.  Return to your normal activities as told by your health care provider. Ask your health care provider what activities are safe for you.  Take over-the-counter and prescription medicines only as told by your health care provider.  If you smoke, do not smoke without supervision.  Keep all follow-up visits as told by your health care provider. This is important. Contact a health care provider if:  You have nausea or vomiting that does not get better with medicine.  You cannot eat or drink without vomiting.  You have pain that does not get better with medicine.  You are unable to pass urine.  You develop a skin rash.  You have a fever.  You have redness around your IV site that gets worse. Get help right away if:  You have difficulty breathing.  You have chest pain.  You have blood in your urine or  stool, or you vomit blood. Summary  After the procedure, it is common to have a sore throat or nausea. It is also common to feel tired.  Have a responsible adult stay with you for the time you  are told. It is important to have someone help care for you until you are awake and alert.  When you feel hungry, start by eating small amounts of foods that are soft and easy to digest (bland), such as toast. Gradually return to your regular diet.  Drink enough fluid to keep your urine pale yellow.  Return to your normal activities as told by your health care provider. Ask your health care provider what activities are safe for you. This information is not intended to replace advice given to you by your health care provider. Make sure you discuss any questions you have with your health care provider. Document Revised: 04/02/2020 Document Reviewed: 10/31/2019 Elsevier Patient Education  [place] UNTIL Sunday Dec 13, 2020. DO NOT USE ANY NUMBING MEDICATIONS WITHOUT CONSULTING A PHYSICIAN UNTIL AFTER Sunday     Bupivacaine Liposomal Suspension for Injection What is this medicine? BUPIVACAINE LIPOSOMAL (bue PIV a kane LIP oh som al) is an anesthetic. It causes loss of feeling in the skin or other tissues. It is used to prevent and to treat pain from some procedures. This medicine may be used for other purposes; ask your health care provider or pharmacist if you have questions. COMMON BRAND NAME(S): EXPAREL What should I tell my health care provider before I take this medicine? They need to know if you have any of these conditions:  G6PD deficiency  heart disease  kidney disease  liver disease  low blood pressure  lung or breathing disease, like asthma  an unusual or allergic reaction to bupivacaine, other medicines, foods, dyes, or preservatives  pregnant or trying to get pregnant  breast-feeding How should I use this medicine? This  medicine is injected into the affected area. It is given by a health care provider in a hospital or clinic setting. Talk to your health care provider about the use of this medicine in children. While it may be given to children as young as 6 years for selected conditions, precautions do apply. Overdosage: If you think you have taken too much of this medicine contact a poison control center or emergency room at once. NOTE: This medicine is only for you. Do not share this medicine with others. What if I miss a dose? This does not apply. What may interact with this medicine? This medicine may interact with the following medications:  acetaminophen  certain antibiotics like dapsone, nitrofurantoin, aminosalicylic acid, sulfonamides  certain medicines for seizures like phenobarbital, phenytoin, valproic acid  chloroquine  cyclophosphamide  flutamide  hydroxyurea  ifosfamide  metoclopramide  nitric oxide  nitroglycerin  nitroprusside  nitrous oxide  other local anesthetics like lidocaine, pramoxine, tetracaine  primaquine  quinine  rasburicase  sulfasalazine This list may not describe all possible interactions. Give your health care provider a list of all the medicines, herbs, non-prescription drugs, or dietary supplements you use. Also tell them if you smoke, drink alcohol, or use illegal drugs. Some items may interact with your medicine. What should I watch for while using this medicine? Your condition will be monitored carefully while you are receiving this medicine. Be careful to avoid injury while the area is numb, and you are not aware of pain. What side effects may I notice from receiving this medicine? Side effects that you should report to your doctor or health care professional as soon as possible:  allergic reactions like skin rash, itching or hives, swelling of the face, lips, or tongue  seizures  signs and symptoms of a dangerous change in heartbeat or  heart rhythm like chest pain; dizziness; fast, irregular heartbeat; palpitations; feeling faint or lightheaded; falls; breathing problems  signs and symptoms of methemoglobinemia such as pale, gray, or blue colored skin; headache; fast heartbeat; shortness of breath; feeling faint or lightheaded, falls; tiredness Side effects that usually do not require medical attention (report to your doctor or health care professional if they continue or are bothersome):  anxious  back pain  changes in taste  changes in vision  constipation  dizziness  fever  nausea, vomiting This list may not describe all possible side effects. Call your doctor for medical advice about side effects. You may report side effects to FDA at 1-800-FDA-1088. Where should I keep my medicine? This drug is given in a hospital or clinic and will not be stored at home. NOTE: This sheet is a summary. It may not cover all possible information. If you have questions about this medicine, talk to your doctor, pharmacist, or health care provider.  2021 Elsevier/Gold Standard (2019-10-24 12:24:57)

## 2020-12-09 NOTE — Progress Notes (Signed)
Dr. Charna Elizabeth in to evaluate patient.

## 2020-12-09 NOTE — Anesthesia Procedure Notes (Signed)
Procedure Name: Intubation Date/Time: 12/09/2020 12:08 PM Performed by: Vista Deck, CRNA Pre-anesthesia Checklist: Patient identified, Patient being monitored, Timeout performed, Emergency Drugs available and Suction available Patient Re-evaluated:Patient Re-evaluated prior to induction Oxygen Delivery Method: Circle System Utilized Preoxygenation: Pre-oxygenation with 100% oxygen Induction Type: IV induction Ventilation: Mask ventilation without difficulty Laryngoscope Size: Mac and 3 Grade View: Grade I Tube type: Oral Tube size: 7.0 mm Number of attempts: 1 Airway Equipment and Method: stylet Placement Confirmation: ETT inserted through vocal cords under direct vision,  positive ETCO2 and breath sounds checked- equal and bilateral Secured at: 22 cm Tube secured with: Tape Dental Injury: Teeth and Oropharynx as per pre-operative assessment

## 2020-12-09 NOTE — Progress Notes (Signed)
Patient states "I bumped my left elbow about two weeks ago and been doctoring on it myself." On exam, patient with slight bruise to left elbow, hard area noted, patient advised to follow up with her primary doctor.

## 2020-12-09 NOTE — Progress Notes (Signed)
Patient verbalizes "I probably won't even fill the Toradol filled, but I am ok to take a pain pill when I get home"

## 2020-12-09 NOTE — Progress Notes (Signed)
Patient states "I will take a pain pill before I go home, Dr. Charna Elizabeth present with patient"

## 2020-12-09 NOTE — Anesthesia Preprocedure Evaluation (Signed)
Anesthesia Evaluation  Patient identified by MRN, date of birth, ID band Patient awake    Reviewed: Allergy & Precautions, NPO status , Patient's Chart, lab work & pertinent test results  Airway        Dental  (+) Dental Advisory Given   Pulmonary neg pulmonary ROS,    breath sounds clear to auscultation       Cardiovascular Exercise Tolerance: Good hypertension (not taking meds), Normal cardiovascular exam Rhythm:Regular Rate:Normal     Neuro/Psych Seizures - (febrile seizures),  PSYCHIATRIC DISORDERS Anxiety Depression    GI/Hepatic negative GI ROS, Neg liver ROS,   Endo/Other  diabetes (gestational )  Renal/GU negative Renal ROS     Musculoskeletal negative musculoskeletal ROS (+)   Abdominal   Peds  Hematology negative hematology ROS (+)   Anesthesia Other Findings Darier syndrome   Reproductive/Obstetrics negative OB ROS                             Anesthesia Physical Anesthesia Plan  ASA: II  Anesthesia Plan: General   Post-op Pain Management:    Induction: Intravenous  PONV Risk Score and Plan: Ondansetron, Dexamethasone and Midazolam  Airway Management Planned: Oral ETT  Additional Equipment:   Intra-op Plan:   Post-operative Plan: Extubation in OR  Informed Consent: I have reviewed the patients History and Physical, chart, labs and discussed the procedure including the risks, benefits and alternatives for the proposed anesthesia with the patient or authorized representative who has indicated his/her understanding and acceptance.     Dental advisory given  Plan Discussed with: CRNA and Surgeon  Anesthesia Plan Comments:         Anesthesia Quick Evaluation

## 2020-12-09 NOTE — Op Note (Signed)
Preoperative diagnosis:  1.   menorrhagia                                         2.  dysmenorrhea   Postoperative diagnoses: Same as above   Procedure: Hysteroscopy, uterine curettage, failed endometrial ablation using Minerva, removal/replacement IUD  Surgeon: [name]   Anesthesia: Laryngeal mask airway  Findings: The endometrium was normal. There were no fibroid or other abnormalities.  Description of operation: The patient was taken to the operating room and placed in the supine position. She underwent general anesthesia using the laryngeal mask airway. She was placed in the dorsal lithotomy position and prepped and draped in the usual sterile fashion. A Graves speculum was placed and the anterior cervical lip was grasped with a single-tooth tenaculum. The cervix was dilated serially to allow passage of the hysteroscope. Diagnostic hysteroscopy was performed and was found to be normal. A vigorous uterine curettage was then performed and all tissue sent to pathology for evaluation.  I then proceeded to perform the Minerva endometrial ablation.   The uterus sounded to 9 cm The handpiece was attached to the Minerva power source/machine and the handpiece passed the checklist. The array was squeezed down to remove all of the air present.  The array was then place into the endometrial cavity and deployed to a length of 6 cm. The handpiece confirmed appropriate width by being in the green portion of the visual dial. The cervical cuff was then inflated to the point the CO2 indicator was in the green. The endometrial integrity check was then performed and integrity sequence was but was declined x 3 The first check passed each time and the second didn't which I assume may be related to her postpartum state As a result the heating cycle could not be carried out      All of the equipment worked well throughout the procedure.  The patient was awakened from anesthesia and taken to the  recovery room in good stable condition all counts were correct. She received 2 g of Ancef and 30 mg of Toradol preoperatively. She will be discharged from the recovery room and followed up in the office in 1- 2 weeks.    [name], MD  12/09/2020 1:30 PM   Preoperative Diagnosis:  1.  Multiparous female desires permanent sterilization                                          2.  Elects to have bilateral salpingectomy for ovarian cancer prophylaxis  Postoperative Diagnosis:  Same as above  Procedure:  Laparoscopic Bilateral Salpingectomy for the purpose of permanent sterilization  Surgeon:  Jacelyn Grip MD  Anaesthesia: general  Findings:  Patient had normal pelvic anatomy and no intraperitoneal abnormalities.  Description of Operation:  Patient was taken to the OR and placed into supine position where she underwent general anaesthesia.   She was placed in the dorsal lithotomy position and prepped and draped in the usual sterile fashion.   An incision was made in the umbilicus and dissection taken down to the rectus fascia. A Veres needle was used to insufflate the periotneal cavity. An 11 mm non bladed video laparoscope trocar was then placed under direct visualization without difficulty.  The above noted findings were observed.   Two additional 5 mm non bladed trocars were placed in the right and left lower quadrants under direct visualization without difficulty.   The Harmonic scalpel was employed and a salpingectomy of both the right and left tubes was performed.   The tubes were removed from the peritoneal cavity and sent to pathology.   There was good hemostasis bilaterally.   The fascia, peritoneum and subcutaneous tissue were closed using 0 vicryl.   All 3 skin incisions were closed using 3-0 vicryl in a subcuticular fashion.  Exparel 266 mg 20 cc was injected in the 3 incisional/trocar sites.  The patient was awakened from anaesthesia and taken to the PACU with all  counts being correct x 3.   The patient received  2 gram of ancef andToradol 30 mg IV preoperatively.  Erin Dennis 12/09/2020 1:32 PM

## 2020-12-09 NOTE — Progress Notes (Signed)
Patient states " I have pain across my shoulder blades since I woke up from anesthesia, pain a "5" like when you sleep wrong".  Dr. Charna Elizabeth notified.

## 2020-12-09 NOTE — OR Nursing (Signed)
Dr. Elonda Husky removed patient's IUD at beginning of procedure and put it back in at the end of the procedure.

## 2020-12-09 NOTE — H&P (Signed)
Preoperative History and Physical  Erin Dennis is a 40 y.o. [id number] with No LMP recorded. (Menstrual status: IUD). admitted for a hysteroscopy uterine curettage Minerva endometrial ablation due to menorrhagia and dysmenorrhea, lifelong, and laparoscopic bilateral salpingectomy for the purpose of sterilization.    PMH:    Past Medical History:  Diagnosis Date  . Anxiety   . Darier disease   . Depression    following miscarriage  . Febrile seizure ([place])   . Gestational diabetes    gestational  . Infection    UTI  . Ovarian cyst     PSH:     Past Surgical History:  Procedure Laterality Date  . APPENDECTOMY    . CHOLECYSTECTOMY    . DILATION AND CURETTAGE OF UTERUS     for menorrhagia  . DILATION AND CURETTAGE OF UTERUS N/A 10/07/2019   Procedure: SUCTION DILATATION AND CURETTAGE;  Surgeon: [name], MD;  Location: [facility];  Service: Gynecology;  Laterality: N/A;  Dr. Request Time 9:30am  . OVARIAN CYST SURGERY     x3  . TONSILLECTOMY    . TONSILLECTOMY  2007  . WISDOM TOOTH EXTRACTION      POb/GynH:      OB History    Gravida  3   Para  1   Term  1   Preterm      AB  2   Living  1     SAB  1   IAB      Ectopic  1   Multiple      Live Births  1           SH:   Social History   Tobacco Use  . Smoking status: Never Smoker  . Smokeless tobacco: Never Used  Vaping Use  . Vaping Use: Never used  Substance Use Topics  . Alcohol use: Not Currently  . Drug use: Not Currently    FH:    Family History  Problem Relation Age of Onset  . Diabetes Mother   . Hypertension Mother   . Diabetes Father   . Stroke Father   . Cancer Maternal Grandmother        ovarian  . Jaundice Son      Allergies:  Allergies  Allergen Reactions  . Acetaminophen Anaphylaxis and Rash  . 5-Alpha Reductase Inhibitors   . Sulfamethoxazole-Trimethoprim Other (See Comments)    Blisters  . Tape Rash    Medications:       Current Facility-Administered  Medications:  .  bupivacaine liposome (EXPAREL) 1.3 % injection 266 mg, 20 mL, Infiltration, Once, Unique Searfoss H, MD .  ceFAZolin (ANCEF) IVPB 2g/100 mL premix, 2 g, Intravenous, On Call to OR, [name], MD .  lactated ringers infusion, , Intravenous, Continuous, Battula, Rajamani C, MD, Last Rate: 50 mL/hr at 12/09/20 1008, 1,000 mL at 12/09/20 1008 .  povidone-iodine 10 % swab 2 application, 2 application, Topical, Once, Cherl Gorney, Mertie Clause, MD  Review of Systems:   Review of Systems  Constitutional: Negative for fever, chills, weight loss, malaise/fatigue and diaphoresis.  HENT: Negative for hearing loss, ear pain, nosebleeds, congestion, sore throat, neck pain, tinnitus and ear discharge.   Eyes: Negative for blurred vision, double vision, photophobia, pain, discharge and redness.  Respiratory: Negative for cough, hemoptysis, sputum production, shortness of breath, wheezing and stridor.   Cardiovascular: Negative for chest pain, palpitations, orthopnea, claudication, leg swelling and PND.  Gastrointestinal: Positive for abdominal pain. Negative for heartburn,  nausea, vomiting, diarrhea, constipation, blood in stool and melena.  Genitourinary: Negative for dysuria, urgency, frequency, hematuria and flank pain.  Musculoskeletal: Negative for myalgias, back pain, joint pain and falls.  Skin: Negative for itching and rash.  Neurological: Negative for dizziness, tingling, tremors, sensory change, speech change, focal weakness, seizures, loss of consciousness, weakness and headaches.  Endo/Heme/Allergies: Negative for environmental allergies and polydipsia. Does not bruise/bleed easily.  Psychiatric/Behavioral: Negative for depression, suicidal ideas, hallucinations, memory loss and substance abuse. The patient is not nervous/anxious and does not have insomnia.      PHYSICAL EXAM:  Blood pressure 129/83, pulse 62, temperature 97.8 F (36.6 C), temperature source Oral, resp. rate 14, SpO2  100 %, not currently breastfeeding.    Vitals reviewed. Constitutional: She is oriented to person, place, and time. She appears well-developed and well-nourished.  HENT:  Head: Normocephalic and atraumatic.  Right Ear: External ear normal.  Left Ear: External ear normal.  Nose: Nose normal.  Mouth/Throat: Oropharynx is clear and moist.  Eyes: Conjunctivae and EOM are normal. Pupils are equal, round, and reactive to light. Right eye exhibits no discharge. Left eye exhibits no discharge. No scleral icterus.  Neck: Normal range of motion. Neck supple. No tracheal deviation present. No thyromegaly present.  Cardiovascular: Normal rate, regular rhythm, normal heart sounds and intact distal pulses.  Exam reveals no gallop and no friction rub.   No murmur heard. Respiratory: Effort normal and breath sounds normal. No respiratory distress. She has no wheezes. She has no rales. She exhibits no tenderness.  GI: Soft. Bowel sounds are normal. She exhibits no distension and no mass. There is tenderness. There is no rebound and no guarding.  Genitourinary:       Vulva is normal without lesions Vagina is pink moist without discharge Cervix normal in appearance and pap is normal Uterus is normal size, contour, position, consistency, mobility, non-tender Adnexa is negative with normal sized ovaries by sonogram  Musculoskeletal: Normal range of motion. She exhibits no edema and no tenderness.  Neurological: She is alert and oriented to person, place, and time. She has normal reflexes. She displays normal reflexes. No cranial nerve deficit. She exhibits normal muscle tone. Coordination normal.  Skin: Skin is warm and dry. No rash noted. No erythema. No pallor.  Psychiatric: She has a normal mood and affect. Her behavior is normal. Judgment and thought content normal.    Labs: Results for orders placed or performed during the hospital encounter of 12/08/20 (from the past 336 hour(s))  SARS CORONAVIRUS 2  (TAT 6-24 HRS) Nasopharyngeal Nasopharyngeal Swab   Collection Time: 12/08/20  9:16 AM   Specimen: Nasopharyngeal Swab  Result Value Ref Range   SARS Coronavirus 2 NEGATIVE NEGATIVE  Urinalysis, Routine w reflex microscopic Urine, Clean Catch   Collection Time: 12/08/20 10:21 AM  Result Value Ref Range   Color, Urine YELLOW YELLOW   APPearance HAZY (A) CLEAR   Specific Gravity, Urine 1.024 1.005 - 1.030   pH 5.0 5.0 - 8.0   Glucose, UA NEGATIVE NEGATIVE mg/dL   Hgb urine dipstick LARGE (A) NEGATIVE   Bilirubin Urine NEGATIVE NEGATIVE   Ketones, ur NEGATIVE NEGATIVE mg/dL   Protein, ur 30 (A) NEGATIVE mg/dL   Nitrite NEGATIVE NEGATIVE   Leukocytes,Ua LARGE (A) NEGATIVE   RBC / HPF 0-5 0 - 5 RBC/hpf   WBC, UA 21-50 0 - 5 WBC/hpf   Bacteria, UA RARE (A) NONE SEEN   Squamous Epithelial / LPF 11-20 0 - 5  Mucus PRESENT    Uric Acid Crys, UA PRESENT   CBC   Collection Time: 12/08/20 10:23 AM  Result Value Ref Range   WBC 5.7 4.0 - 10.5 K/uL   RBC 4.29 3.87 - 5.11 MIL/uL   Hemoglobin 12.8 12.0 - 15.0 g/dL   HCT 41.3 36.0 - 46.0 %   MCV 96.3 80.0 - 100.0 fL   MCH 29.8 26.0 - 34.0 pg   MCHC 31.0 30.0 - 36.0 g/dL   RDW 12.0 11.5 - 15.5 %   Platelets 286 150 - 400 K/uL   nRBC 0.0 0.0 - 0.2 %  Comprehensive metabolic panel   Collection Time: 12/08/20 10:23 AM  Result Value Ref Range   Sodium 137 135 - 145 mmol/L   Potassium 4.0 3.5 - 5.1 mmol/L   Chloride 106 98 - 111 mmol/L   CO2 25 22 - 32 mmol/L   Glucose, Bld 81 70 - 99 mg/dL   BUN 15 6 - 20 mg/dL   Creatinine, Ser 0.76 0.44 - 1.00 mg/dL   Calcium 8.5 (L) 8.9 - 10.3 mg/dL   Total Protein 6.8 6.5 - 8.1 g/dL   Albumin 3.8 3.5 - 5.0 g/dL   AST 22 15 - 41 U/L   ALT 27 0 - 44 U/L   Alkaline Phosphatase 77 38 - 126 U/L   Total Bilirubin 0.7 0.3 - 1.2 mg/dL   GFR, Estimated >60 >60 mL/min   Anion gap 6 5 - 15  hCG, quantitative, pregnancy   Collection Time: 12/08/20 10:24 AM  Result Value Ref Range   hCG, Beta Chain,  Quant, S <1 <5 mIU/mL    EKG: Orders placed or performed during the hospital encounter of 12/08/20  . EKG 12-Lead  . EKG 12-Lead    Imaging Studies: No results found.    Assessment: Menorrhagia Dysmenorrhea Parous female desires permanent sterilization  Patient Active Problem List   Diagnosis Date Noted  . Postpartum hypertension 11/26/2020  . IUD (intrauterine device) in place 10/15/2020  . Forceps delivery with baby delivered 10/15/2020  . Hematoma 10/15/2020  . History of gestational hypertension 10/14/2020  . 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 History of gestational diabetes 07/30/2020  . Request for sterilization 06/03/2020  . Depression 09/16/2008    Plan: Hysteroscopy uterine curettage Minerva endometrial ablation Laparoscopic bilateral salpingectomy for sterilization  [name] 12/09/2020 11:29 AM

## 2020-12-09 NOTE — Transfer of Care (Signed)
Immediate Anesthesia Transfer of Care Note  Patient: Erin Dennis  Procedure(s) Performed: LAPAROSCOPIC BILATERAL SALPINGECTOMY (Bilateral Abdomen) DILATATION AND CURETTAGE/HYSTEROSCOPY WITH ATTEMPTED  MINERVA  ENDOMETRIAL ABLATION (N/A )  Patient Location: PACU  Anesthesia Type:General  Level of Consciousness: awake  Airway & Oxygen Therapy: Patient Spontanous Breathing and Patient connected to nasal cannula oxygen  Post-op Assessment: Report given to RN and Post -op Vital signs reviewed and stable  Post vital signs: Reviewed and stable  Last Vitals:  Vitals Value Taken Time  BP 136/78   Temp    Pulse 64 12/09/20 1335  Resp 13 12/09/20 1335  SpO2 99 % 12/09/20 1335  Vitals shown include unvalidated device data.  Last Pain:  Vitals:   12/09/20 0953  TempSrc: Oral  PainSc: 0-No pain      Patients Stated Pain Goal: 5 [id number] [phone])  Complications: No complications documented.

## 2020-12-09 NOTE — Anesthesia Postprocedure Evaluation (Addendum)
Anesthesia Post Note  Patient: Environmental health practitioner  Procedure(s) Performed: LAPAROSCOPIC BILATERAL SALPINGECTOMY (Bilateral Abdomen) DILATATION AND CURETTAGE/HYSTEROSCOPY WITH ATTEMPTED  MINERVA  ENDOMETRIAL ABLATION (N/A )  Patient location during evaluation: Phase II Anesthesia Type: General Level of consciousness: awake and alert and oriented Pain management: pain level controlled Vital Signs Assessment: post-procedure vital signs reviewed and stable Respiratory status: spontaneous breathing Cardiovascular status: blood pressure returned to baseline and stable Postop Assessment: no apparent nausea or vomiting Anesthetic complications: no Comments: Patient pain in her back of neck and shoulders, no neurological deficits, reassured the patient and oxycodone '5mg'$  was given.    No complications documented.   Last Vitals:  Vitals:   12/09/20 1415 12/09/20 1439  BP: (!) 135/93 (!) 147/90  Pulse: (!) 57 (!) 58  Resp: 12 16  Temp:  36.5 C  SpO2: 99% 98%    Last Pain:  Vitals:   12/09/20 1439  TempSrc: Oral  PainSc: 5                  Ezra Denne C Maebry Obrien

## 2020-12-10 ENCOUNTER — Encounter (HOSPITAL_COMMUNITY): Payer: Self-pay | Admitting: Obstetrics & Gynecology

## 2020-12-10 ENCOUNTER — Other Ambulatory Visit: Payer: Medicaid Other

## 2020-12-11 LAB — SURGICAL PATHOLOGY

## 2020-12-17 ENCOUNTER — Ambulatory Visit (INDEPENDENT_AMBULATORY_CARE_PROVIDER_SITE_OTHER): Payer: Medicaid Other | Admitting: Obstetrics & Gynecology

## 2020-12-17 ENCOUNTER — Other Ambulatory Visit: Payer: Self-pay

## 2020-12-17 ENCOUNTER — Encounter: Payer: Self-pay | Admitting: Obstetrics & Gynecology

## 2020-12-17 ENCOUNTER — Other Ambulatory Visit: Payer: Medicaid Other

## 2020-12-17 VITALS — BP 142/89 | HR 63 | Ht 61.0 in | Wt 144.0 lb

## 2020-12-17 DIAGNOSIS — Z9889 Other specified postprocedural states: Secondary | ICD-10-CM

## 2020-12-17 NOTE — Progress Notes (Signed)
  HPI: Patient returns for routine postoperative follow-up having undergone see op note on 12/09/20.  The patient's immediate postoperative recovery has been unremarkable. Since hospital discharge the patient reports no ptoblems.   Current Outpatient Medications: Prenatal Vit-Fe Fumarate-FA (MULTIVITAMIN-PRENATAL) 27-0.8 MG TABS tablet, Take 1 tablet by mouth daily at 12 noon., Disp: , Rfl:  hydrochlorothiazide (HYDRODIURIL) 25 MG tablet, Take 1 tablet (25 mg total) by mouth daily. (Patient not taking: No sig reported), Disp: 30 tablet, Rfl: 0  No current facility-administered medications for this visit.    Blood pressure (!) 142/89, pulse 63, height '5\' 1"'$  (1.549 m), weight 144 lb (65.3 kg), not currently breastfeeding.  Physical Exam: Normal abdominal exam All 3 incisions look good  Diagnostic Tests:   Pathology: Normal benign  Impression: S/P bilateral salpingectomy for sterilization, failed endometrial ablation   Plan: No orders of the defined types were placed in this encounter.    Follow up: 04/24/21   [name], MD

## 2020-12-31 ENCOUNTER — Other Ambulatory Visit: Payer: Self-pay | Admitting: Women's Health

## 2021-01-05 ENCOUNTER — Ambulatory Visit (INDEPENDENT_AMBULATORY_CARE_PROVIDER_SITE_OTHER): Payer: Medicaid Other

## 2021-01-05 ENCOUNTER — Other Ambulatory Visit: Payer: Self-pay

## 2021-01-05 ENCOUNTER — Ambulatory Visit (INDEPENDENT_AMBULATORY_CARE_PROVIDER_SITE_OTHER): Payer: Medicaid Other | Admitting: Podiatry

## 2021-01-05 ENCOUNTER — Encounter: Payer: Self-pay | Admitting: Podiatry

## 2021-01-05 DIAGNOSIS — M21611 Bunion of right foot: Secondary | ICD-10-CM | POA: Diagnosis not present

## 2021-01-05 DIAGNOSIS — S93602A Unspecified sprain of left foot, initial encounter: Secondary | ICD-10-CM

## 2021-01-05 DIAGNOSIS — M216X2 Other acquired deformities of left foot: Secondary | ICD-10-CM | POA: Diagnosis not present

## 2021-01-05 DIAGNOSIS — M216X1 Other acquired deformities of right foot: Secondary | ICD-10-CM

## 2021-01-05 DIAGNOSIS — M21612 Bunion of left foot: Secondary | ICD-10-CM

## 2021-01-05 MED ORDER — MELOXICAM 15 MG PO TABS: 15.0000 mg | ORAL_TABLET | Freq: Every day | ORAL | 3 refills | Status: DC

## 2021-01-10 ENCOUNTER — Encounter: Payer: Self-pay | Admitting: Podiatry

## 2021-01-10 NOTE — Progress Notes (Signed)
  Subjective:  Patient ID: Erin Dennis, female    DOB: May 07, 1981,  MRN: [id number]  Chief Complaint  Patient presents with   Foot Pain      (xrays)(NP)BILATERAL FOOT PAIN, BIG TOE TURNED INWARD-BILATERAL, SMALL KNOT    40 y.o. female presents with the above complaint. History confirmed with patient.  She had an appointment scheduled today to be evaluated for bunions which have become increasingly painful, however today she slipped and while at the gas station and now has several abrasions, her foot turned under her on the left foot.  The right side is okay..  The left is worse than the right the source of the bunions go.  Objective:  Physical Exam: warm, good capillary refill, no trophic changes or ulcerative lesions, normal DP and PT pulses, and normal sensory exam.  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  She has moderate pain over the midfoot diffusely on palpation the fifth metatarsal base on the left foot.  No gross instability.  No ecchymosis.  Small partial-thickness abrasions   Radiographs: X-ray of both feet: no fracture, dislocation, swelling or degenerative changes noted hallux valgus deformity is noted Assessment:   1. Bilateral bunions   2. Sprain of left foot, initial encounter      Plan:  Patient was evaluated and treated and all questions answered.  Unfortunately she suffered a sprain of her left foot and injured this in a fall on the way to the office today.  There is no evidence of fracture or dislocation.  Hopeful this is just a sprain.  Prescribed her CAM boot from [facility] and prescribed meloxicam for pain control.  Reviewed RICE protocol.  Ace wrap applied.  We also discussed correction of bunion surgically.  Unfortunately we will have to repeat her x-rays at her next visit because she was not able to fully weight-bear and I need to better understanding of her deformity.  We will take bilateral foot x-rays with sesamoid axial  views to plan for bunion surgery when her injury is healed.  Return in about 4 weeks (around 02/02/2021) for ankle sprain left, new xrays.

## 2021-01-26 ENCOUNTER — Ambulatory Visit: Payer: Medicaid Other | Admitting: Podiatry

## 2021-04-26 ENCOUNTER — Telehealth: Payer: Self-pay | Admitting: Women's Health

## 2021-04-26 NOTE — Telephone Encounter (Signed)
Patient wants a nurse to call her.

## 2021-04-26 NOTE — Telephone Encounter (Signed)
Responded to Mychart msg.

## 2021-04-27 ENCOUNTER — Other Ambulatory Visit: Payer: Self-pay | Admitting: Obstetrics & Gynecology

## 2021-04-27 MED ORDER — ESCITALOPRAM OXALATE 10 MG PO TABS: 10.0000 mg | ORAL_TABLET | Freq: Every day | ORAL | 3 refills | Status: DC

## 2021-05-11 ENCOUNTER — Other Ambulatory Visit: Payer: Self-pay | Admitting: *Deleted

## 2021-05-11 ENCOUNTER — Encounter: Payer: Self-pay | Admitting: Obstetrics & Gynecology

## 2021-05-11 ENCOUNTER — Other Ambulatory Visit: Payer: Self-pay

## 2021-05-11 ENCOUNTER — Ambulatory Visit (INDEPENDENT_AMBULATORY_CARE_PROVIDER_SITE_OTHER): Payer: Medicaid Other | Admitting: Obstetrics & Gynecology

## 2021-05-11 VITALS — BP 124/86 | HR 67 | Ht 61.0 in | Wt 154.0 lb

## 2021-05-11 DIAGNOSIS — R151 Fecal smearing: Secondary | ICD-10-CM | POA: Diagnosis not present

## 2021-05-11 DIAGNOSIS — N816 Rectocele: Secondary | ICD-10-CM | POA: Diagnosis not present

## 2021-05-11 DIAGNOSIS — N3946 Mixed incontinence: Secondary | ICD-10-CM | POA: Diagnosis not present

## 2021-05-11 DIAGNOSIS — N8110 Cystocele, unspecified: Secondary | ICD-10-CM | POA: Diagnosis not present

## 2021-05-11 NOTE — Progress Notes (Signed)
Chief Complaint  Patient presents with   Unable to control urine, bowels      40 y.o. PO:[id number] No LMP recorded. (Menstrual status: IUD). The current method of family planning is tubal ligation. Also S/P endometrial ablation IUD is in for ongoing complete endometrial suppression   Outpatient Encounter Medications as of 05/11/2021  Medication Sig   escitalopram (LEXAPRO) 10 MG tablet Take 1 tablet (10 mg total) by mouth daily.   Prenatal Vit-Fe Fumarate-FA (MULTIVITAMIN-PRENATAL) 27-0.8 MG TABS tablet Take 1 tablet by mouth daily at 12 noon.   hydrochlorothiazide (HYDRODIURIL) 25 MG tablet Take 1 tablet (25 mg total) by mouth daily. (Patient not taking: No sig reported)   meloxicam (MOBIC) 15 MG tablet Take 1 tablet (15 mg total) by mouth daily. (Patient not taking: Reported on 05/11/2021)   No facility-administered encounter medications on file as of 05/11/2021.    Subjective Pt delivered vaginally 10/15/20 and experienced postpartum loss of urine with stress and without stress as well as inability to control flattus and fecal soiling  She thought this would improve with time after delivery but it has not, nor has either bowel or bladder issues improved  Gets up 2-3 times per night Her urine loss is typically a few drops at the time  No constipation, BMs are pasty  Past Medical History:  Diagnosis Date   Anxiety    Darier disease    Depression    following miscarriage   Febrile seizure ([place])    Gestational diabetes    gestational   Infection    UTI   Ovarian cyst     Past Surgical History:  Procedure Laterality Date   APPENDECTOMY     CHOLECYSTECTOMY     DILATATION AND CURETTAGE/HYSTEROSCOPY WITH MINERVA N/A 12/09/2020   Procedure: DILATATION AND CURETTAGE/HYSTEROSCOPY WITH ATTEMPTED  MINERVA  ENDOMETRIAL ABLATION;  Surgeon: [name], MD;  Location: [facility];  Service: Gynecology;  Laterality: N/A;   DILATION AND CURETTAGE OF UTERUS     for menorrhagia    DILATION AND CURETTAGE OF UTERUS N/A 10/07/2019   Procedure: SUCTION DILATATION AND CURETTAGE;  Surgeon: [name], MD;  Location: [facility];  Service: Gynecology;  Laterality: N/A;  Dr. Request Time 9:30am   LAPAROSCOPIC BILATERAL SALPINGECTOMY Bilateral 12/09/2020   Procedure: LAPAROSCOPIC BILATERAL SALPINGECTOMY;  Surgeon: [name], MD;  Location: [facility];  Service: Gynecology;  Laterality: Bilateral;   OVARIAN CYST SURGERY     x3   TONSILLECTOMY     TONSILLECTOMY  2007   WISDOM TOOTH EXTRACTION      OB History     Gravida  3   Para  1   Term  1   Preterm      AB  2   Living  1      SAB  1   IAB      Ectopic  1   Multiple      Live Births  1           Allergies  Allergen Reactions   Acetaminophen Anaphylaxis and Rash   5-Alpha Reductase Inhibitors    Sulfamethoxazole-Trimethoprim Other (See Comments)    Blisters   Tape Rash    Social History   Socioeconomic History   Marital status: Married    Spouse name: Not on file   Number of children: Not on file   Years of education: Not on file   Highest education level: Not on file  Occupational  History   Not on file  Tobacco Use   Smoking status: Never   Smokeless tobacco: Never  Vaping Use   Vaping Use: Never used  Substance and Sexual Activity   Alcohol use: Not Currently   Drug use: Not Currently   Sexual activity: Yes    Birth control/protection: I.U.D., Surgical    Comment: BTL  Other Topics Concern   Not on file  Social History Narrative   Not on file   Social Determinants of Health   Financial Resource Strain: Low Risk    Difficulty of Paying Living Expenses: Not hard at all  Food Insecurity: No Food Insecurity   Worried About Charity fundraiser in the Last Year: Never true   Ran Out of Food in the Last Year: Never true  Transportation Needs: No Transportation Needs   Lack of Transportation (Medical): No   Lack of Transportation (Non-Medical): No  Physical Activity:  Insufficiently Active   Days of Exercise per Week: 2 days   Minutes of Exercise per Session: 30 min  Stress: No Stress Concern Present   Feeling of Stress : Only a little  Social Connections: Moderately Integrated   Frequency of Communication with Friends and Family: More than three times a week   Frequency of Social Gatherings with Friends and Family: Three times a week   Attends Religious Services: 1 to 4 times per year   Active Member of Clubs or Organizations: No   Attends Archivist Meetings: Never   Marital Status: Married    Family History  Problem Relation Age of Onset   Diabetes Mother    Hypertension Mother    Diabetes Father    Stroke Father    Cancer Maternal Grandmother        ovarian   Jaundice Son     Medications:       Current Outpatient Medications:    escitalopram (LEXAPRO) 10 MG tablet, Take 1 tablet (10 mg total) by mouth daily., Disp: 30 tablet, Rfl: 3   Prenatal Vit-Fe Fumarate-FA (MULTIVITAMIN-PRENATAL) 27-0.8 MG TABS tablet, Take 1 tablet by mouth daily at 12 noon., Disp: , Rfl:    hydrochlorothiazide (HYDRODIURIL) 25 MG tablet, Take 1 tablet (25 mg total) by mouth daily. (Patient not taking: No sig reported), Disp: 30 tablet, Rfl: 0   meloxicam (MOBIC) 15 MG tablet, Take 1 tablet (15 mg total) by mouth daily. (Patient not taking: Reported on 05/11/2021), Disp: 30 tablet, Rfl: 3  Objective Blood pressure 124/86, pulse 67, height '5\' 1"'$  (1.549 m), weight 154 lb (69.9 kg), not currently breastfeeding.  General WDWN female NAD Vulva:  normal appearing vulva with no masses, tenderness or lesions Vagina:  normal mucosa, no discharge, Grade 2 cystocoele without significant urethral hypermobility noted Grade 2 rectal relaxation as well with some firm stool Cervix:  Normal no lesions Uterus:  normal size, contour, position, consistency, mobility, non-tender Adnexa: ovaries:present,  normal adnexa in size, nontender and no masses   Pertinent ROS No  burning with urination, frequency or urgency No nausea, vomiting or diarrhea Nor fever chills or other constitutional symptoms   Labs or studies N/A    Impression Diagnoses this Encounter::   ICD-10-CM   1. POP-Q stage 2 cystocele  N81.10     2. POP-Q stage 2 rectocele  N81.6     3. Fecal smearing  R15.1     4. Mixed urge and stress incontinence, stress predominant  N39.46       Established relevant  diagnosis(es):   Plan/Recommendations: No orders of the defined types were placed in this encounter.   Labs or Scans Ordered: No orders of the defined types were placed in this encounter.   Management:: Refer to pelvic PT for evaluation and treatment of POP with incontinence  Follow up Return in about 3 months (around 08/11/2021) for Follow up, with Dr Elonda Husky.      All questions were answered.

## 2021-05-19 ENCOUNTER — Other Ambulatory Visit: Payer: Self-pay | Admitting: Obstetrics & Gynecology

## 2021-06-01 ENCOUNTER — Encounter: Payer: Self-pay | Admitting: Physical Therapy

## 2021-06-01 ENCOUNTER — Other Ambulatory Visit: Payer: Self-pay

## 2021-06-01 ENCOUNTER — Ambulatory Visit: Payer: Medicaid Other | Attending: Obstetrics & Gynecology | Admitting: Physical Therapy

## 2021-06-01 DIAGNOSIS — R279 Unspecified lack of coordination: Secondary | ICD-10-CM | POA: Insufficient documentation

## 2021-06-01 DIAGNOSIS — M6281 Muscle weakness (generalized): Secondary | ICD-10-CM | POA: Diagnosis present

## 2021-06-01 NOTE — Therapy (Signed)
[facility] @ [address] [place], [place], [address] Phone: [phone]   Fax:  [phone]  Physical Therapy Evaluation  Patient Details  Name: Erin Dennis MRN: [id number] Date of Birth: [birth year] Referring Provider (PT): [name], MD   Encounter Date: 06/01/2021   PT End of Session - 06/01/21 1310     Visit Number 1    Date for PT Re-Evaluation 08/24/21    Authorization Type medicaid UHC uhc - [id number] N 1x/12 weeks    PT Start Time 1145    PT Stop Time 1223    PT Time Calculation (min) 38 min    Activity Tolerance Patient tolerated treatment well             Past Medical History:  Diagnosis Date   Anxiety    Darier disease    Depression    following miscarriage   Febrile seizure ([place])    Gestational diabetes    gestational   Infection    UTI   Ovarian cyst     Past Surgical History:  Procedure Laterality Date   APPENDECTOMY     CHOLECYSTECTOMY     DILATATION AND CURETTAGE/HYSTEROSCOPY WITH MINERVA N/A 12/09/2020   Procedure: DILATATION AND CURETTAGE/HYSTEROSCOPY WITH ATTEMPTED  MINERVA  ENDOMETRIAL ABLATION;  Surgeon: [name], MD;  Location: [facility];  Service: Gynecology;  Laterality: N/A;   DILATION AND CURETTAGE OF UTERUS     for menorrhagia   DILATION AND CURETTAGE OF UTERUS N/A 10/07/2019   Procedure: SUCTION DILATATION AND CURETTAGE;  Surgeon: [name], MD;  Location: [facility];  Service: Gynecology;  Laterality: N/A;  Dr. Request Time 9:30am   LAPAROSCOPIC BILATERAL SALPINGECTOMY Bilateral 12/09/2020   Procedure: LAPAROSCOPIC BILATERAL SALPINGECTOMY;  Surgeon: [name], MD;  Location: [facility];  Service: Gynecology;  Laterality: Bilateral;   OVARIAN CYST SURGERY     x3   TONSILLECTOMY     TONSILLECTOMY  2007   WISDOM TOOTH EXTRACTION      There were no vitals filed for this visit.    Subjective Assessment - 06/01/21 1152     Subjective Pt had a foreceps delivery in  March and had a hematoma.  Pt has leakage of bowel, baldder, and gas.  Pt has leakage every day and wearing a diaper 2-3/day.  Getting up 2x/night to pee.  Have daily BMs and it is pasty and it keeps wiping.  Using baby wipes.  BMs are 2-3/day fecal leakage is not daily but when coughing and sneezing, squatting the whole bladder.  I cannot hold bladder when I have urge.    Currently in Pain? No/denies                [facility] PT Assessment - 06/01/21 0001       Assessment   Medical Diagnosis N81.10 (ICD-10-CM) - POP-Q stage 2 cystocele;N81.6 (ICD-10-CM) - POP-Q stage 2 rectocele;N39.46 (ICD-10-CM) - Mixed urge and stress    Referring Provider (PT) [name], MD    Prior Therapy No      Balance Screen   Has the patient fallen in the past 6 months No      [place] residence    Living Arrangements Children;Spouse/significant other      Prior Function   Level of Independence Independent    Vocation --   SAHM     Cognition   Overall Cognitive Status Within Functional Limits for tasks assessed  Functional Tests   Functional tests Single leg stance;Squat      Squat   Comments normal      Single Leg Stance   Comments normal      Posture/Postural Control   Posture/Postural Control Postural limitations    Postural Limitations Rounded Shoulders      ROM / Strength   AROM / PROM / Strength PROM      PROM   Overall PROM Comments hip flexion 80%; rotation normal      Flexibility   Soft Tissue Assessment /Muscle Length yes    Hamstrings 60% bil      Palpation   SI assessment  normal    Palpation comment tight lumbar and gluteals      Ambulation/Gait   Gait Pattern Within Functional Limits                        Objective measurements completed on examination: See above findings.     Pelvic Floor Special Questions - 06/01/21 0001     Are you Pregnant or attempting pregnancy? No    Prior Pregnancies Yes     Number of Pregnancies 1    Number of Vaginal Deliveries 1    Any difficulty with labor and deliveries Yes    Diastasis Recti no    Currently Sexually Active Yes    Is this Painful No    Urinary Leakage Yes    How often every day    Pad use 3diapers per day    Activities that cause leaking With strong urge;Coughing;Sneezing;Lifting;Bending    Urinary urgency Yes    Urinary frequency going a lot even when not a lot of pee    Fecal incontinence Yes    Fluid intake a lot of water    Falling out feeling (prolapse) Yes   constant feeling   Activities that cause feeling of prolapse constant feeling    Pelvic Floor Internal Exam pt identity confirmed    Exam Type Vaginal    Palpation low tone and weak levators; posterior pelvic floor tight; using gluteals and rectus abdominus    Strength Flicker    Strength # of seconds 1    Tone low             No emotional/communication barriers or cognitive limitation. Patient is motivated to learn. Patient understands and agrees with treatment goals and plan. PT explains patient will be examined in standing, sitting, and lying down to see how their muscles and joints work. When they are ready, they will be asked to remove their underwear so PT can examine their perineum. The patient is also given the option of providing their own chaperone as one is not provided in our facility. The patient also has the right and is explained the right to defer or refuse any part of the evaluation or treatment including the internal exam. With the patient's consent, PT will use one gloved finger to gently assess the muscles of the pelvic floor, seeing how well it contracts and relaxes and if there is muscle symmetry. After, the patient will get dressed and PT and patient will discuss exam findings and plan of care. PT and patient discuss plan of care, schedule, attendance policy and HEP activities.           PT Education - 06/01/21 1226     Education Details  Access Code: [email or web address]    Person(s) Educated Patient    Methods Explanation;Demonstration;Tactile cues;Verbal cues;Handout  Comprehension Verbalized understanding;Returned demonstration                 PT Long Term Goals - 06/01/21 1354       PT LONG TERM GOAL #1   Title Pt will report she can control her bladder better and has 70% less leakage with coughing and sneezing    Time 12    Period Weeks    Status New    Target Date 08/24/21      PT LONG TERM GOAL #2   Title Pt will be able to do a kegel with at least 3/5 MMT in order to reduce the urge to void    Time 12    Period Weeks    Status New    Target Date 08/24/21      PT LONG TERM GOAL #3   Title Pt will be ind with advanced HEP and feel like she can continue strengthening routine on her own    Time 26    Period Weeks    Status New    Target Date 08/24/21      PT LONG TERM GOAL #4   Title Pt will have posterior pelvic control improved in order to reduce fecal and gas incontinence to none.    Time 12    Period Weeks    Status New    Target Date 08/24/21                    Plan - 06/01/21 1248     Clinical Impression Statement Pt presents to skilled PT due to incontinence of both fecal and urinary.  Pt had forceps delivery that was very traumatic causing a large hematoma and tough healing process.  Pt has tension in posterior pelvic floor.  Seems to have levator evulsion bilaterally but more on the Rt than Lt.  Pt has tight gluteals.  Bil hip strength is 5/5 excepts 4+/5 abduction bil.  Pt only has a flicker 1/5 MMT of pelvic floor.  She was co-contracting abs and glutes and bearing down initially.  Pt will benefit from skilled PT and was encouraged to get estim unit for kegel exercises due to very weak muscle contraction.    Personal Factors and Comorbidities Comorbidity 2;Comorbidity 1;Time since onset of injury/illness/exacerbation    Comorbidities vaginal delivery; foreceps used during delivery     Examination-Activity Limitations Toileting;Bend;Lift;Continence;Squat    Examination-Participation Restrictions Community Activity    Stability/Clinical Decision Making Evolving/Moderate complexity    Clinical Decision Making Moderate    Rehab Potential Good    PT Frequency 1x / week    PT Duration 12 weeks    PT Treatment/Interventions ADLs/Self Care Home Management;Biofeedback;Cryotherapy;Electrical Stimulation;Moist Heat;Neuromuscular re-education;Therapeutic exercise;Therapeutic activities;Patient/family education;Manual techniques;Passive range of motion;Dry needling;Taping    PT Next Visit Plan core strength, review how to use stim if has it, internal tactile cues, assess anal sphincter reflex/tone, exhale with exertion    PT Home Exercise Plan Access Code: [email or web address]    Consulted and Agree with Plan of Care Patient             Patient will benefit from skilled therapeutic intervention in order to improve the following deficits and impairments:  Decreased strength, Decreased coordination, Decreased range of motion, Increased fascial restricitons, Impaired tone, Impaired flexibility, Decreased endurance, Increased muscle spasms  Visit Diagnosis: Unspecified lack of coordination  Muscle weakness (generalized)     Problem List Patient Active Problem List   Diagnosis Date Noted   Menorrhagia  with regular cycle    Dysmenorrhea    Postpartum hypertension 11/26/2020   IUD (intrauterine device) in place 10/15/2020   Forceps delivery with baby delivered 10/15/2020   Hematoma 10/15/2020   History of gestational hypertension 10/14/2020   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

## 2021-06-01 NOTE — Patient Instructions (Addendum)
    Access Code: [email or web address] URL: [email or web address] Date: 06/01/2021 Prepared by: Jari Favre  Exercises Supine Pelvic Floor Contraction - 3 x daily - 7 x weekly - 1 sets - 10 reps - 3 sec hold

## 2021-06-29 ENCOUNTER — Encounter: Payer: Self-pay | Admitting: Physical Therapy

## 2021-06-29 ENCOUNTER — Ambulatory Visit: Payer: Medicaid Other | Admitting: Physical Therapy

## 2021-06-29 ENCOUNTER — Other Ambulatory Visit: Payer: Self-pay

## 2021-06-29 DIAGNOSIS — M6281 Muscle weakness (generalized): Secondary | ICD-10-CM

## 2021-06-29 DIAGNOSIS — R279 Unspecified lack of coordination: Secondary | ICD-10-CM | POA: Diagnosis not present

## 2021-06-29 NOTE — Therapy (Signed)
[facility] @ [address] [place], [place], [address] Phone: [phone]   Fax:  [phone]  Physical Therapy Treatment  Patient Details  Name: Erin Dennis MRN: [id number] Date of Birth: [birth year] Referring Provider (PT): [name], MD   Encounter Date: 06/29/2021   PT End of Session - 06/29/21 0939     Visit Number 2    Date for PT Re-Evaluation 08/24/21    Authorization Type medicaid UHC uhc - [id number] N 1x/12 weeks    PT Start Time 0933    PT Stop Time 1013    PT Time Calculation (min) 40 min    Activity Tolerance Patient tolerated treatment well    Behavior During Therapy [facility] for tasks assessed/performed             Past Medical History:  Diagnosis Date   Anxiety    Darier disease    Depression    following miscarriage   Febrile seizure ([place])    Gestational diabetes    gestational   Infection    UTI   Ovarian cyst     Past Surgical History:  Procedure Laterality Date   APPENDECTOMY     CHOLECYSTECTOMY     DILATATION AND CURETTAGE/HYSTEROSCOPY WITH MINERVA N/A 12/09/2020   Procedure: DILATATION AND CURETTAGE/HYSTEROSCOPY WITH ATTEMPTED  MINERVA  ENDOMETRIAL ABLATION;  Surgeon: [name], MD;  Location: [facility];  Service: Gynecology;  Laterality: N/A;   DILATION AND CURETTAGE OF UTERUS     for menorrhagia   DILATION AND CURETTAGE OF UTERUS N/A 10/07/2019   Procedure: SUCTION DILATATION AND CURETTAGE;  Surgeon: [name], MD;  Location: [facility];  Service: Gynecology;  Laterality: N/A;  Dr. Request Time 9:30am   LAPAROSCOPIC BILATERAL SALPINGECTOMY Bilateral 12/09/2020   Procedure: LAPAROSCOPIC BILATERAL SALPINGECTOMY;  Surgeon: [name], MD;  Location: [facility];  Service: Gynecology;  Laterality: Bilateral;   OVARIAN CYST SURGERY     x3   TONSILLECTOMY     TONSILLECTOMY  2007   WISDOM TOOTH EXTRACTION      There were no vitals filed for this visit.   Subjective Assessment -  06/29/21 0936     Subjective Pt is still leaking all the time when bending, squatting, getting on the foor.  Pt states BMs are better and hasn't had any leakage of that.    Currently in Pain? No/denies                               [facility] Adult PT Treatment/Exercise - 06/29/21 0001       Exercises   Exercises Lumbar      Lumbar Exercises: Supine   Ab Set 10 reps    AB Set Limitations added kegel cues to avoid bearing down    Other Supine Lumbar Exercises ball overhead and presse    Other Supine Lumbar Exercises kegel with ball squeeze      Manual Therapy   Manual Therapy Internal Pelvic Floor;Soft tissue mobilization    Manual therapy comments pt identity confirmed and iformed consent given to perform internal soft tissue    Soft tissue mobilization lumbar and thoraic paraspinal    Internal Pelvic Floor tactile cues given during exercises                     PT Education - 06/29/21 1011     Education Details Access Code: [facility]  Person(s) Educated Patient    Methods Explanation;Demonstration;Tactile cues;Verbal cues;Handout    Comprehension Verbalized understanding;Returned demonstration                 PT Long Term Goals - 06/29/21 1031       PT LONG TERM GOAL #1   Title Pt will report she can control her bladder better and has 70% less leakage with coughing and sneezing    Status On-going      PT LONG TERM GOAL #2   Title Pt will be able to do a kegel with at least 3/5 MMT in order to reduce the urge to void    Status On-going      PT LONG TERM GOAL #3   Title Pt will be ind with advanced HEP and feel like she can continue strengthening routine on her own    Status On-going      PT LONG TERM GOAL #4   Title Pt will have posterior pelvic control improved in order to reduce fecal and gas incontinence to none.    Status On-going                   Plan - 06/29/21 1032     Clinical Impression Statement Pt did  well with tactile cues.  She needed touse overflow of core and adductors to get a faint pelvic floor contraction. Pt was bearing down and need VC/TC to exhale with kegel.  Pt also very tight throughout lumbar and thoracic spine  She was given stretches to address back muscle length.  Pt will benefit from skilled PT to continue to work on posture, back pain, and core strength for bladder control.    PT Treatment/Interventions ADLs/Self Care Home Management;Biofeedback;Cryotherapy;Electrical Stimulation;Moist Heat;Neuromuscular re-education;Therapeutic exercise;Therapeutic activities;Patient/family education;Manual techniques;Passive range of motion;Dry needling;Taping    PT Next Visit Plan core strength, review how to use stim if has it, exhale with exertion, lumbar STM and might want to discuss dry needling see if she try    PT Home Exercise Plan Access Code: [email or web address]    Consulted and Agree with Plan of Care Patient             Patient will benefit from skilled therapeutic intervention in order to improve the following deficits and impairments:  Decreased strength, Decreased coordination, Decreased range of motion, Increased fascial restricitons, Impaired tone, Impaired flexibility, Decreased endurance, Increased muscle spasms  Visit Diagnosis: Unspecified lack of coordination  Muscle weakness (generalized)     Problem List Patient Active Problem List   Diagnosis Date Noted   Menorrhagia with regular cycle    Dysmenorrhea    Postpartum hypertension 11/26/2020   IUD (intrauterine device) in place 10/15/2020   Forceps delivery with baby delivered 10/15/2020   Hematoma 10/15/2020   History of gestational hypertension 10/14/2020   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

## 2021-06-29 NOTE — Patient Instructions (Signed)
Access Code: [email or web address] URL: [email or web address] Date: 06/29/2021 Prepared by: Jari Favre  Exercises Supine Pelvic Floor Contraction - 3 x daily - 7 x weekly - 1 sets - 10 reps - 3 sec hold Supine Hip Adductor Squeeze with Small Ball - 3 x daily - 7 x weekly - 1 sets - 10 reps - 3-5 sec hold Supine Double Knee to Chest - 1 x daily - 7 x weekly - 3 sets - 10 reps Hooklying Single Knee to Chest Stretch - 1 x daily - 7 x weekly - 5 reps - 1 sets - 20 sec hold

## 2021-07-05 ENCOUNTER — Telehealth: Payer: Self-pay | Admitting: Physical Therapy

## 2021-07-05 ENCOUNTER — Ambulatory Visit: Payer: Medicaid Other | Attending: Obstetrics & Gynecology | Admitting: Physical Therapy

## 2021-07-05 DIAGNOSIS — M6281 Muscle weakness (generalized): Secondary | ICD-10-CM | POA: Insufficient documentation

## 2021-07-05 DIAGNOSIS — R279 Unspecified lack of coordination: Secondary | ICD-10-CM | POA: Insufficient documentation

## 2021-07-05 NOTE — Telephone Encounter (Signed)
Patient did not show for appointment.  Patient was called and PT left message to please call us back.  Gustavus Bryant, PT 07/05/21 11:24 AM

## 2021-07-12 ENCOUNTER — Encounter: Payer: Self-pay | Admitting: Physical Therapy

## 2021-07-12 ENCOUNTER — Other Ambulatory Visit: Payer: Self-pay

## 2021-07-12 ENCOUNTER — Ambulatory Visit: Payer: Medicaid Other | Admitting: Physical Therapy

## 2021-07-12 DIAGNOSIS — M6281 Muscle weakness (generalized): Secondary | ICD-10-CM | POA: Diagnosis present

## 2021-07-12 DIAGNOSIS — R279 Unspecified lack of coordination: Secondary | ICD-10-CM

## 2021-07-12 NOTE — Therapy (Signed)
[facility] @ [place], [place], [address] Phone: [phone]   Fax:  [phone]  Physical Therapy Treatment  Patient Details  Name: Erin Dennis MRN: [id number] Date of Birth: 10-May-1981 Referring Provider (PT): [name], MD   Encounter Date: 07/12/2021   PT End of Session - 07/12/21 1111     Visit Number 3    Date for PT Re-Evaluation 08/24/21    Authorization Type medicaid [employer] uhc - [id number] N 1x/12 weeks    PT Start Time 1102    PT Stop Time 1142    PT Time Calculation (min) 40 min    Activity Tolerance Patient tolerated treatment well    Behavior During Therapy [facility] for tasks assessed/performed             Past Medical History:  Diagnosis Date   Anxiety    Darier disease    Depression    following miscarriage   Febrile seizure ([place])    Gestational diabetes    gestational   Infection    UTI   Ovarian cyst     Past Surgical History:  Procedure Laterality Date   APPENDECTOMY     CHOLECYSTECTOMY     DILATATION AND CURETTAGE/HYSTEROSCOPY WITH MINERVA N/A 12/09/2020   Procedure: DILATATION AND CURETTAGE/HYSTEROSCOPY WITH ATTEMPTED  MINERVA  ENDOMETRIAL ABLATION;  Surgeon: [name], MD;  Location: [facility];  Service: Gynecology;  Laterality: N/A;   DILATION AND CURETTAGE OF UTERUS     for menorrhagia   DILATION AND CURETTAGE OF UTERUS N/A 10/07/2019   Procedure: SUCTION DILATATION AND CURETTAGE;  Surgeon: [name], MD;  Location: [facility];  Service: Gynecology;  Laterality: N/A;  Dr. Request Time 9:30am   LAPAROSCOPIC BILATERAL SALPINGECTOMY Bilateral 12/09/2020   Procedure: LAPAROSCOPIC BILATERAL SALPINGECTOMY;  Surgeon: [name], MD;  Location: [facility];  Service: Gynecology;  Laterality: Bilateral;   OVARIAN CYST SURGERY     x3   TONSILLECTOMY     TONSILLECTOMY  2007   WISDOM TOOTH EXTRACTION      There were no vitals filed for this visit.   Subjective Assessment -  07/12/21 1103     Subjective Pt states the leaking is dribbling when she squats and coughing and picking baby up.  Pt has not had bowel leakage.                               [facility] Adult PT Treatment/Exercise - 07/12/21 0001       Neuro Re-ed    Neuro Re-ed Details  exhale with kegel and coordination of pelvic floor with exercises      Lumbar Exercises: Supine   Ab Set 15 reps    AB Set Limitations with ball squeeze, and with overhead reach - needs cues to exhale    Other Supine Lumbar Exercises ball overhead and presse    Other Supine Lumbar Exercises kegel with ball squeeze      Manual Therapy   Manual therapy comments pt identity confirmed and iformed consent given to perform internal soft tissue cues during exercises                     PT Education - 07/12/21 1145     Education Details reviewed basic exercises and exhale with exertion    Person(s) Educated Patient    Methods Explanation;Tactile cues;Verbal cues;Demonstration    Comprehension Verbalized  understanding;Returned demonstration                 PT Long Term Goals - 06/29/21 1031       PT LONG TERM GOAL #1   Title Pt will report she can control her bladder better and has 70% less leakage with coughing and sneezing    Status On-going      PT LONG TERM GOAL #2   Title Pt will be able to do a kegel with at least 3/5 MMT in order to reduce the urge to void    Status On-going      PT LONG TERM GOAL #3   Title Pt will be ind with advanced HEP and feel like she can continue strengthening routine on her own    Status On-going      PT LONG TERM GOAL #4   Title Pt will have posterior pelvic control improved in order to reduce fecal and gas incontinence to none.    Status On-going                   Plan - 07/12/21 1356     Clinical Impression Statement Pt did well with exerices with a lot of cues to engage the muscles correctly and not push down with kegel.  Pt  was able to understand how to do this correctly and has improved techniques for doing the HEP.    PT Treatment/Interventions ADLs/Self Care Home Management;Biofeedback;Cryotherapy;Electrical Stimulation;Moist Heat;Neuromuscular re-education;Therapeutic exercise;Therapeutic activities;Patient/family education;Manual techniques;Passive range of motion;Dry needling;Taping    PT Next Visit Plan review exhale with exertion, internal cues and re-assess and progress exercises    PT Home Exercise Plan Access Code: [email or web address]    Consulted and Agree with Plan of Care Patient             Patient will benefit from skilled therapeutic intervention in order to improve the following deficits and impairments:  Decreased strength, Decreased coordination, Decreased range of motion, Increased fascial restricitons, Impaired tone, Impaired flexibility, Decreased endurance, Increased muscle spasms  Visit Diagnosis: Unspecified lack of coordination  Muscle weakness (generalized)     Problem List Patient Active Problem List   Diagnosis Date Noted   Menorrhagia with regular cycle    Dysmenorrhea    Postpartum hypertension 11/26/2020   IUD (intrauterine device) in place 10/15/2020   Forceps delivery with baby delivered 10/15/2020   Hematoma 10/15/2020   History of gestational hypertension 10/14/2020   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, [place], [address] Phone: [phone]   Fax:  [phone]  Name: Erin Dennis MRN: [id number] Date of Birth: 10-08-1980

## 2021-07-12 NOTE — Patient Instructions (Signed)
COVID-19 info: [email or web address] Get online care: [email or web address] Address: [address] [place], [place] [address] Phone: [phone]  Sherlene Shams, MD - urogynecologist

## 2021-07-19 ENCOUNTER — Encounter: Payer: Self-pay | Admitting: Physical Therapy

## 2021-07-19 ENCOUNTER — Ambulatory Visit: Payer: Medicaid Other | Admitting: Physical Therapy

## 2021-07-19 ENCOUNTER — Other Ambulatory Visit: Payer: Self-pay

## 2021-07-19 DIAGNOSIS — M6281 Muscle weakness (generalized): Secondary | ICD-10-CM

## 2021-07-19 DIAGNOSIS — R279 Unspecified lack of coordination: Secondary | ICD-10-CM

## 2021-07-19 NOTE — Patient Instructions (Signed)
Access Code: [email or web address] URL: [email or web address] Date: 07/19/2021 Prepared by: Jari Favre  Exercises Supine Pelvic Floor Contraction - 3 x daily - 7 x weekly - 1 sets - 10 reps - 3 sec hold Supine Hip Adductor Squeeze with Small Ball - 3 x daily - 7 x weekly - 1 sets - 10 reps - 3-5 sec hold Supine Double Knee to Chest - 1 x daily - 7 x weekly - 3 sets - 10 reps Hooklying Single Knee to Chest Stretch - 1 x daily - 7 x weekly - 1 sets - 5 reps - 20 sec hold Hip Flexor Stretch with Chair - 1 x daily - 7 x weekly - 3 reps - 1 sets - 30 sec hold Supine Hip Internal and External Rotation - 1 x daily - 7 x weekly - 10 reps - 1 sets - 5 sec hold Sidelying Thoracic Rotation with Open Book - 1 x daily - 7 x weekly - 5 reps - 1 sets - 10 sec hold

## 2021-07-19 NOTE — Therapy (Addendum)
[facility] @ [place], [place], [address] Phone: [phone]   Fax:  [phone]  Physical Therapy Treatment  Patient Details  Name: Erin Dennis MRN: [id number] Date of Birth: 06-Jul-1981 Referring Provider (PT): [name], MD   Encounter Date: 07/19/2021   PT End of Session - 07/19/21 1142     Visit Number 4    Date for PT Re-Evaluation 08/24/21    Authorization Type medicaid UHC uhc - [id number] N 1x/12 weeks    PT Start Time 1102    PT Stop Time 1142    PT Time Calculation (min) 40 min    Activity Tolerance Patient tolerated treatment well    Behavior During Therapy [facility] for tasks assessed/performed             Past Medical History:  Diagnosis Date   Anxiety    Darier disease    Depression    following miscarriage   Febrile seizure ([place])    Gestational diabetes    gestational   Infection    UTI   Ovarian cyst     Past Surgical History:  Procedure Laterality Date   APPENDECTOMY     CHOLECYSTECTOMY     DILATATION AND CURETTAGE/HYSTEROSCOPY WITH MINERVA N/A 12/09/2020   Procedure: DILATATION AND CURETTAGE/HYSTEROSCOPY WITH ATTEMPTED  MINERVA  ENDOMETRIAL ABLATION;  Surgeon: [name], MD;  Location: [facility];  Service: Gynecology;  Laterality: N/A;   DILATION AND CURETTAGE OF UTERUS     for menorrhagia   DILATION AND CURETTAGE OF UTERUS N/A 10/07/2019   Procedure: SUCTION DILATATION AND CURETTAGE;  Surgeon: [name], MD;  Location: [facility];  Service: Gynecology;  Laterality: N/A;  Dr. Request Time 9:30am   LAPAROSCOPIC BILATERAL SALPINGECTOMY Bilateral 12/09/2020   Procedure: LAPAROSCOPIC BILATERAL SALPINGECTOMY;  Surgeon: [name], MD;  Location: [facility];  Service: Gynecology;  Laterality: Bilateral;   OVARIAN CYST SURGERY     x3   TONSILLECTOMY     TONSILLECTOMY  2007   WISDOM TOOTH EXTRACTION      There were no vitals filed for this visit.   Subjective Assessment -  07/19/21 1127     Subjective Pt is having sciatic pain every morning on left side.  No change in symptoms right now as far as leakage.  Still getting up and down from floor and coughing and sneezing.    Patient Stated Goals stop leakage    Currently in Pain? No/denies                               [facility] Adult PT Treatment/Exercise - 07/19/21 0001       Self-Care   Self-Care Other Self-Care Comments    Other Self-Care Comments  tennis ball to gluteals massage      Neuro Re-ed    Neuro Re-ed Details  exhale with kegel and coordination of pelvic floor with exercises      Lumbar Exercises: Stretches   Other Lumbar Stretch Exercise hip rotation; hip flexor on step, knee to chest      Lumbar Exercises: Standing   Other Standing Lumbar Exercises standing kegel leaning on table      Manual Therapy   Manual Therapy Internal Pelvic Floor    Manual therapy comments pt identity confirmed and iformed consent given to perform internal soft tissue cues during exercises    Internal Pelvic Floor tactile cues given during  exercises; obdurator internus release bil                     PT Education - 07/19/21 1142     Education Details Access Code: [email or web address]    Person(s) Educated Patient    Methods Explanation;Demonstration;Tactile cues;Verbal cues;Handout    Comprehension Verbalized understanding;Returned demonstration                 PT Long Term Goals - 07/19/21 1143       PT LONG TERM GOAL #1   Title Pt will report she can control her bladder better and has 70% less leakage with coughing and sneezing    Status On-going      PT LONG TERM GOAL #2   Title Pt will be able to do a kegel with at least 3/5 MMT in order to reduce the urge to void    Status On-going      PT LONG TERM GOAL #3   Title Pt will be ind with advanced HEP and feel like she can continue strengthening routine on her own    Status On-going                   Plan -  07/19/21 1146     Clinical Impression Statement Very minimal muscle activation was palpated today but she did well with exhaling dueing the exercises.  No bulging when attempting to do kegel.  Pt was given updated stretches as she has very tight obdurator internus muscles bil and Rt sciatic pain. Pt will benefit from skilled PT to address pain and soft muscle impairments.    PT Treatment/Interventions ADLs/Self Care Home Management;Biofeedback;Cryotherapy;Electrical Stimulation;Moist Heat;Neuromuscular re-education;Therapeutic exercise;Therapeutic activities;Patient/family education;Manual techniques;Passive range of motion;Dry needling;Taping    PT Next Visit Plan review exhale with exertion, internal cues and re-assess and progress exercises    PT Home Exercise Plan Access Code: [email or web address]    Consulted and Agree with Plan of Care Patient             Patient will benefit from skilled therapeutic intervention in order to improve the following deficits and impairments:  Decreased strength, Decreased coordination, Decreased range of motion, Increased fascial restricitons, Impaired tone, Impaired flexibility, Decreased endurance, Increased muscle spasms  Visit Diagnosis: Unspecified lack of coordination  Muscle weakness (generalized)     Problem List Patient Active Problem List   Diagnosis Date Noted   Menorrhagia with regular cycle    Dysmenorrhea    Postpartum hypertension 11/26/2020   IUD (intrauterine device) in place 10/15/2020   Forceps delivery with baby delivered 10/15/2020   Hematoma 10/15/2020   History of gestational hypertension 10/14/2020   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 Patient goals were not met. Patient is being discharged due to not returning since the last visit. Pt having surgery  Gustavus Bryant, PT 08/30/21 3:20 PM

## 2021-07-27 ENCOUNTER — Ambulatory Visit: Payer: Medicaid Other | Admitting: Physical Therapy

## 2021-08-08 NOTE — Progress Notes (Signed)
[facility] New Patient Evaluation and Consultation  Referring Provider: [facility] PCP: [facility] Date of Service: 08/09/2021  SUBJECTIVE Chief Complaint: New Patient (Initial Visit) (Erin Dennis is a 41 y.o. female here for a prolapse evaluation.)  History of Present Illness: Erin Dennis is a 41 y.o. White or Caucasian female presenting for evaluation of prolapse and bladder leakage.     Urinary Symptoms: Leaks urine with cough/ sneeze, laughing, and with urgency. SUI > UUI Leaks several times per day.  Pad use: 3 pads per day.   She is bothered by her UI symptoms. She has attended pelvic floor PT- has not seen improvement. Also used a pelvic floor stimulator.   Day time voids 9-10.  Nocturia: 2 times per night to void. Voiding dysfunction: she empties her bladder well.  does not use a catheter to empty bladder.  When urinating, she feels dribbling after finishing Drinks: 8 bottles water, decaf tea, 1 soda  Does not drink before bedtime.   UTIs:  0  UTI's in the last year.   Denies history of blood in urine and kidney or bladder stones  Pelvic Organ Prolapse Symptoms:                  She Admits to a feeling of a bulge the vaginal area. It has been present for 10 months.  She Denies seeing a bulge.  This bulge is bothersome.  Bowel Symptom: Bowel movements: 1 time(s) per day Stool consistency: soft  Straining: no.  Splinting: no.  Incomplete evacuation: no.  She Denies accidental bowel leakage / fecal incontinence- did have some issues after her delivery but this improved.  Bowel regimen: none  Sexual Function Sexually active: yes.  Pain with sex: No  Pelvic Pain Denies pelvic pain   Past Medical History:  Past Medical History:  Diagnosis Date   Anxiety    Darier disease    Depression    following miscarriage   Febrile seizure ([place])    Gestational diabetes    gestational   Infection    UTI    Ovarian cyst      Past Surgical History:   Past Surgical History:  Procedure Laterality Date   APPENDECTOMY     CHOLECYSTECTOMY     CHOLECYSTECTOMY     DILATATION AND CURETTAGE/HYSTEROSCOPY WITH MINERVA N/A 12/09/2020   Procedure: DILATATION AND CURETTAGE/HYSTEROSCOPY WITH ATTEMPTED  MINERVA  ENDOMETRIAL ABLATION;  Surgeon: [name], MD;  Location: [facility];  Service: Gynecology;  Laterality: N/A;   DILATION AND CURETTAGE OF UTERUS     for menorrhagia   DILATION AND CURETTAGE OF UTERUS N/A 10/07/2019   Procedure: SUCTION DILATATION AND CURETTAGE;  Surgeon: [name], MD;  Location: [facility];  Service: Gynecology;  Laterality: N/A;  Dr. Request Time 9:30am   LAPAROSCOPIC BILATERAL SALPINGECTOMY Bilateral 12/09/2020   Procedure: LAPAROSCOPIC BILATERAL SALPINGECTOMY;  Surgeon: [name], MD;  Location: [facility];  Service: Gynecology;  Laterality: Bilateral;   OVARIAN CYST SURGERY     x3   TONSILLECTOMY     TONSILLECTOMY  2007   WISDOM TOOTH EXTRACTION       Past OB/GYN History: OB History  Gravida Para Term Preterm AB Living  3 1 1   2 1   SAB IAB Ectopic Multiple Live Births  1   1   1     # Outcome Date GA Lbr Len/2nd Weight Sex Delivery Anes PTL Lv  3 Term 10/15/20  [redacted]w[redacted]d   M Vag-Spont   LIV  2 Ectopic 06/2018          1 SAB            Delivery was forcep delivery.  No LMP recorded (lmp unknown). (Menstrual status: IUD).  Contraception: Mirena IUD, tubal ligation Last pap smear was 2021-neg.     Medications: She has a current medication list which includes the following prescription(s): escitalopram, multivitamin-prenatal, hydrochlorothiazide, and meloxicam.   Allergies: Patient is allergic to acetaminophen, 5-alpha reductase inhibitors, sulfamethoxazole-trimethoprim, and tape.   Social History:  Social History   Tobacco Use   Smoking status: Never   Smokeless tobacco: Never  Vaping Use   Vaping Use: Never used  Substance Use Topics   Alcohol use: Not  Currently   Drug use: Not Currently    Relationship status: married She lives with husband.   She is not employed. Regular exercise: No History of abuse: No  Family History:   Family History  Problem Relation Age of Onset   Diabetes Mother    Hypertension Mother    Diabetes Father    Stroke Father    Cancer Maternal Grandmother        ovarian   Jaundice Son      Review of Systems: Review of Systems  Constitutional:  Negative for fever, malaise/fatigue and weight loss.  Respiratory:  Negative for cough, shortness of breath and wheezing.   Cardiovascular:  Negative for chest pain, palpitations and leg swelling.  Gastrointestinal:  Negative for abdominal pain and blood in stool.  Genitourinary:  Negative for dysuria.  Musculoskeletal:  Negative for myalgias.  Skin:  Negative for rash.  Neurological:  Negative for dizziness and headaches.  Endo/Heme/Allergies:  Does not bruise/bleed easily.  Psychiatric/Behavioral:  Negative for depression. The patient is not nervous/anxious.     OBJECTIVE Physical Exam: Vitals:   08/09/21 0854  BP: 118/84  Pulse: 80  Weight: 160 lb (72.6 kg)  Height: 5\' 1"  (1.549 m)    Physical Exam Constitutional:      General: She is not in acute distress. Pulmonary:     Effort: Pulmonary effort is normal.  Abdominal:     General: There is no distension.     Palpations: Abdomen is soft.     Tenderness: There is no abdominal tenderness. There is no rebound.  Musculoskeletal:        General: No swelling. Normal range of motion.  Skin:    General: Skin is warm and dry.     Findings: No rash.  Neurological:     Mental Status: She is alert and oriented to person, place, and time.  Psychiatric:        Mood and Affect: Mood normal.        Behavior: Behavior normal.     GU / Detailed Urogynecologic Evaluation:  Pelvic Exam: Normal external female genitalia; Bartholin's and Skene's glands normal in appearance; urethral meatus normal in  appearance, no urethral masses or discharge.   CST: negative   Speculum exam reveals normal vaginal mucosa without atrophy. Cervix normal appearance. Uterus normal single, nontender. Adnexa no mass, fullness, tenderness.    Pelvic floor strength I/V, puborectalis III/V external anal sphincter III/V  Pelvic floor musculature: Right levator non-tender, Right obturator non-tender, Left levator non-tender, Left obturator non-tender  POP-Q:   POP-Q  -1  Aa   -1                                           Ba  -8                                              C   5                                            Gh  3.5                                            Pb  10                                            tvl   0                                            Ap  0                                            Bp  -9.5                                              D     Rectal Exam:  Normal sphincter tone, small distal rectocele, enterocoele not present, no rectal masses, no sign of dyssynergia when asking the patient to bear down.  Post-Void Residual (PVR) by Bladder Scan: In order to evaluate bladder emptying, we discussed obtaining a postvoid residual and she agreed to this procedure.  Procedure: The ultrasound unit was placed on the patient's abdomen in the suprapubic region after the patient had voided. A PVR of 10 ml was obtained by bladder scan.  Laboratory Results: POC urine: small leukocytes, trace blood   ASSESSMENT AND PLAN Erin Dennis is a 41 y.o. with:  1. Prolapse of posterior vaginal wall   2. Prolapse of anterior vaginal wall   3. SUI (stress urinary incontinence, female)   4. Urinary frequency     Stage II anterior, Stage II posterior, Stage I apical prolapse - For treatment of pelvic organ prolapse, we discussed options for management including expectant management, conservative management, and surgical management,  such as Kegels, a pessary, pelvic floor physical therapy, and specific surgical procedures. - She is interested in surgery: Anterior and posterior repair with perineorrhpahy. Handouts provided.   2. SUI -For treatment of stress urinary incontinence,  non-surgical options include expectant management, weight loss, physical therapy, as well as a pessary.  Surgical options include  a midurethral sling, Burch urethropexy, and transurethral injection of a bulking agent. - She is interested in a sling at the time of prolapse repair. Will have her return for simple CMG test to demonstrate leakage.   3. Urgency/ frequency - not as often. Reviewed to avoid bladder irritants/.   Return for CMG testing   [name], MD   Time spent: I spent 50 minutes dedicated to the care of this patient on the date of this encounter to include pre-visit review of records, face-to-face time with the patient and post visit documentation and ordering medication/ testing.

## 2021-08-09 ENCOUNTER — Other Ambulatory Visit: Payer: Self-pay

## 2021-08-09 ENCOUNTER — Other Ambulatory Visit (HOSPITAL_COMMUNITY)
Admission: RE | Admit: 2021-08-09 | Discharge: 2021-08-09 | Disposition: A | Discharge status: Home / Self Care | Payer: Medicaid Other | Source: Ambulatory Visit | Attending: Obstetrics and Gynecology | Admitting: Obstetrics and Gynecology

## 2021-08-09 ENCOUNTER — Ambulatory Visit (INDEPENDENT_AMBULATORY_CARE_PROVIDER_SITE_OTHER): Payer: Medicaid Other | Admitting: Obstetrics and Gynecology

## 2021-08-09 ENCOUNTER — Encounter: Payer: Self-pay | Admitting: Obstetrics and Gynecology

## 2021-08-09 VITALS — BP 118/84 | HR 80 | Ht 61.0 in | Wt 160.0 lb

## 2021-08-09 DIAGNOSIS — R350 Frequency of micturition: Secondary | ICD-10-CM | POA: Insufficient documentation

## 2021-08-09 DIAGNOSIS — N393 Stress incontinence (female) (male): Secondary | ICD-10-CM | POA: Diagnosis not present

## 2021-08-09 DIAGNOSIS — N8110 Cystocele, unspecified: Secondary | ICD-10-CM

## 2021-08-09 DIAGNOSIS — N816 Rectocele: Secondary | ICD-10-CM | POA: Diagnosis not present

## 2021-08-09 LAB — POCT URINALYSIS DIPSTICK
Appearance: ABNORMAL
Bilirubin, UA: NEGATIVE
Glucose, UA: NEGATIVE
Ketones, UA: NEGATIVE
Nitrite, UA: NEGATIVE
Protein, UA: NEGATIVE
Spec Grav, UA: >=1.03 — AB (ref 1.010–1.025)
Urobilinogen, UA: 0.2 E.U./dL
pH, UA: 6 (ref 5.0–8.0)

## 2021-08-09 NOTE — Patient Instructions (Signed)

## 2021-08-10 LAB — URINE CULTURE: Culture: >=100000 — AB

## 2021-08-16 ENCOUNTER — Ambulatory Visit: Payer: Medicaid Other | Admitting: Obstetrics & Gynecology

## 2021-08-18 ENCOUNTER — Other Ambulatory Visit: Payer: Self-pay

## 2021-08-18 ENCOUNTER — Encounter: Payer: Self-pay | Admitting: Obstetrics and Gynecology

## 2021-08-18 ENCOUNTER — Ambulatory Visit (INDEPENDENT_AMBULATORY_CARE_PROVIDER_SITE_OTHER): Payer: Medicaid Other | Admitting: Obstetrics and Gynecology

## 2021-08-18 VITALS — BP 133/89 | HR 73

## 2021-08-18 DIAGNOSIS — N393 Stress incontinence (female) (male): Secondary | ICD-10-CM | POA: Diagnosis not present

## 2021-08-18 DIAGNOSIS — N816 Rectocele: Secondary | ICD-10-CM | POA: Diagnosis not present

## 2021-08-18 DIAGNOSIS — N8110 Cystocele, unspecified: Secondary | ICD-10-CM

## 2021-08-18 NOTE — Progress Notes (Signed)
[facility] Return Visit  SUBJECTIVE  History of Present Illness: Alexius Ellington is a 41 y.o. female seen in follow-up for simple CMG procedure and to discuss surgery.    Past Medical History: Patient  has a past medical history of Anxiety, Darier disease, Depression, Febrile seizure ([place]), Gestational diabetes, Infection, and Ovarian cyst.   Past Surgical History: She  has a past surgical history that includes Appendectomy; Cholecystectomy; Tonsillectomy; Wisdom tooth extraction; Ovarian cyst surgery; Dilation and curettage of uterus; Dilation and curettage of uterus (N/A, 10/07/2019); Tonsillectomy (2007); Laparoscopic bilateral salpingectomy (Bilateral, 12/09/2020); Dilatation and curettage/hysteroscopy with minerva (N/A, 12/09/2020); and Cholecystectomy.   Medications: She has a current medication list which includes the following prescription(s): escitalopram and ibuprofen.   Allergies: Patient is allergic to acetaminophen, 5-alpha reductase inhibitors, sulfamethoxazole-trimethoprim, and tape.   Social History: Patient  reports that she has never smoked. She has never used smokeless tobacco. She reports that she does not currently use alcohol. She reports that she does not currently use drugs.      OBJECTIVE     Physical Exam: Vitals:   08/18/21 1319  BP: 133/89  Pulse: 73   Gen: No apparent distress, A&O x 3.  Detailed Urogynecologic Evaluation:  Deferred. Prior exam showed:  POP-Q (08/09/21):    POP-Q   -1                                            Aa   -1                                           Ba   -8                                              C    5                                            Gh   3.5                                            Pb   10                                            tvl    0                                            Ap   0  Bp   -9.5                                               D        Verbal consent was obtained to perform simple CMG procedure:   Prolapse was reduced using 2 large cotton swabs. Urethra was prepped with betadine and a 81F catheter was placed and bladder was drained completely. The bladder was then backfilled with sterile water by gravity.  First sensation: 60 First Desire: 100 Strong Desire: 200 Capacity: 275 Cough stress test was positive. Valsalva stress test was not done. No evidence of DO. She was was allowed to void on her own.   Interpretation: CMG showed increased sensation, and normal cystometric capacity. Findings positive for stress incontinence, negative for detrusor overactivity.     ASSESSMENT AND PLAN    Ms. Haseman is a 41 y.o. with:  1. Prolapse of posterior vaginal wall   2. Prolapse of anterior vaginal wall   3. SUI (stress urinary incontinence, female)     Plan for surgery: Exam under anesthesia, anterior and posterior repair with perineorrhaphy, midurethral sling, cystoscopy  - We reviewed the patient's specific anatomic and functional findings, with the assistance of diagrams, and together finalized the above procedure. The planned surgical procedures were discussed along with the surgical risks outlined below, which were also provided on a detailed handout. Additional treatment options including expectant management, conservative management, medical management were discussed where appropriate.  We reviewed the benefits and risks of each treatment option.   General Surgical Risks: For all procedures, there are risks of bleeding, infection, damage to surrounding organs including but not limited to bowel, bladder, blood vessels, ureters and nerves, and need for further surgery if an injury were to occur. These risks are all low with minimally invasive surgery.   There are risks of numbness and weakness at any body site or buttock/rectal pain.  It is possible that baseline pain can be worsened by surgery,  either with or without mesh. If surgery is vaginal, there is also a low risk of possible conversion to laparoscopy or open abdominal incision where indicated. Very rare risks include blood transfusion, blood clot, heart attack, pneumonia, or death.   There is also a risk of short-term postoperative urinary retention with need to use a catheter. About half of patients need to go home from surgery with a catheter, which is then later removed in the office. The risk of long-term need for a catheter is very low. There is also a risk of worsening of overactive bladder.   Sling: The effectiveness of a midurethral vaginal mesh sling is approximately 85%, and thus, there will be times when you may leak urine after surgery, especially if your bladder is full or if you have a strong cough. There is a balance between making the sling tight enough to treat your leakage but not too tight so that you have long-term difficulty emptying your bladder. A mesh sling will not directly treat overactive bladder/urge incontinence and may worsen it.  There is an FDA safety notification on vaginal mesh procedures for prolapse but NOT mesh slings. We have extensive experience and training with mesh placement and we have close postoperative follow up to identify any potential complications from mesh. It is important to realize that this mesh is a permanent implant that cannot be  easily removed. There are rare risks of mesh exposure (2-4%), pain with intercourse (0-7%), and infection (<1%). The risk of mesh exposure if more likely in a woman with risks for poor healing (prior radiation, poorly controlled diabetes, or immunocompromised). The risk of new or worsened chronic pain after mesh implant is more common in women with baseline chronic pain and/or poorly controlled anxiety or depression. Approximately 2-4% of patients will experience longer-term post-operative voiding dysfunction that may require surgical revision of the sling. We  also reviewed that postoperatively, her stream may not be as strong as before surgery.   Prolapse (with or without mesh): Risk factors for surgical failure  include things that put pressure on your pelvis and the surgical repair, including obesity, chronic cough, and heavy lifting or straining (including lifting children or adults, straining on the toilet, or lifting heavy objects such as furniture or anything weighing >25 lbs. Risks of recurrence is 20-30% with vaginal native tissue repair and a less than 10% with sacrocolpopexy with mesh.     - For preop Visit:  She is required to have a visit within 30 days of her surgery.   Today we reviewed pre-operative preparation, peri-operative expectations, and post-operative instructions/recovery.  She was provided with instructional handouts. She understands not to take aspirin (>81mg ) or NSAIDs 7 days prior to surgery. Prescriptions will be provided for: Oxycodone 5mg , Ibuprofen 600mg ,  Miralax. These prescriptions will be sent prior to surgery.  - Medical clearance: not required  - Anticoagulant use: No - Medicaid Hysterectomy form: No - Accepts blood transfusion: Yes - Expected length of stay: outpatient  Request sent for surgery scheduling.   [name], MD  Time spent: I spent 20 minutes dedicated to the care of this patient on the date of this encounter to include pre-visit review of records, face-to-face time with the patient and post visit documentation. Additional time was spent on the procedure.

## 2021-08-18 NOTE — Patient Instructions (Addendum)
General Surgical Risks: For all procedures, there are risks of bleeding, infection, damage to surrounding organs including but not limited to bowel, bladder, blood vessels, ureters and nerves, and need for further surgery if an injury were to occur. These risks are all low with minimally invasive surgery.   There are risks of numbness and weakness at any body site or buttock/rectal pain.  It is possible that baseline pain can be worsened by surgery, either with or without mesh. If surgery is vaginal, there is also a low risk of possible conversion to laparoscopy or open abdominal incision where indicated. Very rare risks include blood transfusion, blood clot, heart attack, pneumonia, or death.   There is also a risk of short-term postoperative urinary retention with need to use a catheter. About half of patients need to go home from surgery with a catheter, which is then later removed in the office. The risk of long-term need for a catheter is very low. There is also a risk of worsening of overactive bladder.   Sling: The effectiveness of a midurethral vaginal mesh sling is approximately 85%, and thus, there will be times when you may leak urine after surgery, especially if your bladder is full or if you have a strong cough. There is a balance between making the sling tight enough to treat your leakage but not too tight so that you have long-term difficulty emptying your bladder. A mesh sling will not directly treat overactive bladder/urge incontinence and may worsen it.  There is an FDA safety notification on vaginal mesh procedures for prolapse but NOT mesh slings. We have extensive experience and training with mesh placement and we have close postoperative follow up to identify any potential complications from mesh. It is important to realize that this mesh is a permanent implant that cannot be easily removed. There are rare risks of mesh exposure (2-4%), pain with intercourse (0-7%), and infection  (<1%). The risk of mesh exposure if more likely in a woman with risks for poor healing (prior radiation, poorly controlled diabetes, or immunocompromised). The risk of new or worsened chronic pain after mesh implant is more common in women with baseline chronic pain and/or poorly controlled anxiety or depression. Approximately 2-4% of patients will experience longer-term post-operative voiding dysfunction that may require surgical revision of the sling. We also reviewed that postoperatively, her stream may not be as strong as before surgery.   Prolapse (with or without mesh): Risk factors for surgical failure  include things that put pressure on your pelvis and the surgical repair, including obesity, chronic cough, and heavy lifting or straining (including lifting children or adults, straining on the toilet, or lifting heavy objects such as furniture or anything weighing >25 lbs. Risks of recurrence is 20-30% with vaginal native tissue repair and a less than 10% with sacrocolpopexy with mesh.    POST OPERATIVE INSTRUCTIONS  General Instructions Recovery (not bed rest) will last approximately 6 weeks Walking is encouraged, but refrain from strenuous exercise/ housework/ heavy lifting. No lifting >10lbs  Nothing in the vagina- NO intercourse, tampons or douching Bathing:  Do not submerge in water (NO swimming, bath, hot tub, etc) until after your postop visit. You can shower starting the day after surgery.  No driving until you are not taking narcotic pain medicine and until your pain is well enough controlled that you can slam on the breaks or make sudden movements if needed.   Taking your medications Please take your ibuprofen on a schedule for the first  48 hours. Take 600mg  ibuprofen every 6 hours. Take the prescribed narcotic (oxycodone, tramadol, etc) as needed, with a maximum being every 4 hours.  Take a stool softener daily to keep your stools soft and preventing you from straining. If you  have diarrhea, you decrease your stool softener. This is explained more below. We have prescribed you Miralax.  Reasons to Call the Nurse (see last page for phone numbers) Heavy Bleeding (changing your pad every 1-2 hours) Persistent nausea/vomiting Fever (100.4 degrees or more) Incision problems (pus or other fluid coming out, redness, warmth, increased pain)  Things to Expect After Surgery Mild to Moderate pain is normal during the first day or two after surgery. If prescribed, take Ibuprofen or Tylenol first and use the stronger medicine for break-through pain. You can overlap these medicines because they work differently.   Constipation   To Prevent Constipation:  Eat a well-balanced diet including protein, grains, fresh fruit and vegetables.  Drink plenty of fluids. Walk regularly.  Depending on specific instructions from your physician: take Miralax daily and additionally you can add a stool softener (colace/ docusate) and fiber supplement. Continue as long as you're on pain medications.   To Treat Constipation:  If you do not have a bowel movement in 2 days after surgery, you can take 2 Tbs of Milk of Magnesia 1-2 times a day until you have a bowel movement. If diarrhea occurs, decrease the amount or stop the laxative. If no results with Milk of Magnesia, you can drink a bottle of magnesium citrate which you can purchase over the counter.  Fatigue:  This is a normal response to surgery and will improve with time.  Plan frequent rest periods throughout the day.  Gas Pain:  This is very common but can also be very painful! Drink warm liquids such as herbal teas, bouillon or soup. Walking will help you pass more gas.  Mylicon or Gas-X can be taken over the counter.  Leaking Urine:  Varying amounts of leakage may occur after surgery.  This should improve with time. Your bladder needs at least 3 months to recover from surgery. If you leak after surgery, be sure to mention this to your doctor  at your post-op visit. If you were taking medications for overactive bladder prior to surgery, be sure to restart the medications immediately after surgery.  Incisions: If you have incisions on your abdomen, the skin glue will dissolve on its own over time. It is ok to gently rinse with soap and water over these incisions but do not scrub.  Catheter Approximately 50% of patients are unable to urinate after surgery and need to go home with a catheter. This allows your bladder to rest so it can return to full function. If you go home with a catheter, the office will call to set up a voiding trial a few days after surgery. For most patients, by this visit, they are able to urinate on their own. Long term catheter use is rare.   Return to Work  As work demands and recovery times vary widely, it is hard to predict when you will want to return to work. If you have a desk job with no strenuous physical activity, and if you would like to return sooner than generally recommended, discuss this with your provider or call our office.   Post op concerns  For non-emergent issues, please call the Urogynecology Nurse. Please leave a message and someone will contact you within one business day.  You can also send a message through [facility].   AFTER HOURS (After 5:00 PM and on weekends):  For urgent matters that cannot wait until the next business day. Call our office [phone] and connect to the doctor on call.  Please reserve this for important issues.   **FOR ANY TRUE EMERGENCY ISSUES CALL 911 OR GO TO THE NEAREST EMERGENCY ROOM.** Please inform our office or the doctor on call of any emergency.     APPOINTMENTS: Call [phone]

## 2021-08-20 ENCOUNTER — Ambulatory Visit: Payer: Medicaid Other | Admitting: Obstetrics and Gynecology

## 2021-08-24 ENCOUNTER — Ambulatory Visit: Payer: Medicaid Other | Admitting: Obstetrics and Gynecology

## 2021-08-31 ENCOUNTER — Encounter (HOSPITAL_BASED_OUTPATIENT_CLINIC_OR_DEPARTMENT_OTHER): Payer: Self-pay | Admitting: Obstetrics and Gynecology

## 2021-08-31 ENCOUNTER — Other Ambulatory Visit: Payer: Self-pay

## 2021-08-31 NOTE — Progress Notes (Signed)
Spoke w/ via phone for pre-op interview--- [facility]----   UPT            Lab results------Current EKG in Epic dated 11/2020 COVID test -----patient states asymptomatic no test needed Arrive at -------1200 NPO after MN NO Solid Food.  Clear liquids from MN until---1100 Med rec completed Medications to take morning of surgery -----Lexapro and Accutane Diabetic medication ----- Patient instructed no nail polish to be worn day of surgery Patient instructed to bring photo id and insurance card day of surgery Patient aware to have Driver (ride ) / caregiver Husband or mother Erin Dennis or Erin Dennis    for 24 hours after surgery  Patient Special Instructions ----- Pre-Op special Istructions ----- Patient verbalized understanding of instructions that were given at this phone interview. Patient denies shortness of breath, chest pain, fever, cough at this phone interview.

## 2021-09-02 ENCOUNTER — Other Ambulatory Visit: Payer: Self-pay | Admitting: Obstetrics and Gynecology

## 2021-09-02 DIAGNOSIS — Z01818 Encounter for other preprocedural examination: Secondary | ICD-10-CM

## 2021-09-02 MED ORDER — IBUPROFEN 600 MG PO TABS: 600.0000 mg | ORAL_TABLET | Freq: Four times a day (QID) | ORAL | 0 refills | Status: DC | PRN

## 2021-09-02 MED ORDER — POLYETHYLENE GLYCOL 3350 17 GM/SCOOP PO POWD: 17.0000 g | Freq: Every day | ORAL | 0 refills | Status: DC

## 2021-09-02 MED ORDER — OXYCODONE HCL 5 MG PO TABS: 5.0000 mg | ORAL_TABLET | ORAL | 0 refills | Status: DC | PRN

## 2021-09-02 NOTE — H&P (Signed)
[facility] Pre-Operative H&P  Subjective Chief Complaint: Erin Dennis presents for a preoperative encounter.   History of Present Illness: Erin Dennis is a 41 y.o. female who presents for preoperative visit.  She is scheduled to undergo Exam under anesthesia, anterior and posterior repair with perineorrhaphy, midurethral sling, cystoscopy on 09/06/21.  Her symptoms include vaginal bulge and urinary leakage, and she was was found to have Stage II anterior, Stage II posterior, Stage I apical prolapse.  CMG showed increased sensation, and normal cystometric capacity. Findings positive for stress incontinence, negative for detrusor overactivity.    Past Medical History:  Diagnosis Date   Anxiety    Darier disease    Depression    following miscarriage   Febrile seizure ([place])    Infection    UTI   Ovarian cyst    Personal history of gestational diabetes 2022     Past Surgical History:  Procedure Laterality Date   APPENDECTOMY     CHOLECYSTECTOMY     CHOLECYSTECTOMY     DILATATION AND CURETTAGE/HYSTEROSCOPY WITH MINERVA N/A 12/09/2020   Procedure: DILATATION AND CURETTAGE/HYSTEROSCOPY WITH ATTEMPTED  MINERVA  ENDOMETRIAL ABLATION;  Surgeon: [name], MD;  Location: [facility];  Service: Gynecology;  Laterality: N/A;   DILATION AND CURETTAGE OF UTERUS     for menorrhagia   DILATION AND CURETTAGE OF UTERUS N/A 10/07/2019   Procedure: SUCTION DILATATION AND CURETTAGE;  Surgeon: [name], MD;  Location: [facility];  Service: Gynecology;  Laterality: N/A;  Dr. Request Time 9:30am   LAPAROSCOPIC BILATERAL SALPINGECTOMY Bilateral 12/09/2020   Procedure: LAPAROSCOPIC BILATERAL SALPINGECTOMY;  Surgeon: [name], MD;  Location: [facility];  Service: Gynecology;  Laterality: Bilateral;   OVARIAN CYST SURGERY     x3   TONSILLECTOMY     TONSILLECTOMY  2007   WISDOM TOOTH EXTRACTION      is allergic to acetaminophen, 5-alpha reductase inhibitors,  sulfamethoxazole-trimethoprim, and tape.   Family History  Problem Relation Age of Onset   Diabetes Mother    Hypertension Mother    Diabetes Father    Stroke Father    Cancer Maternal Grandmother        ovarian   Jaundice Son     Social History   Tobacco Use   Smoking status: Never   Smokeless tobacco: Never  Vaping Use   Vaping Use: Never used  Substance Use Topics   Alcohol use: Not Currently   Drug use: Not Currently     Review of Systems was negative for a full 10 system review except as noted in the History of Present Illness.  No current facility-administered medications for this encounter.  Current Outpatient Medications:    escitalopram (LEXAPRO) 10 MG tablet, TAKE 1 TABLET BY MOUTH EVERY DAY, Disp: 90 tablet, Rfl: 2   ISOtretinoin (ACCUTANE) 40 MG capsule, Take 40 mg by mouth 2 (two) times daily., Disp: , Rfl:    ibuprofen (ADVIL) 800 MG tablet, Take 800 mg by mouth every 8 (eight) hours as needed., Disp: , Rfl:    Objective   08/18/21 1319  BP: 133/89  Pulse: 73    Gen: No apparent distress, A&O x 3.  Previous Pelvic Exam showed: POP-Q (08/09/21):    POP-Q   -1  Aa   -1                                           Ba   -8                                              C    5                                            Gh   3.5                                            Pb   10                                            tvl    0                                            Ap   0                                            Bp   -9.5                                              D        Assessment/ Plan  Assessment: The patient is a 41 y.o. year old scheduled to undergo Exam under anesthesia, anterior and posterior repair with perineorrhaphy, midurethral sling, cystoscopy.   [name], MD

## 2021-09-03 NOTE — Progress Notes (Signed)
Lm on the VM letting the patient know her medication has been sent to the pharmacy

## 2021-09-06 ENCOUNTER — Encounter (HOSPITAL_BASED_OUTPATIENT_CLINIC_OR_DEPARTMENT_OTHER): Payer: Self-pay | Admitting: Obstetrics and Gynecology

## 2021-09-06 ENCOUNTER — Ambulatory Visit (HOSPITAL_BASED_OUTPATIENT_CLINIC_OR_DEPARTMENT_OTHER): Payer: Medicaid Other | Admitting: Anesthesiology

## 2021-09-06 ENCOUNTER — Ambulatory Visit (HOSPITAL_BASED_OUTPATIENT_CLINIC_OR_DEPARTMENT_OTHER)
Admission: RE | Admit: 2021-09-06 | Discharge: 2021-09-06 | Disposition: A | Discharge status: Home / Self Care | Payer: Medicaid Other | Attending: Obstetrics and Gynecology | Admitting: Obstetrics and Gynecology

## 2021-09-06 ENCOUNTER — Encounter (HOSPITAL_BASED_OUTPATIENT_CLINIC_OR_DEPARTMENT_OTHER)
Admission: RE | Disposition: A | Discharge status: Home / Self Care | Payer: Self-pay | Source: Home / Self Care | Attending: Obstetrics and Gynecology

## 2021-09-06 DIAGNOSIS — F32A Depression, unspecified: Secondary | ICD-10-CM | POA: Insufficient documentation

## 2021-09-06 DIAGNOSIS — N393 Stress incontinence (female) (male): Secondary | ICD-10-CM | POA: Insufficient documentation

## 2021-09-06 DIAGNOSIS — N816 Rectocele: Secondary | ICD-10-CM

## 2021-09-06 DIAGNOSIS — N8110 Cystocele, unspecified: Secondary | ICD-10-CM

## 2021-09-06 DIAGNOSIS — F419 Anxiety disorder, unspecified: Secondary | ICD-10-CM | POA: Diagnosis not present

## 2021-09-06 DIAGNOSIS — Z01818 Encounter for other preprocedural examination: Secondary | ICD-10-CM

## 2021-09-06 HISTORY — PX: CYSTOSCOPY: SHX5120

## 2021-09-06 HISTORY — PX: ANTERIOR AND POSTERIOR REPAIR WITH SACROSPINOUS FIXATION: SHX6536

## 2021-09-06 HISTORY — PX: BLADDER SUSPENSION: SHX72

## 2021-09-06 LAB — TYPE AND SCREEN
ABO/RH(D): O POS
Antibody Screen: NEGATIVE

## 2021-09-06 LAB — POCT PREGNANCY, URINE: Preg Test, Ur: NEGATIVE

## 2021-09-06 SURGERY — ANTERIOR AND POSTERIOR REPAIR WITH SACROSPINOUS FIXATION
Anesthesia: General | Site: Vagina

## 2021-09-06 MED ORDER — DEXAMETHASONE SODIUM PHOSPHATE 10 MG/ML IJ SOLN
INTRAMUSCULAR | Status: AC
  Filled 2021-09-06: qty 1

## 2021-09-06 MED ORDER — LIDOCAINE HCL (CARDIAC) PF 100 MG/5ML IV SOSY
PREFILLED_SYRINGE | INTRAVENOUS | Status: DC | PRN
  Administered 2021-09-06: 80 mg via INTRAVENOUS

## 2021-09-06 MED ORDER — 0.9 % SODIUM CHLORIDE (POUR BTL) OPTIME
TOPICAL | Status: DC | PRN
Start: 2021-09-06 — End: 2021-09-06
  Administered 2021-09-06: 500 mL

## 2021-09-06 MED ORDER — ONDANSETRON HCL 4 MG/2ML IJ SOLN
INTRAMUSCULAR | Status: DC | PRN
  Administered 2021-09-06: 4 mg via INTRAVENOUS

## 2021-09-06 MED ORDER — FENTANYL CITRATE (PF) 100 MCG/2ML IJ SOLN
INTRAMUSCULAR | Status: AC
  Filled 2021-09-06: qty 2

## 2021-09-06 MED ORDER — ONDANSETRON HCL 4 MG/2ML IJ SOLN
INTRAMUSCULAR | Status: AC
  Filled 2021-09-06: qty 2

## 2021-09-06 MED ORDER — FENTANYL CITRATE (PF) 100 MCG/2ML IJ SOLN
INTRAMUSCULAR | Status: DC | PRN
  Administered 2021-09-06: 25 ug via INTRAVENOUS
  Administered 2021-09-06: 50 ug via INTRAVENOUS
  Administered 2021-09-06: 25 ug via INTRAVENOUS
  Administered 2021-09-06: 100 ug via INTRAVENOUS

## 2021-09-06 MED ORDER — SODIUM CHLORIDE 0.9 % IR SOLN
Status: DC | PRN
  Administered 2021-09-06: 300 mL via INTRAVESICAL

## 2021-09-06 MED ORDER — LIDOCAINE HCL (PF) 2 % IJ SOLN
INTRAMUSCULAR | Status: AC
  Filled 2021-09-06: qty 5

## 2021-09-06 MED ORDER — CEFAZOLIN SODIUM-DEXTROSE 2-4 GM/100ML-% IV SOLN
INTRAVENOUS | Status: AC
  Filled 2021-09-06: qty 100

## 2021-09-06 MED ORDER — PROPOFOL 10 MG/ML IV BOLUS
INTRAVENOUS | Status: DC | PRN
  Administered 2021-09-06: 150 mg via INTRAVENOUS

## 2021-09-06 MED ORDER — PHENAZOPYRIDINE HCL 100 MG PO TABS
ORAL_TABLET | ORAL | Status: AC
  Filled 2021-09-06: qty 2

## 2021-09-06 MED ORDER — LIDOCAINE-EPINEPHRINE 1 %-1:100000 IJ SOLN
INTRAMUSCULAR | Status: DC | PRN
  Administered 2021-09-06: 18 mL

## 2021-09-06 MED ORDER — PROPOFOL 10 MG/ML IV BOLUS
INTRAVENOUS | Status: AC
  Filled 2021-09-06: qty 20

## 2021-09-06 MED ORDER — PHENAZOPYRIDINE HCL 100 MG PO TABS
200.0000 mg | ORAL_TABLET | ORAL | Status: AC
  Administered 2021-09-06: 200 mg via ORAL

## 2021-09-06 MED ORDER — SCOPOLAMINE 1 MG/3DAYS TD PT72: 1.0000 | MEDICATED_PATCH | Freq: Once | TRANSDERMAL | Status: DC

## 2021-09-06 MED ORDER — MIDAZOLAM HCL 2 MG/2ML IJ SOLN
INTRAMUSCULAR | Status: DC | PRN
  Administered 2021-09-06: 2 mg via INTRAVENOUS

## 2021-09-06 MED ORDER — DEXAMETHASONE SODIUM PHOSPHATE 10 MG/ML IJ SOLN
INTRAMUSCULAR | Status: DC | PRN
  Administered 2021-09-06: 10 mg via INTRAVENOUS

## 2021-09-06 MED ORDER — KETOROLAC TROMETHAMINE 30 MG/ML IJ SOLN
INTRAMUSCULAR | Status: DC | PRN
Start: 2021-09-06 — End: 2021-09-06
  Administered 2021-09-06: 30 mg via INTRAVENOUS

## 2021-09-06 MED ORDER — LACTATED RINGERS IV SOLN: INTRAVENOUS | Status: DC | PRN

## 2021-09-06 MED ORDER — PHENYLEPHRINE HCL (PRESSORS) 10 MG/ML IV SOLN
INTRAVENOUS | Status: DC | PRN
  Administered 2021-09-06 (×3): 80 ug via INTRAVENOUS
  Administered 2021-09-06: 120 ug via INTRAVENOUS
  Administered 2021-09-06: 80 ug via INTRAVENOUS

## 2021-09-06 MED ORDER — CEFAZOLIN SODIUM-DEXTROSE 2-3 GM-%(50ML) IV SOLR
INTRAVENOUS | Status: DC | PRN
  Administered 2021-09-06: 2 g via INTRAVENOUS

## 2021-09-06 MED ORDER — MIDAZOLAM HCL 2 MG/2ML IJ SOLN
INTRAMUSCULAR | Status: AC
  Filled 2021-09-06: qty 2

## 2021-09-06 MED ORDER — CEFAZOLIN SODIUM-DEXTROSE 2-4 GM/100ML-% IV SOLN: 2.0000 g | INTRAVENOUS | Status: DC

## 2021-09-06 MED ORDER — LACTATED RINGERS IV SOLN: INTRAVENOUS | Status: DC

## 2021-09-06 MED ORDER — PHENYLEPHRINE 40 MCG/ML (10ML) SYRINGE FOR IV PUSH (FOR BLOOD PRESSURE SUPPORT)
PREFILLED_SYRINGE | INTRAVENOUS | Status: AC
  Filled 2021-09-06: qty 40

## 2021-09-06 SURGICAL SUPPLY — 43 items
APPLICATOR SURGIFLO ENDO (HEMOSTASIS) IMPLANT
BLADE CLIPPER SENSICLIP SURGIC (BLADE) ×3 IMPLANT
BLADE SURG 15 STRL LF DISP TIS (BLADE) ×2 IMPLANT
BLADE SURG 15 STRL SS (BLADE) ×1
DECANTER SPIKE VIAL GLASS SM (MISCELLANEOUS) ×3 IMPLANT
DERMABOND ADVANCED (GAUZE/BANDAGES/DRESSINGS) ×1
DERMABOND ADVANCED .7 DNX12 (GAUZE/BANDAGES/DRESSINGS) ×2 IMPLANT
DEVICE CAPIO SLIM SINGLE (INSTRUMENTS) IMPLANT
ELECT REM PT RETURN 9FT ADLT (ELECTROSURGICAL) ×3
ELECTRODE REM PT RTRN 9FT ADLT (ELECTROSURGICAL) IMPLANT
GAUZE 4X4 16PLY ~~LOC~~+RFID DBL (SPONGE) ×3 IMPLANT
GLOVE SURG ENC MOIS LTX SZ6 (GLOVE) ×3 IMPLANT
GLOVE SURG UNDER POLY LF SZ6.5 (GLOVE) ×3 IMPLANT
GOWN STRL REUS W/TWL LRG LVL3 (GOWN DISPOSABLE) ×3 IMPLANT
HIBICLENS CHG 4% 4OZ BTL (MISCELLANEOUS) ×3 IMPLANT
HOLDER FOLEY CATH W/STRAP (MISCELLANEOUS) ×3 IMPLANT
KIT TURNOVER CYSTO (KITS) ×3 IMPLANT
MANIFOLD NEPTUNE II (INSTRUMENTS) ×3 IMPLANT
NDL MAYO 6 CRC TAPER PT (NEEDLE) IMPLANT
NEEDLE HYPO 22GX1.5 SAFETY (NEEDLE) ×3 IMPLANT
NEEDLE MAYO 6 CRC TAPER PT (NEEDLE) IMPLANT
NS IRRIG 1000ML POUR BTL (IV SOLUTION) ×3 IMPLANT
NS IRRIG 500ML POUR BTL (IV SOLUTION) ×1 IMPLANT
PACK CYSTO (CUSTOM PROCEDURE TRAY) ×3 IMPLANT
PACK VAGINAL WOMENS (CUSTOM PROCEDURE TRAY) ×3 IMPLANT
RETRACTOR LONE STAR DISPOSABLE (INSTRUMENTS) ×3 IMPLANT
RETRACTOR STAY HOOK 5MM (MISCELLANEOUS) ×3 IMPLANT
SET IRRIG Y TYPE TUR BLADDER L (SET/KITS/TRAYS/PACK) ×3 IMPLANT
SUCTION FRAZIER HANDLE 10FR (MISCELLANEOUS) ×1
SUCTION TUBE FRAZIER 10FR DISP (MISCELLANEOUS) ×2 IMPLANT
SURGIFLO W/THROMBIN 8M KIT (HEMOSTASIS) IMPLANT
SUT ABS MONO DBL WITH NDL 48IN (SUTURE) IMPLANT
SUT MON AB 2-0 SH 27 (SUTURE) IMPLANT
SUT VIC AB 0 CT1 27 (SUTURE) ×1
SUT VIC AB 0 CT1 27XBRD ANTBC (SUTURE) IMPLANT
SUT VIC AB 2-0 SH 27 (SUTURE) ×2
SUT VIC AB 2-0 SH 27XBRD (SUTURE) ×2 IMPLANT
SUT VICRYL 2-0 SH 8X27 (SUTURE) ×3 IMPLANT
SYR BULB EAR ULCER 3OZ GRN STR (SYRINGE) ×3 IMPLANT
SYS SLING ADV FIT BLUE TRNSVAG (Sling) ×1 IMPLANT
TOWEL OR 17X26 10 PK STRL BLUE (TOWEL DISPOSABLE) ×3 IMPLANT
TRAY FOLEY W/BAG SLVR 14FR (SET/KITS/TRAYS/PACK) ×3 IMPLANT
TRAY FOLEY W/BAG SLVR 14FR LF (SET/KITS/TRAYS/PACK) ×3 IMPLANT

## 2021-09-06 NOTE — Interval H&P Note (Signed)
History and Physical Interval Note:  09/06/2021 11:35 AM  Erin Dennis  has presented today for surgery, with the diagnosis of anterior prolapse, posterior prolapse, stress urinary incontinence.  The various methods of treatment have been discussed with the patient and family. After consideration of risks, benefits and other options for treatment, the patient has consented to  Procedure(s): ANTERIOR AND POSTERIOR REPAIR WITH PERINEORRHAPHY (N/A) TRANSVAGINAL TAPE (TVT) PROCEDURE (N/A) CYSTOSCOPY (N/A) as a surgical intervention.    Vitals:   09/06/21 1103  BP: 139/87  Pulse: 63  Resp: 16  Temp: 97.7 F (36.5 C)  SpO2: 100%    Gen: NAD Lungs: normal respirations Abd: soft, nontender   The patient's history has been reviewed, patient examined, no change in status, stable for surgery.  I have reviewed the patient's chart and labs.  Questions were answered to the patient's satisfaction.     [name]

## 2021-09-06 NOTE — Op Note (Signed)
Operative Note  Preoperative Diagnosis: anterior vaginal prolapse, posterior vaginal prolapse, and stress urinary incontinence  Postoperative Diagnosis: same  Procedures performed:  Anterior and posterior repair with perineorrhaphy, midurethral sling, cystoscopy  Implants:  Implant Name Type Inv. Item Serial No. Manufacturer Lot No. LRB No. Used Action  SYS SLING ADV FIT BLUE TRNSVAG - [id number] Sling SYS SLING ADV FIT BLUE TRNSVAG  [place] [id number] N/A 1 Implanted    Attending Surgeon: Sherlene Shams, MD   Anesthesia: General LMA  Findings: 1. On vaginal exam, stage 2 anterior and posterior prolapse noted  2. On cystoscopy, normal bladder  and urethra without injury, lesion or foreign body. Brisk bilateral ureteral efflux noted.   Specimens: none  Estimated blood loss: 75 mL  IV fluids: 500 mL  Urine output: 262 mL  Complications: none  Procedure in Detail: After informed consent was obtained, the patient was taken to the operating room where anesthesia was induced and found to be adequate. She was placed in dorsal lithotomy position, taking care to avoid any traction on the extremities, and then prepped and draped in the usual sterile fashion. A self-retaining lonestar retractor was placed using four elastic blue stays.  After a foley catheter was inserted into the urethra, the location of the midurethra was palpated. Two Allis clamps were along the anterior vaginal wall defect. 1% lidocaine with epinephrine was injected into the vaginal mucosa.  A vertical incision was made between these two Allis clamps with a 15 blade scalpel.  Allis clamps were placed along this incision and Metzenbaum scissors were used to undermine the vaginal mucosa along the incision.  The vaginal mucosa was then sharply dissected off to the vesicovaginal septum bilaterally to the level of the pubic rami.     Anterior plication of the vesicovaginal septum was then performed using plicating  sutures of 2-0 Vicryl. The vaginal mucosal edges were trimmed and the incision reapproximated with 2-0 Vicryl in a running fashion.   The retropubic midurethral sling was performed next. Two Allis clamps were placed lateral to the location of the midurethra. 1% lidocaine with epinephrine was injected into the vaginal mucosa and laterally.  Using a 15 blade scapel a small vertical incision was made in the vaginal mucosa. Metzenbaum scissors were used to dissect the vaginal mucosa off of the underlying muscularis and to create the periurethral tunnels. The trocar and attached sling were introduced into the right side of the vaginal incision, just inferior to the pubic symphysis on the right side. The trocar was guided through the endopelvic fascia and directly behind the pubic symphysis through the retropubic space, vertically.  The trocar was guided out through the suprapubic region, 2 fingerbreadths lateral to midline at the level of the pubic symphysis on the ipsilateral side. The trocar was similarly placed on the left side.  The Foley catheter was removed.  A 70-degree cystoscope was introduced, and 360-degree inspection revealed no trauma or trocars in the bladder, with bilateral ureteral efflux.  The bladder was drained and the cystoscope was removed.  The Foley catheter was reinserted. The sling was brought to lie beneath the mid-urethra.  An empty needle driver was placed behind the sling to ensure a tension free placement. The trocars were removed.  The plastic sheath was removed from the sling and the distal ends of the sling were trimmed just below the level of the skin incisions. The skin incisions were closed with dermabond.  Hemostasis was noted.  Tension-free positioning of the sling was  confirmed.  The vaginal incision was closed with 2-0 vicryl.  Attention was then turned to the posterior vagina.  Two Allis clamps were in the midline of the posterior vaginal wall defect.  1% lidocaine with  epinephrine was injected into the vaginal mucosa. A vertical incision was made between these clamps with a 15 blade scalpel and a diamond shaped incision was made over the perineum. The perineal skin was removed with the scalpel.   The rectovaginal septum was then dissected off the vaginal mucosa bilaterally.  No enterocele was noted.  The rectovaginal septum was then plicated with plicating sutures of 2-0 Vicryl.  After placement of the first plication stitch two fingers were inserted into the vaginal to confirm adequate caliber.  The last distal stitch incorporated the perineal body in a U stitch fashion. After plication, the excess vaginal mucosa was trimmed and the vaginal mucosa was reapproximated using 2-0 Vicryl sutures in a running fashion.  The perineal body was reapproximated using interrupted stitches of 0- Vicryl and the hymenal ring was redeveloped.  The vagina was copiously irrigated. The perineal skin was then closed with a 2-0 vicryl suture in subcutaneous and subcuticular fashion. Irrigation was performed. An additional 2-0 vicryl figure of eight suture was placed on both the anterior and posterior suture lines to obtain hemostasis.   Vaginal packing was not placed.  A rectal examination was normal and confirmed no sutures within the rectum.  The patient tolerated the procedure well.  She was awakened from anesthesia and transferred to the recovery room in stable condition. All counts were correct x 2.    [name], MD

## 2021-09-06 NOTE — Transfer of Care (Signed)
Immediate Anesthesia Transfer of Care Note  Patient: Environmental health practitioner  Procedure(s) Performed: ANTERIOR AND POSTERIOR REPAIR WITH PERINEORRHAPHY (Vagina ) TRANSVAGINAL TAPE (TVT) PROCEDURE (Vagina ) CYSTOSCOPY (Bladder)  Patient Location: PACU  Anesthesia Type:General  Level of Consciousness: awake, alert  and oriented  Airway & Oxygen Therapy: Patient Spontanous Breathing and Patient connected to face mask oxygen  Post-op Assessment: Report given to RN and Post -op Vital signs reviewed and stable  Post vital signs: Reviewed and stable  Last Vitals:  Vitals Value Taken Time  BP 117/93 09/06/21 1331  Temp 36.5 C 09/06/21 1330  Pulse 82 09/06/21 1333  Resp 20 09/06/21 1333  SpO2 98 % 09/06/21 1333  Vitals shown include unvalidated device data.  Last Pain:  Vitals:   09/06/21 1103  TempSrc: Oral  PainSc: 0-No pain      Patients Stated Pain Goal: 5 (34/14/43 6016)  Complications: No notable events documented.

## 2021-09-06 NOTE — Anesthesia Procedure Notes (Signed)
Procedure Name: LMA Insertion Date/Time: 09/06/2021 1:35 PM Performed by: Verita Lamb, CRNA Pre-anesthesia Checklist: Patient identified, Emergency Drugs available, Suction available and Patient being monitored Patient Re-evaluated:Patient Re-evaluated prior to induction Oxygen Delivery Method: Circle system utilized Preoxygenation: Pre-oxygenation with 100% oxygen Induction Type: IV induction Ventilation: Mask ventilation without difficulty LMA: LMA inserted LMA Size: 4.0 Number of attempts: 1 Airway Equipment and Method: Bite block Placement Confirmation: positive ETCO2 Tube secured with: Tape Dental Injury: Teeth and Oropharynx as per pre-operative assessment

## 2021-09-06 NOTE — Anesthesia Preprocedure Evaluation (Addendum)
Anesthesia Evaluation  Patient identified by MRN, date of birth, ID band Patient awake    Reviewed: Allergy & Precautions, NPO status , Patient's Chart, lab work & pertinent test results  Airway Mallampati: II  TM Distance: >3 FB Neck ROM: Full    Dental  (+) Teeth Intact, Dental Advisory Given   Pulmonary neg pulmonary ROS,    Pulmonary exam normal breath sounds clear to auscultation       Cardiovascular negative cardio ROS Normal cardiovascular exam Rhythm:Regular Rate:Normal     Neuro/Psych PSYCHIATRIC DISORDERS Anxiety Depression negative neurological ROS     GI/Hepatic negative GI ROS, Neg liver ROS,   Endo/Other  negative endocrine ROSObesity   Renal/GU negative Renal ROS   anterior prolapse, posterior prolapse, stress urinary incontinence    Musculoskeletal negative musculoskeletal ROS (+)   Abdominal   Peds  Hematology negative hematology ROS (+)   Anesthesia Other Findings Day of surgery medications reviewed with the patient.  Reproductive/Obstetrics                             Anesthesia Physical Anesthesia Plan  ASA: 2  Anesthesia Plan: General   Post-op Pain Management:    Induction: Intravenous  PONV Risk Score and Plan: 4 or greater and Scopolamine patch - Pre-op, Midazolam, Dexamethasone and Ondansetron  Airway Management Planned: LMA  Additional Equipment:   Intra-op Plan:   Post-operative Plan: Extubation in OR  Informed Consent: I have reviewed the patients History and Physical, chart, labs and discussed the procedure including the risks, benefits and alternatives for the proposed anesthesia with the patient or authorized representative who has indicated his/her understanding and acceptance.     Dental advisory given  Plan Discussed with: CRNA  Anesthesia Plan Comments:        Anesthesia Quick Evaluation

## 2021-09-06 NOTE — Discharge Instructions (Addendum)

## 2021-09-07 ENCOUNTER — Encounter: Payer: Self-pay | Admitting: Obstetrics and Gynecology

## 2021-09-07 ENCOUNTER — Telehealth: Payer: Self-pay | Admitting: Obstetrics and Gynecology

## 2021-09-07 ENCOUNTER — Encounter (HOSPITAL_BASED_OUTPATIENT_CLINIC_OR_DEPARTMENT_OTHER): Payer: Self-pay | Admitting: Obstetrics and Gynecology

## 2021-09-07 NOTE — Anesthesia Postprocedure Evaluation (Signed)
Anesthesia Post Note  Patient: Environmental health practitioner  Procedure(s) Performed: ANTERIOR AND POSTERIOR REPAIR WITH PERINEORRHAPHY (Vagina ) TRANSVAGINAL TAPE (TVT) PROCEDURE (Vagina ) CYSTOSCOPY (Bladder)     Patient location during evaluation: PACU Anesthesia Type: General Level of consciousness: awake and alert Pain management: pain level controlled Vital Signs Assessment: post-procedure vital signs reviewed and stable Respiratory status: spontaneous breathing, nonlabored ventilation, respiratory function stable and patient connected to nasal cannula oxygen Cardiovascular status: blood pressure returned to baseline and stable Postop Assessment: no apparent nausea or vomiting Anesthetic complications: no   No notable events documented.  Last Vitals:  Vitals:   09/06/21 1345 09/06/21 1400  BP: 114/65 120/82  Pulse: 71 66  Resp: (!) 26 14  Temp:    SpO2: 97% 97%    Last Pain:  Vitals:   09/06/21 1430  TempSrc:   PainSc: 0-No pain                 Santa Lighter

## 2021-09-07 NOTE — Telephone Encounter (Signed)
Erin Dennis underwent anterior and posterior repair with perineorrhaphy, midurethral sling and cystoscopy on 09/06/21.   She passed her voiding trial.  366ml was backfilled into the bladder Voided 244ml  PVR by bladder scan was 78ml.   She was discharged without a catheter. Please call her for a routine post op check. Thanks!  [name], MD

## 2021-09-08 ENCOUNTER — Encounter: Payer: Self-pay | Admitting: *Deleted

## 2021-09-10 NOTE — Telephone Encounter (Signed)
Pt was called back Tuesday and was given the recommendation from Dr. Wannetta Sender.

## 2021-09-14 NOTE — Telephone Encounter (Signed)
Post- Op Call  [employer] underwent anterior and posterior repair with perineorrhaphy, midurethral sling and cystoscopy on 09/06/2021 with Dr Wannetta Sender. The patient reports that her pain is controlled. She is taking no pain medication. Pt said she took 1 oxycodone 5mg  and 1 ibuprofen. She denies vaginal bleeding. She has had a bowel movement and is taking nothing for a bowel regimen. She was discharged without a catheter and will keep her post op appt  Elita Quick, [place]

## 2021-09-15 ENCOUNTER — Other Ambulatory Visit: Payer: Self-pay

## 2021-09-15 ENCOUNTER — Ambulatory Visit (INDEPENDENT_AMBULATORY_CARE_PROVIDER_SITE_OTHER): Payer: Medicaid Other | Admitting: Obstetrics and Gynecology

## 2021-09-15 ENCOUNTER — Encounter: Payer: Self-pay | Admitting: Obstetrics and Gynecology

## 2021-09-15 VITALS — BP 123/90 | HR 81

## 2021-09-15 DIAGNOSIS — Z9889 Other specified postprocedural states: Secondary | ICD-10-CM

## 2021-09-15 NOTE — Progress Notes (Signed)
[facility]  Date of Visit: 09/15/2021  History of Present Illness: Erin Dennis is a 41 y.o. female scheduled today for a post-operative visit.   Surgery: s/p Anterior and posterior repair with perineorrhaphy, midurethral sling, cystoscopy on 09/06/21  Feels a lot of swelling in the vaginal area- just like after a vaginal delivery. Has a lot of pressure and not sure it is normal. Still having urinary leakage, feels like she is emptying her bladder well. When she coughs, still feels the leakage.    Medications: She has a current medication list which includes the following prescription(s): escitalopram, isotretinoin, and ibuprofen.   Allergies: Patient is allergic to acetaminophen, 5-alpha reductase inhibitors, sulfamethoxazole-trimethoprim, and tape.   Physical Exam: BP 123/90    Pulse 81    LMP  (LMP Unknown) Comment: Tubes tied   Breastfeeding No    Pelvic Examination: Vagina: Incisions healing well. Sutures are present at incision line in the anterior and psoterior vagina and suburethra. No tenderness along the anterior or posterior vagina. No apical tenderness. No pelvic masses. No visible or palpable mesh.  POP-Q: deferred ---------------------------------------------------------  Assessment and Plan:  1. Post-operative state     - Overall healing well, no signs of infection - discussed what to expect for normal healing.   Follow up 5 weeks or sooner if needed  Erin Folds, MD

## 2021-09-16 ENCOUNTER — Ambulatory Visit: Payer: Medicaid Other | Admitting: Obstetrics and Gynecology

## 2021-09-20 ENCOUNTER — Telehealth: Payer: Medicaid Other

## 2021-09-20 ENCOUNTER — Telehealth: Payer: Medicaid Other | Admitting: Family

## 2021-09-20 DIAGNOSIS — J209 Acute bronchitis, unspecified: Secondary | ICD-10-CM

## 2021-09-20 MED ORDER — PREDNISONE 10 MG (21) PO TBPK: ORAL_TABLET | ORAL | 0 refills | Status: DC

## 2021-09-20 MED ORDER — BENZONATATE 100 MG PO CAPS
100.0000 mg | ORAL_CAPSULE | Freq: Three times a day (TID) | ORAL | 0 refills | Status: DC | PRN
Start: 2021-09-20 — End: 2022-01-05

## 2021-09-20 MED ORDER — PROMETHAZINE-DM 6.25-15 MG/5ML PO SYRP: 5.0000 mL | ORAL_SOLUTION | Freq: Three times a day (TID) | ORAL | 0 refills | Status: DC | PRN

## 2021-09-20 NOTE — Progress Notes (Signed)
Virtual Visit Consent   Erin Dennis, you are scheduled for a virtual visit with a [facility] provider today.     Just as with appointments in the office, your consent must be obtained to participate.  Your consent will be active for this visit and any virtual visit you may have with one of our providers in the next 365 days.     If you have a MyChart account, a copy of this consent can be sent to you electronically.  All virtual visits are billed to your insurance company just like a traditional visit in the office.    As this is a virtual visit, video technology does not allow for your provider to perform a traditional examination.  This may limit your provider's ability to fully assess your condition.  If your provider identifies any concerns that need to be evaluated in person or the need to arrange testing (such as labs, EKG, etc.), we will make arrangements to do so.     Although advances in technology are sophisticated, we cannot ensure that it will always work on either your end or our end.  If the connection with a video visit is poor, the visit may have to be switched to a telephone visit.  With either a video or telephone visit, we are not always able to ensure that we have a secure connection.     I need to obtain your verbal consent now.   Are you willing to proceed with your visit today?    Erin Dennis has provided verbal consent on 09/20/2021 for a virtual visit (video or telephone).   Evelina Dun, FNP   Date: 09/20/2021 6:32 PM   Virtual Visit via Video Note   I, Evelina Dun, connected with  Erin Dennis  ([id number], 1980-08-13) on 09/20/21 at  6:45 PM EST by a video-enabled telemedicine application and verified that I am speaking with the correct person using two identifiers.  Location: Patient: Virtual Visit Location Patient: Home Provider: Virtual Visit Location Provider: Home Office   I discussed the limitations of evaluation and management by telemedicine  and the availability of in person appointments. The patient expressed understanding and agreed to proceed.    History of Present Illness: Erin Dennis is a 41 y.o. who identifies as a female who was assigned female at birth, and is being seen today for coughing.  HPI: Cough This is a new problem. The current episode started 1 to 4 weeks ago. The problem has been gradually worsening. The problem occurs every few minutes. The cough is Non-productive. Associated symptoms include headaches, nasal congestion and postnasal drip. Pertinent negatives include no chills, ear congestion, ear pain, fever, myalgias, shortness of breath or wheezing. She has tried rest and OTC cough suppressant for the symptoms. The treatment provided moderate relief.   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   History of gestational diabetes 07/30/2020   Encounter for female sterilization procedure 06/03/2020   Depression 09/16/2008    Allergies:  Allergies  Allergen Reactions   Acetaminophen Anaphylaxis and Rash   5-Alpha Reductase Inhibitors    Sulfamethoxazole-Trimethoprim Other (See Comments)    Blisters   Tape Rash   Medications:  Current Outpatient Medications:    benzonatate (TESSALON PERLES) 100 MG capsule, Take 1 capsule (100 mg total) by mouth 3 (three) times daily as needed., Disp: 20 capsule, Rfl: 0   predniSONE (STERAPRED UNI-PAK 21 TAB) 10 MG (21) TBPK tablet, Use as directed, Disp: 21 tablet, Rfl: 0   promethazine-dextromethorphan (PROMETHAZINE-DM) 6.25-15 MG/5ML syrup, Take 5 mLs by mouth 3 (three) times daily as needed for cough., Disp: 118 mL, Rfl: 0   escitalopram (LEXAPRO) 10 MG  tablet, TAKE 1 TABLET BY MOUTH EVERY DAY, Disp: 90 tablet, Rfl: 2   ISOtretinoin (ACCUTANE) 40 MG capsule, Take 40 mg by mouth 2 (two) times daily., Disp: , Rfl:   Observations/Objective: Patient is well-developed, well-nourished in no acute distress.  Resting comfortably  at home.  Head is normocephalic, atraumatic.  No labored breathing.  Speech is clear and coherent with logical content.  Patient is alert and oriented at baseline.  Congestion and deep cough  Assessment and Plan: 1. Acute bronchitis, unspecified organism - predniSONE (STERAPRED UNI-PAK 21 TAB) 10 MG (21) TBPK tablet; Use as directed  Dispense: 21 tablet; Refill: 0 - benzonatate (TESSALON PERLES) 100 MG capsule; Take 1 capsule (100 mg total) by mouth 3 (three) times daily as needed.  Dispense: 20 capsule; Refill: 0 - promethazine-dextromethorphan (PROMETHAZINE-DM) 6.25-15 MG/5ML syrup; Take 5 mLs by mouth 3 (three) times daily as needed for cough.  Dispense: 118 mL; Refill: 0  - Take meds as prescribed - Use a cool mist humidifier  -Use saline nose sprays frequently -Force fluids -For any cough or congestion  Use plain Mucinex- regular strength or max strength is fine -For fever or aces or pains- take tylenol or ibuprofen. -Throat lozenges if help -Follow up if symptoms worsen or do not improve   Follow Up Instructions: I discussed the assessment and treatment plan with the patient. The patient was provided an opportunity to ask questions and all were answered. The patient agreed with the plan and demonstrated an understanding of the instructions.  A copy of instructions were sent to the patient via MyChart unless otherwise noted below.    The patient was advised to call back or seek an in-person evaluation if the symptoms worsen or if the condition fails to improve as anticipated.  Time:  I spent 6 minutes with the patient via telehealth technology discussing the above problems/concerns.    Evelina Dun,  FNP

## 2021-09-21 ENCOUNTER — Telehealth: Payer: Medicaid Other | Admitting: Physician Assistant

## 2021-09-21 DIAGNOSIS — J40 Bronchitis, not specified as acute or chronic: Secondary | ICD-10-CM

## 2021-09-21 MED ORDER — ALBUTEROL SULFATE HFA 108 (90 BASE) MCG/ACT IN AERS: 2.0000 | INHALATION_SPRAY | Freq: Four times a day (QID) | RESPIRATORY_TRACT | 0 refills | Status: DC | PRN

## 2021-09-21 MED ORDER — PSEUDOEPH-BROMPHEN-DM 30-2-10 MG/5ML PO SYRP: 5.0000 mL | ORAL_SOLUTION | Freq: Four times a day (QID) | ORAL | 0 refills | Status: DC | PRN

## 2021-09-21 NOTE — Patient Instructions (Addendum)
Erin Dennis, thank you for joining Mar Daring, PA-C for today's virtual visit.  While this provider is not your primary care provider (PCP), if your PCP is located in our provider database this encounter information will be shared with them immediately following your visit.  Consent: (Patient) Erin Dennis provided verbal consent for this virtual visit at the beginning of the encounter.  Current Medications:  Current Outpatient Medications:    albuterol (VENTOLIN HFA) 108 (90 Base) MCG/ACT inhaler, Inhale 2 puffs into the lungs every 6 (six) hours as needed for wheezing or shortness of breath., Disp: 8 g, Rfl: 0   brompheniramine-pseudoephedrine-DM 30-2-10 MG/5ML syrup, Take 5 mLs by mouth 4 (four) times daily as needed., Disp: 120 mL, Rfl: 0   benzonatate (TESSALON PERLES) 100 MG capsule, Take 1 capsule (100 mg total) by mouth 3 (three) times daily as needed., Disp: 20 capsule, Rfl: 0   escitalopram (LEXAPRO) 10 MG tablet, TAKE 1 TABLET BY MOUTH EVERY DAY, Disp: 90 tablet, Rfl: 2   ISOtretinoin (ACCUTANE) 40 MG capsule, Take 40 mg by mouth 2 (two) times daily., Disp: , Rfl:    predniSONE (STERAPRED UNI-PAK 21 TAB) 10 MG (21) TBPK tablet, Use as directed, Disp: 21 tablet, Rfl: 0   Medications ordered in this encounter:  Meds ordered this encounter  Medications   brompheniramine-pseudoephedrine-DM 30-2-10 MG/5ML syrup    Sig: Take 5 mLs by mouth 4 (four) times daily as needed.    Dispense:  120 mL    Refill:  0    Order Specific Question:   Supervising Provider    Answer:   MILLER, BRIAN [[id number]]   albuterol (VENTOLIN HFA) 108 (90 Base) MCG/ACT inhaler    Sig: Inhale 2 puffs into the lungs every 6 (six) hours as needed for wheezing or shortness of breath.    Dispense:  8 g    Refill:  0    Order Specific Question:   Supervising Provider    Answer:   Sabra Heck, [place]     *If you need refills on other medications prior to your next appointment, please contact your  pharmacy*  Follow-Up: Call back or seek an in-person evaluation if the symptoms worsen or if the condition fails to improve as anticipated.  Other Instructions Acute Bronchitis, Adult Acute bronchitis is sudden inflammation of the main airways (bronchi) that come off the windpipe (trachea) in the lungs. The swelling causes the airways to get smaller and make more mucus than normal. This can make it hard to breathe and can cause coughing or noisy breathing (wheezing). Acute bronchitis may last several weeks. The cough may last longer. Allergies, asthma, and exposure to smoke may make the condition worse. What are the causes? This condition can be caused by germs and by substances that irritate the lungs, including: Cold and flu viruses. The most common cause of this condition is the virus that causes the common cold. Bacteria. This is less common. Breathing in substances that irritate the lungs, including: Smoke from cigarettes and other forms of tobacco. Dust and pollen. Fumes from household cleaning products, gases, or burned fuel. Indoor or outdoor air pollution. What increases the risk? The following factors may make you more likely to develop this condition: A weak body's defense system, also called the immune system. A condition that affects your lungs and breathing, such as asthma. What are the signs or symptoms? Common symptoms of this condition include: Coughing. This may bring up clear, yellow, or green mucus from your  lungs (sputum). Wheezing. Runny or stuffy nose. Having too much mucus in your lungs (chest congestion). Shortness of breath. Aches and pains, including sore throat or chest. How is this diagnosed? This condition is usually diagnosed based on: Your symptoms and medical history. A physical exam. You may also have other tests, including tests to rule out other conditions, such as pneumonia. These tests include: A test of lung function. Test of a mucus sample  to look for the presence of bacteria. Tests to check the oxygen level in your blood. Blood tests. Chest X-ray. How is this treated? Most cases of acute bronchitis clear up over time without treatment. Your health care provider may recommend: Drinking more fluids to help thin your mucus so it is easier to cough up. Taking inhaled medicine (inhaler) to improve air flow in and out of your lungs. Using a vaporizer or a humidifier. These are machines that add water to the air to help you breathe better. Taking a medicine that thins mucus and clears congestion (expectorant). Taking a medicine that prevents or stops coughing (cough suppressant). It is notcommon to take an antibiotic medicine for this condition. Follow these instructions at home:  Take over-the-counter and prescription medicines only as told by your health care provider. Use an inhaler, vaporizer, or humidifier as told by your health care provider. Take two teaspoons (10 mL) of honey at bedtime to lessen coughing at night. Drink enough fluid to keep your urine pale yellow. Do not use any products that contain nicotine or tobacco. These products include cigarettes, chewing tobacco, and vaping devices, such as e-cigarettes. If you need help quitting, ask your health care provider. Get plenty of rest. Return to your normal activities as told by your health care provider. Ask your health care provider what activities are safe for you. Keep all follow-up visits. This is important. How is this prevented? To lower your risk of getting this condition again: Wash your hands often with soap and water for at least 20 seconds. If soap and water are not available, use hand sanitizer. Avoid contact with people who have cold symptoms. Try not to touch your mouth, nose, or eyes with your hands. Avoid breathing in smoke or chemical fumes. Breathing smoke or chemical fumes will make your condition worse. Get the flu shot every year. Contact a  health care provider if: Your symptoms do not improve after 2 weeks. You have trouble coughing up the mucus. Your cough keeps you awake at night. You have a fever. Get help right away if you: Cough up blood. Feel pain in your chest. Have severe shortness of breath. Faint or keep feeling like you are going to faint. Have a severe headache. Have a fever or chills that get worse. These symptoms may represent a serious problem that is an emergency. Do not wait to see if the symptoms will go away. Get medical help right away. Call your local emergency services (911 in the [place] Do not drive yourself to the hospital. Summary Acute bronchitis is inflammation of the main airways (bronchi) that come off the windpipe (trachea) in the lungs. The swelling causes the airways to get smaller and make more mucus than normal. Drinking more fluids can help thin your mucus so it is easier to cough up. Take over-the-counter and prescription medicines only as told by your health care provider. Do not use any products that contain nicotine or tobacco. These products include cigarettes, chewing tobacco, and vaping devices, such as e-cigarettes. If you need  help quitting, ask your health care provider. Contact a health care provider if your symptoms do not improve after 2 weeks. This information is not intended to replace advice given to you by your health care provider. Make sure you discuss any questions you have with your health care provider. Document Revised: 11/18/2020 Document Reviewed: 11/18/2020 Elsevier Patient Education  2022 [employer].   If you have been instructed to have an in-person evaluation today at a local Urgent Care facility, please use the link below. It will take you to a list of all of our available [facility], including address, phone number and hours of operation. Please do not delay care.  [facility]  If you or a family member do not have a primary care  provider, use the link below to schedule a visit and establish care. When you choose a [facility] primary care physician or advanced practice provider, you gain a long-term partner in health. Find a Primary Care Provider  Learn more about 's in-office and virtual care options: [facility]

## 2021-09-21 NOTE — Progress Notes (Signed)
Virtual Visit Consent   Erin Dennis, you are scheduled for a virtual visit with a [facility] provider today.     Just as with appointments in the office, your consent must be obtained to participate.  Your consent will be active for this visit and any virtual visit you may have with one of our providers in the next 365 days.     If you have a MyChart account, a copy of this consent can be sent to you electronically.  All virtual visits are billed to your insurance company just like a traditional visit in the office.    As this is a virtual visit, video technology does not allow for your provider to perform a traditional examination.  This may limit your provider's ability to fully assess your condition.  If your provider identifies any concerns that need to be evaluated in person or the need to arrange testing (such as labs, EKG, etc.), we will make arrangements to do so.     Although advances in technology are sophisticated, we cannot ensure that it will always work on either your end or our end.  If the connection with a video visit is poor, the visit may have to be switched to a telephone visit.  With either a video or telephone visit, we are not always able to ensure that we have a secure connection.     I need to obtain your verbal consent now.   Are you willing to proceed with your visit today?    Penda Raval has provided verbal consent on 09/21/2021 for a virtual visit (video or telephone).   Mar Daring, PA-C   Date: 09/21/2021 3:14 PM   Virtual Visit via Video Note   I, Mar Daring, connected with  Erin Dennis  ([id number], 1981-02-15) on 09/21/21 at  3:15 PM EST by a video-enabled telemedicine application and verified that I am speaking with the correct person using two identifiers.  Location: Patient: Virtual Visit Location Patient: Home Provider: Virtual Visit Location Provider: Home Office   I discussed the limitations of evaluation and management by  telemedicine and the availability of in person appointments. The patient expressed understanding and agreed to proceed.    History of Present Illness: Erin Dennis is a 41 y.o. who identifies as a female who was assigned female at birth, and is being seen today for continued cough. Was evaluated yesterday virtually and felt to have viral bronchitis. Was started on prednisone 6 day taper, promethazine DM and tessalon perles. She feels cough has worsened. Now unable to cough up anything like previously. Did have bladder sling procedure done on 09/06/21. Her 85 month old son is also sick with similar symptoms. He has been seen at ER and tested negative for flu, covid, RSV, and strep.    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   History of gestational diabetes 07/30/2020   Encounter for female sterilization procedure 06/03/2020   Depression 09/16/2008    Allergies:  Allergies  Allergen Reactions   Acetaminophen Anaphylaxis and Rash   5-Alpha Reductase Inhibitors    Sulfamethoxazole-Trimethoprim Other (See Comments)    Blisters   Tape Rash   Medications:  Current  Outpatient Medications:    albuterol (VENTOLIN HFA) 108 (90 Base) MCG/ACT inhaler, Inhale 2 puffs into the lungs every 6 (six) hours as needed for wheezing or shortness of breath., Disp: 8 g, Rfl: 0   brompheniramine-pseudoephedrine-DM 30-2-10 MG/5ML syrup, Take 5 mLs by mouth 4 (four) times daily as needed., Disp: 120 mL, Rfl: 0   benzonatate (TESSALON PERLES) 100 MG capsule, Take 1 capsule (100 mg total) by mouth 3 (three) times daily as needed., Disp: 20 capsule, Rfl: 0   escitalopram (LEXAPRO) 10 MG tablet, TAKE 1  TABLET BY MOUTH EVERY DAY, Disp: 90 tablet, Rfl: 2   ISOtretinoin (ACCUTANE) 40 MG capsule, Take 40 mg by mouth 2 (two) times daily., Disp: , Rfl:    predniSONE (STERAPRED UNI-PAK 21 TAB) 10 MG (21) TBPK tablet, Use as directed, Disp: 21 tablet, Rfl: 0  Observations/Objective: Patient is well-developed, well-nourished in no acute distress.  Resting comfortably at home.  Head is normocephalic, atraumatic.  No labored breathing.  Speech is clear and coherent with logical content.  Patient is alert and oriented at baseline.    Assessment and Plan: 1. Bronchitis - brompheniramine-pseudoephedrine-DM 30-2-10 MG/5ML syrup; Take 5 mLs by mouth 4 (four) times daily as needed.  Dispense: 120 mL; Refill: 0 - albuterol (VENTOLIN HFA) 108 (90 Base) MCG/ACT inhaler; Inhale 2 puffs into the lungs every 6 (six) hours as needed for wheezing or shortness of breath.  Dispense: 8 g; Refill: 0  - Stop Promethazine DM - Change to Bromfed DM - Add Albuterol - Continue tessalon perles and prednisone - Seek in person evaluation if cough continues to worsen to be evaluated and r/o pneumonia  Follow Up Instructions: I discussed the assessment and treatment plan with the patient. The patient was provided an opportunity to ask questions and all were answered. The patient agreed with the plan and demonstrated an understanding of the instructions.  A copy of instructions were sent to the patient via MyChart unless otherwise noted below.   The patient was advised to call back or seek an in-person evaluation if the symptoms worsen or if the condition fails to improve as anticipated.  Time:  I spent 10 minutes with the patient via telehealth technology discussing the above problems/concerns.    Mar Daring, PA-C

## 2021-10-18 NOTE — Progress Notes (Signed)
[facility] ? ?Date of Visit: 10/19/2021 ? ?History of Present Illness: Erin Dennis is a 41 y.o. female scheduled today for a post-operative visit.  ?? Surgery: s/p anterior and posterior repair with perineorrhaphy, midurethral sling and cystoscopy on 09/06/2021  ?? She passed her postoperative void trial.  ?? Postoperative course has been uncomplicated.  ? ?Today she reports she noticed a discharge.  ? ?UTI in the last 6 weeks? No  ?Pain? No  ?She has returned to her normal activity (except for postop restrictions) ?Vaginal bulge?  Maybe a small amount but not as much as it was before ?Stress incontinence: No  ?Urgency/frequency:  has a small amount of urgency ?Urge incontinence:  sometimes has a small amount  ?Voiding dysfunction: No  ?Bowel issues: No  ? ?Subjective Success: Do you usually have a bulge or something falling out that you can see or feel in the vaginal area? Yes, sometimes ?Retreatment Success: Any retreatment with surgery or pessary for any compartment? No  ? ? ? ?Medications: She has a current medication list which includes the following prescription(s): albuterol, benzonatate, brompheniramine-pseudoephedrine-dm, escitalopram, isotretinoin, and prednisone.  ? ?Allergies: Patient is allergic to acetaminophen, 5-alpha reductase inhibitors, sulfamethoxazole-trimethoprim, and tape.  ? ?Physical Exam: ?BP (!) 144/84   Pulse 76   Breastfeeding No   ? ?Pelvic Examination: Vagina: Incisions healing well. Sutures are not present at incision line and there is not granulation tissue. No tenderness along the anterior or posterior vagina. No apical tenderness. No pelvic masses. No visible or palpable mesh. ? ?POP-Q: ?POP-Q ? ?-1.5  ?                                          Aa   ?-1.5 ?                                          Ba  ?-6  ?                                            C  ? ?3.5  ?                                          Gh  ?3  ?                                          Pb  ?11  ?                                           tvl  ? ?-3  ?                                          Ap  ?-3  ?  Bp  ?-10  ?                                            D  ? ? ?--------------------------------------------------------- ? ?Assessment and Plan:  ?1. Post-operative state   ?2. Prolapse of anterior vaginal wall   ?3. Uterovaginal prolapse, incomplete   ? ? ?- Has some antero-apical recurrence of prolapse but overall improved  ?- Can resume regular activity including exercise and intercourse,  if desired.  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  ? ?Return 3 months for repeat exam ? ? ?[name], MD ? ? ?

## 2021-10-19 ENCOUNTER — Ambulatory Visit (INDEPENDENT_AMBULATORY_CARE_PROVIDER_SITE_OTHER): Payer: Medicaid Other | Admitting: Obstetrics and Gynecology

## 2021-10-19 ENCOUNTER — Encounter: Payer: Self-pay | Admitting: Obstetrics and Gynecology

## 2021-10-19 ENCOUNTER — Other Ambulatory Visit: Payer: Self-pay

## 2021-10-19 VITALS — BP 144/84 | HR 76

## 2021-10-19 DIAGNOSIS — Z9889 Other specified postprocedural states: Secondary | ICD-10-CM

## 2021-10-19 DIAGNOSIS — N8110 Cystocele, unspecified: Secondary | ICD-10-CM

## 2021-10-19 DIAGNOSIS — N812 Incomplete uterovaginal prolapse: Secondary | ICD-10-CM

## 2021-12-06 ENCOUNTER — Ambulatory Visit
Admission: EM | Admit: 2021-12-06 | Discharge: 2021-12-06 | Disposition: A | Discharge status: Home / Self Care | Payer: 59 | Attending: Nurse Practitioner | Admitting: Nurse Practitioner

## 2021-12-06 ENCOUNTER — Ambulatory Visit: Payer: 59

## 2021-12-06 DIAGNOSIS — S82892A Other fracture of left lower leg, initial encounter for closed fracture: Secondary | ICD-10-CM

## 2021-12-06 DIAGNOSIS — M25572 Pain in left ankle and joints of left foot: Secondary | ICD-10-CM | POA: Diagnosis not present

## 2021-12-06 DIAGNOSIS — Y9301 Activity, walking, marching and hiking: Secondary | ICD-10-CM | POA: Diagnosis not present

## 2021-12-06 MED ORDER — TRAMADOL HCL 50 MG PO TABS: 50.0000 mg | ORAL_TABLET | Freq: Two times a day (BID) | ORAL | 0 refills | Status: DC | PRN

## 2021-12-06 NOTE — ED Triage Notes (Signed)
Pt presents with left ankle pain from fall on saturday ?

## 2021-12-06 NOTE — Discharge Instructions (Addendum)
According to your x-rays, there is a fracture to the left ankle. ?Use the cam boot that you currently have at home. ?RICE therapy, rest, ice, compression elevation. ?Take medication as prescribed.  You may take ibuprofen during the day. ?Weightbearing as tolerated. ?I would like for you to follow-up with [facility].  Call them tomorrow to schedule an appointment.  You need to be seen within the next 24 to 48 hours. ?Follow-up as needed. ?

## 2021-12-06 NOTE — ED Provider Notes (Signed)
?[facility] ? ? ? ?CSN: [id number] ?Arrival date & time: 12/06/21  1847 ? ? ?  ? ?History   ?Chief Complaint ?Chief Complaint  ?Patient presents with  ? Ankle Pain  ? ? ?HPI ?Erin Dennis is a 41 y.o. female.  ? ?The patient is a 41 year old female who presents with left ankle pain.  Patient states that she was out of [place], and as she was going down she rolled the left ankle.  Patient states that she felt a crack in the ankle when it occurred.  Since that time she has had swelling, pain with ambulation, and decreased range of motion in the left ankle.  She states that she has been taking ibuprofen and wrapping the ankle with an Ace wrap for her symptoms. ? ?The history is provided by the patient.  ?Ankle Pain ?Location:  Ankle ?Ankle location:  L ankle ? ?Past Medical History:  ?Diagnosis Date  ? Anxiety   ? Darier disease   ? Depression   ? following miscarriage  ? Febrile seizure ([place])   ? Infection   ? UTI  ? Ovarian cyst   ? Personal history of gestational diabetes 2022  ? ? ?Patient Active Problem List  ? Diagnosis Date Noted  ? Menorrhagia with regular cycle   ? Dysmenorrhea   ? Postpartum hypertension 11/26/2020  ? IUD (intrauterine device) in place 10/15/2020  ? Forceps delivery with baby delivered 10/15/2020  ? Hematoma 10/15/2020  ? History of gestational hypertension 10/14/2020  ? 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 History of gestational diabetes 07/30/2020  ? Encounter for female sterilization procedure 06/03/2020  ? Depression 09/16/2008  ? ? ?Past Surgical History:  ?Procedure Laterality Date  ? ANTERIOR AND POSTERIOR REPAIR WITH SACROSPINOUS FIXATION N/A 09/06/2021  ? Procedure: ANTERIOR AND POSTERIOR REPAIR WITH PERINEORRHAPHY;  Surgeon: [name], MD;  Location: [facility];  Service: Gynecology;  Laterality: N/A;  ? APPENDECTOMY    ? BLADDER SUSPENSION N/A 09/06/2021  ? Procedure: TRANSVAGINAL TAPE (TVT) PROCEDURE;  Surgeon: [name], MD;  Location: [facility];  Service: Gynecology;  Laterality: N/A;  ? CHOLECYSTECTOMY    ? CHOLECYSTECTOMY    ? CYSTOSCOPY N/A 09/06/2021  ? Procedure: CYSTOSCOPY;  Surgeon: [name], MD;  Location: [facility];  Service: Gynecology;  Laterality: N/A;  ? DILATATION AND CURETTAGE/HYSTEROSCOPY WITH MINERVA N/A 12/09/2020  ? Procedure: DILATATION AND CURETTAGE/HYSTEROSCOPY WITH ATTEMPTED  MINERVA  ENDOMETRIAL ABLATION;  Surgeon: [name], MD;  Location: [facility];  Service: Gynecology;  Laterality: N/A;  ? DILATION AND CURETTAGE OF UTERUS    ? for menorrhagia  ? DILATION AND CURETTAGE OF UTERUS N/A 10/07/2019  ? Procedure: SUCTION DILATATION AND CURETTAGE;  Surgeon: [name], MD;  Location: [facility];  Service: Gynecology;  Laterality: N/A;  Dr. Request Time 9:30am  ? LAPAROSCOPIC BILATERAL SALPINGECTOMY Bilateral 12/09/2020  ? Procedure: LAPAROSCOPIC BILATERAL SALPINGECTOMY;  Surgeon: [name], MD;  Location: [facility];  Service: Gynecology;  Laterality: Bilateral;  ? OVARIAN CYST SURGERY    ? x3  ? TONSILLECTOMY    ? TONSILLECTOMY  2007  ? WISDOM TOOTH EXTRACTION    ? ? ?OB History   ? ? Gravida  ?3  ? Para  ?1  ? Term  ?1  ? Preterm  ?   ? AB  ?2  ? Living  ?1  ?  ? ? SAB  ?1  ?  IAB  ?   ? Ectopic  ?1  ? Multiple  ?   ? Live Births  ?1  ?   ?  ?  ? ? ? ?Home Medications   ? ?Prior to Admission medications   ?Medication Sig Start Date End Date Taking? Authorizing Provider  ?traMADol (ULTRAM) 50 MG tablet Take 1 tablet (50 mg total) by mouth every 12 (twelve) hours as needed. 12/06/21  Yes Tirsa Gail-Warren, Alda Lea, NP  ?albuterol (VENTOLIN HFA) 108 (90 Base) MCG/ACT inhaler Inhale 2 puffs into the lungs every 6 (six) hours as needed for wheezing or shortness of breath. 09/21/21   Mar Daring, PA-C  ?benzonatate (TESSALON PERLES) 100 MG capsule Take 1 capsule (100 mg total) by mouth 3 (three) times daily as needed. 09/20/21   Sharion Balloon, FNP   ?brompheniramine-pseudoephedrine-DM 30-2-10 MG/5ML syrup Take 5 mLs by mouth 4 (four) times daily as needed. 09/21/21   Mar Daring, PA-C  ?escitalopram (LEXAPRO) 10 MG tablet TAKE 1 TABLET BY MOUTH EVERY DAY 05/24/21   [name], MD  ?ISOtretinoin (ACCUTANE) 40 MG capsule Take 40 mg by mouth 2 (two) times daily.    [provider]  ?predniSONE (STERAPRED UNI-PAK 21 TAB) 10 MG (21) TBPK tablet Use as directed 09/20/21   Sharion Balloon, FNP  ? ? ?Family History ?Family History  ?Problem Relation Age of Onset  ? Diabetes Mother   ? Hypertension Mother   ? Diabetes Father   ? Stroke Father   ? Cancer Maternal Grandmother   ?     ovarian  ? Jaundice Son   ? ? ?Social History ?Social History  ? ?Tobacco Use  ? Smoking status: Never  ? Smokeless tobacco: Never  ?Vaping Use  ? Vaping Use: Never used  ?Substance Use Topics  ? Alcohol use: Not Currently  ? Drug use: Not Currently  ? ? ? ?Allergies   ?Acetaminophen, 5-alpha reductase inhibitors, Sulfamethoxazole-trimethoprim, and Tape ? ? ?Review of Systems ?Review of Systems  ?Constitutional: Negative.   ?Musculoskeletal:  Positive for gait problem and joint swelling (left ankle).  ?Skin: Negative.   ?Psychiatric/Behavioral: Negative.    ? ? ?Physical Exam ?Triage Vital Signs ?ED Triage Vitals  ?Enc Vitals Group  ?   BP --   ?   Pulse Rate 12/06/21 1920 72  ?   Resp 12/06/21 1920 20  ?   Temp 12/06/21 1920 98.9 ?F (37.2 ?C)  ?   Temp src --   ?   SpO2 12/06/21 1920 96 %  ?   Weight --   ?   Height --   ?   Head Circumference --   ?   Peak Flow --   ?   Pain Score 12/06/21 1921 8  ?   Pain Loc --   ?   Pain Edu? --   ?   Excl. in [place]? --   ? ?No data found. ? ?Updated Vital Signs ?Pulse 72   Temp 98.9 ?F (37.2 ?C)   Resp 20   SpO2 96%  ? ?Visual Acuity ?Right Eye Distance:   ?Left Eye Distance:   ?Bilateral Distance:   ? ?Right Eye Near:   ?Left Eye Near:    ?Bilateral Near:    ? ?Physical Exam ?Vitals and nursing note reviewed.  ?Constitutional:   ?    Appearance: Normal appearance.  ?HENT:  ?   Head: Normocephalic and atraumatic.  ?Cardiovascular:  ?   Rate and  Rhythm: Normal rate and regular rhythm.  ?Pulmonary:  ?   Effort: Pulmonary effort is normal.  ?   Breath sounds: Normal breath sounds.  ?Abdominal:  ?   General: Bowel sounds are normal.  ?   Palpations: Abdomen is soft.  ?Musculoskeletal:  ?   Left ankle: Swelling, deformity and ecchymosis present. Tenderness present over the lateral malleolus. Decreased range of motion.  ?Skin: ?   Capillary Refill: Capillary refill takes less than 2 seconds.  ?Neurological:  ?   General: No focal deficit present.  ?   Mental Status: She is alert and oriented to person, place, and time.  ?Psychiatric:     ?   Mood and Affect: Mood normal.     ?   Behavior: Behavior normal.  ? ? ? ?UC Treatments / Results  ?Labs ?(all labs ordered are listed, but only abnormal results are displayed) ?Labs Reviewed - No data to display ? ?EKG ? ? ?Radiology ?DG Ankle Complete Left ? ?Result Date: 12/06/2021 ?CLINICAL DATA:  Injury. Lateral ankle pain after rolling ankle while hiking. EXAM: LEFT ANKLE COMPLETE - 3+ VIEW COMPARISON:  None Available. FINDINGS: There is a questionable fracture of the posterior tibial tubercle, seen only on the lateral view. No other fracture of the ankle, particularly no fracture of the lateral malleolus. There is a small well corticated density distal to the fibular tip that appears chronic and may represent sequela of remote injury or an accessory ossicle. Soft tissue edema is more prominent laterally. The talar dome is intact. No mortise widening. There is a plantar calcaneal spur and Achilles tendon enthesophyte. No convincing ankle joint effusion. IMPRESSION: 1. Questionable fracture of the posterior tibial tubercle, seen only on the lateral view. 2. Lateral soft tissue edema without distal fibular fracture. 3. Plantar calcaneal spur and Achilles tendon enthesophyte. Electronically Signed   By: Keith Rake M.D.   On: 12/06/2021 19:34   ? ?Procedures ?Procedures (including critical care time) ? ?Medications Ordered in UC ?Medications - No data to display ? ?Initial Impression / Assessment and Plan / UC Cou

## 2021-12-07 ENCOUNTER — Ambulatory Visit (INDEPENDENT_AMBULATORY_CARE_PROVIDER_SITE_OTHER): Payer: Self-pay

## 2021-12-07 ENCOUNTER — Ambulatory Visit (INDEPENDENT_AMBULATORY_CARE_PROVIDER_SITE_OTHER): Payer: Self-pay | Admitting: Podiatry

## 2021-12-07 DIAGNOSIS — S82892A Other fracture of left lower leg, initial encounter for closed fracture: Secondary | ICD-10-CM

## 2021-12-07 DIAGNOSIS — S82392A Other fracture of lower end of left tibia, initial encounter for closed fracture: Secondary | ICD-10-CM

## 2021-12-07 DIAGNOSIS — S93422A Sprain of deltoid ligament of left ankle, initial encounter: Secondary | ICD-10-CM

## 2021-12-07 DIAGNOSIS — S93432A Sprain of tibiofibular ligament of left ankle, initial encounter: Secondary | ICD-10-CM

## 2021-12-07 MED ORDER — IBUPROFEN 800 MG PO TABS: 800.0000 mg | ORAL_TABLET | Freq: Three times a day (TID) | ORAL | 0 refills | Status: DC | PRN

## 2021-12-07 MED ORDER — TRAMADOL HCL 50 MG PO TABS: 50.0000 mg | ORAL_TABLET | Freq: Four times a day (QID) | ORAL | 0 refills | Status: AC | PRN

## 2021-12-07 NOTE — Progress Notes (Signed)
?  Subjective:  ?Patient ID: Erin Dennis, female    DOB: 1980-12-29,  MRN: [id number] ? ?Chief Complaint  ?Patient presents with  ? Fracture  ?  Left ankle fracture - follow up ED  ? ? ?41 y.o. female presents with the above complaint. History confirmed with patient.  She returns today for follow-up she still like to have her bunions corrected at some point but she has a new left ankle injury.  On Saturday she was in [place] with her husband she walked down a rocky embankment and slipped and rolled her ankle.  She had immediate pain and began to swell and had quite a bit of bruising.  She went to the ER yesterday and they were concerned that she have some fracture.   ? ?Objective:  ?Physical Exam: ?warm, good capillary refill, no trophic changes or ulcerative lesions, normal DP and PT pulses, and normal sensory exam. ?Left ankle: Significant pain swelling and edema she has pain on palpation to the deltoid ligament and the posterior ankle joint and the lateral ankle ligaments as well as the syndesmosis.  Unable to bear weight without assistance.  No pain on navicular or fifth metatarsal base ? ? ?Radiographs: ?Multiple views x-ray of left ankle and leg taken today there is a questionable fracture of the posterior malleolus and, this is consistent with yesterday's nonweightbearing views, I do see some widening of the tibiofibular overlap, there is no evidence of Maisonneuve fracture, prior avulsion injury and well-corticated chronic appearing ossicle in the distal fibula:  ?Assessment:  ? ?1. Closed fracture of posterior malleolus of left tibia, initial encounter   ?2. Syndesmotic disruption of left ankle, initial encounter   ?3. Tear of deltoid ligament of left ankle, initial encounter   ?4. Ankle fracture, left, closed, initial encounter   ? ? ? ?Plan:  ?Patient was evaluated and treated and all questions answered. ? ?I reviewed today's radiographs with her my chief concern would be deltoid injury as well as  syndesmosis disruption and instability.  I recommended we place her into a cam boot and she may be WBAT in this for now.  Fortunately she does not have any evidence of clear Maisonneuve fracture.  We discussed that hopefully this is a severe sprain that will resolve with nonoperative treatment however if there is syndesmotic disruption posterior malleolar fracture and deltoid ligament tearing then she may require operative intervention which we discussed.  I have ordered a stat MRI to evaluate the soft tissue surrounding the left ankle.  I will see her back after the MRI for surgical planning if the MRI is negative for fracture or severe ligamentous tearing then we will treat nonoperatively and plan for 4 to 6 weeks in the walking boot and then physical therapy.  I did send her a prescription for tramadol and ibuprofen for pain control.  CAM boot was dispensed. ? ?Return for after MRI to review.  ? ?

## 2021-12-07 NOTE — Patient Instructions (Signed)
Call [facility] at [phone] to schedule your MRI ? ?

## 2021-12-08 ENCOUNTER — Telehealth: Payer: Self-pay | Admitting: Sports Medicine

## 2021-12-08 ENCOUNTER — Encounter: Payer: Self-pay | Admitting: Podiatry

## 2021-12-08 DIAGNOSIS — M25572 Pain in left ankle and joints of left foot: Secondary | ICD-10-CM

## 2021-12-08 DIAGNOSIS — S82392A Other fracture of lower end of left tibia, initial encounter for closed fracture: Secondary | ICD-10-CM

## 2021-12-08 DIAGNOSIS — S93432A Sprain of tibiofibular ligament of left ankle, initial encounter: Secondary | ICD-10-CM

## 2021-12-08 DIAGNOSIS — M7989 Other specified soft tissue disorders: Secondary | ICD-10-CM

## 2021-12-08 NOTE — Telephone Encounter (Signed)
Patient called answering service stated that she called the office earlier with concern of increasing pain going up the left lower extremity and increasing swelling at the front of the leg side and back.  Patient reports that she has been elevating icing and the pain is worse with the boot she cannot tolerate it.  Patient reports that she has also been using an Ace wrap but had to take it off because of the increased pain and swelling even above the wrap states that even with removing the wrap she is still having a lot pain and is concerned.  Patient also reports that she is concerned because she has not been called about her MRI. ? ?I advised patient since she has ibuprofen and tramadol to try to take this more consistently as I have directed.  Advised patient to continue with rest icing 20 minutes out of each hour and elevation at least to the level of the chest.  Advised patient to be nonweightbearing on the lower extremity due to increased pain until she can get her MRI.  We will have the office to follow-up on MRI and I have ordered a knee scooter to help her with being nonweightbearing on the left lower extremity.  I also advised patient if pain and swelling worsens after she has tried to take her pain medicine in the next 3 to 4 hours to call answering service back or to go to local ER or urgent care for further evaluation especially with decreased swelling and pain to be checked with ultrasound to exclude DVT however I think this is less likely. ? ?Dr. Cannon Kettle ?

## 2021-12-09 ENCOUNTER — Telehealth: Payer: Self-pay | Admitting: *Deleted

## 2021-12-09 NOTE — Telephone Encounter (Signed)
Called patient and gave her the information from [facility] stating that her approval for MRI is pending. ? She is in [place] at the moment but having unbearable pain even with boot on, her foot is swelling more and going up into her leg, going on since Wednesday. Explained that she should go to Urgent Care or ER as soon as possible, verbalized understanding and said ok.

## 2021-12-09 NOTE — Telephone Encounter (Signed)
-----   Message from Landis Martins, [place] sent at 12/08/2021  6:05 PM EDT ----- ?Regarding: Follow-up on MRI order ?Patient states that she has not heard anything about her MRI that Dr. Sherryle Lis ordered stat.  Please follow-up on this MRI order and update patient  ?Thanks. ? ?

## 2021-12-09 NOTE — Telephone Encounter (Signed)
Reached back out to [facility] for status of MRI approval, stated that they have run her insurance a few times and now it is showing that there is no coverage. ?

## 2021-12-09 NOTE — Telephone Encounter (Signed)
I spoke with Erin Dennis and verified above info, agree w/ recommendation to go to ER for [place], insurance issue will be checked and verified ?

## 2021-12-09 NOTE — Telephone Encounter (Signed)
-----   Message from Erin Dennis, [place] sent at 12/08/2021  6:05 PM EDT ----- ?Regarding: Follow-up on MRI order ?Patient states that she has not heard anything about her MRI that Dr. Sherryle Lis ordered stat.  Please follow-up on this MRI order and update patient  ?Thanks. ? ?

## 2021-12-16 ENCOUNTER — Ambulatory Visit
Admission: RE | Admit: 2021-12-16 | Discharge: 2021-12-16 | Disposition: A | Discharge status: Home / Self Care | Payer: 59 | Source: Ambulatory Visit | Attending: Podiatry | Admitting: Podiatry

## 2021-12-16 DIAGNOSIS — S82392A Other fracture of lower end of left tibia, initial encounter for closed fracture: Secondary | ICD-10-CM

## 2021-12-16 DIAGNOSIS — S93422A Sprain of deltoid ligament of left ankle, initial encounter: Secondary | ICD-10-CM

## 2021-12-16 DIAGNOSIS — S93432A Sprain of tibiofibular ligament of left ankle, initial encounter: Secondary | ICD-10-CM

## 2022-01-05 ENCOUNTER — Telehealth: Payer: 59 | Admitting: Physician Assistant

## 2022-01-05 NOTE — Patient Instructions (Signed)
  Erin Dennis, thank you for joining [name], PA-C for today's virtual visit.  While this provider is not your primary care provider (PCP), if your PCP is located in our provider database this encounter information will be shared with them immediately following your visit.  Consent: (Patient) Erin Dennis provided verbal consent for this virtual visit at the beginning of the encounter.  Current Medications:  Current Outpatient Medications:    albuterol (VENTOLIN HFA) 108 (90 Base) MCG/ACT inhaler, Inhale 2 puffs into the lungs every 6 (six) hours as needed for wheezing or shortness of breath., Disp: 8 g, Rfl: 0   benzonatate (TESSALON PERLES) 100 MG capsule, Take 1 capsule (100 mg total) by mouth 3 (three) times daily as needed., Disp: 20 capsule, Rfl: 0   brompheniramine-pseudoephedrine-DM 30-2-10 MG/5ML syrup, Take 5 mLs by mouth 4 (four) times daily as needed., Disp: 120 mL, Rfl: 0   escitalopram (LEXAPRO) 10 MG tablet, TAKE 1 TABLET BY MOUTH EVERY DAY, Disp: 90 tablet, Rfl: 2   ibuprofen (ADVIL) 800 MG tablet, Take 1 tablet (800 mg total) by mouth every 8 (eight) hours as needed., Disp: 30 tablet, Rfl: 0   ISOtretinoin (ACCUTANE) 40 MG capsule, Take 40 mg by mouth 2 (two) times daily., Disp: , Rfl:    predniSONE (STERAPRED UNI-PAK 21 TAB) 10 MG (21) TBPK tablet, Use as directed, Disp: 21 tablet, Rfl: 0   Medications ordered in this encounter:  No orders of the defined types were placed in this encounter.    *If you need refills on other medications prior to your next appointment, please contact your pharmacy*  Follow-Up: Call back or seek an in-person evaluation if the symptoms worsen or if the condition fails to improve as anticipated.  Other Instructions [email or web address] -- select dermatology provider.    If you have been instructed to have an in-person evaluation today at a local Urgent Care facility, please use the link below. It will take you to a list  of all of our available [facility], including address, phone number and hours of operation. Please do not delay care.  [facility]  If you or a family member do not have a primary care provider, use the link below to schedule a visit and establish care. When you choose a [facility] primary care physician or advanced practice provider, you gain a long-term partner in health. Find a Primary Care Provider  Learn more about [facility]'s in-office and virtual care options: [facility]

## 2022-01-05 NOTE — Progress Notes (Signed)
Patient needing [facility] provider to prescribe her Soriatane that [place] Dermatologist ([place] sent in but cannot be filled as she has  Medicaid. Does not have PCP. Discussed we cannot provide that through our department. Sent her to [email or web address] so she can select [facility] provider.

## 2022-01-06 ENCOUNTER — Telehealth: Payer: Self-pay | Admitting: *Deleted

## 2022-01-06 ENCOUNTER — Encounter: Payer: Self-pay | Admitting: Obstetrics & Gynecology

## 2022-01-06 ENCOUNTER — Encounter: Payer: Self-pay | Admitting: Obstetrics and Gynecology

## 2022-01-06 NOTE — Telephone Encounter (Signed)
Went to [facility] dermatologist and was prescribed med for her skin. Her insurance will not pay for it because her insurance is [place] Medicaid and the provider is licensed in [place]. Without rx from doctor with [place] license the med is $1121 monthly. Patient has tried to find a PCP or derm in the area but no one can see her until September. Patient wanted to see if Dr. Elonda Husky would send in the medicine to [facility] for her. 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 The medication is : - acitretin (SORIATANE) 10 MG capsule; Take 1 capsule by mouth every morning (before breakfast). Dispense: 90 capsule; Refill: 3  Advised patient Dr. Elonda Husky is not in the office today but I would send message to him and we could let her know something tomorrow.

## 2022-01-07 ENCOUNTER — Telehealth: Payer: 59 | Admitting: Family Medicine

## 2022-01-07 DIAGNOSIS — L989 Disorder of the skin and subcutaneous tissue, unspecified: Secondary | ICD-10-CM

## 2022-01-07 MED ORDER — ACITRETIN 10 MG PO CAPS: 10.0000 mg | ORAL_CAPSULE | Freq: Every day | ORAL | 3 refills | Status: AC

## 2022-01-07 NOTE — Progress Notes (Signed)
Pt is advised to go to urgent care to see if they will do the referral. She is needing a referral to derm to get her accutane refilled since childbirth and doesn't have a pcp. I have explained we dont provide this service nor can we refill that medication. [facility]

## 2022-01-10 NOTE — Telephone Encounter (Signed)
I would like to have ongoing communication from her dermatologist directly to me todocument ongoing need for this medication.  So the answer is yes as long as I get communication from the derm doc.

## 2022-01-17 NOTE — Progress Notes (Unsigned)
[facility] Return Visit  SUBJECTIVE  History of Present Illness: Erin Dennis is a 41 y.o. female seen in follow-up for prolapse and incontinence.  s/p anterior and posterior repair with perineorrhaphy, midurethral sling and cystoscopy on 09/06/2021   Still has a lot of leakage with cough/ sneeze. Does not feel the sling helped much for her leakage. She is not having as much leakage with squatting as she had. Also having more urgency and can't make it to the bathroom- she feels this aspect has been getting worse. Drinking water or gatorade, may have occasional soda.   Still feels the prolapse and it is worse than before. It is very bothersome   Past Medical History: Patient  has a past medical history of Anxiety, Darier disease, Depression, Febrile seizure ([place]), Infection, Ovarian cyst, and Personal history of gestational diabetes (2022).   Past Surgical History: She  has a past surgical history that includes Appendectomy; Cholecystectomy; Tonsillectomy; Wisdom tooth extraction; Ovarian cyst surgery; Dilation and curettage of uterus; Dilation and curettage of uterus (N/A, 10/07/2019); Tonsillectomy (2007); Laparoscopic bilateral salpingectomy (Bilateral, 12/09/2020); Dilatation and curettage/hysteroscopy with minerva (N/A, 12/09/2020); Cholecystectomy; Anterior and posterior repair with sacrospinous fixation (N/A, 09/06/2021); Bladder suspension (N/A, 09/06/2021); and Cystoscopy (N/A, 09/06/2021).   Medications: She has a current medication list which includes the following prescription(s): acitretin and solifenacin.   Allergies: Patient is allergic to acetaminophen, 5-alpha reductase inhibitors, sulfamethoxazole-trimethoprim, and tape.   Social History: Patient  reports that she has never smoked. She has never used smokeless tobacco. She reports that she does not currently use alcohol. She reports that she does not currently use drugs.      OBJECTIVE     Physical  Exam: Vitals:   01/18/22 0910  BP: 125/86  Pulse: 66   Gen: No apparent distress, A&O x 3.  Detailed Urogynecologic Evaluation:  Normal external genitalia. Speculum exam reveals normal vaginal mucosa. Normal cervix with IUD strings, scant blood in the vaginal vault.   POP-Q  -1                                            Aa   -1                                           Ba  -6                                              C   4                                            Gh  4                                            Pb  11  tvl   -2                                            Ap  -2                                            Bp  -10                                              D      ASSESSMENT AND PLAN    Ms. Slagel is a 41 y.o. with:  1. Overactive bladder   2. Prolapse of anterior vaginal wall   3. Uterovaginal prolapse, incomplete   4. SUI (stress urinary incontinence, female)    OAB - has worsened over time - We discussed the symptoms of overactive bladder (OAB), which include urinary urgency, urinary frequency, nocturia, with or without urge incontinence.  While we do not know the exact etiology of OAB, several treatment options exist. We discussed management including behavioral therapy (decreasing bladder irritants, urge suppression strategies, timed voids, bladder retraining), physical therapy, medication.  - Prescribed vesicare 5mg  daily  2. Stage II anterior, Stage I posterior, Stage I apical prolapse - recurrence of anterior/apical prolapse and is bothersome.  - Discussed options for surgery and she would like to proceed with RA- TLH and sacrocolpopexy with mesh. 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 SUI - will have her undergo urodynamic testing to further assess need for incontinence procedure and bladder emptying due to previous sling.  - Discussed potential option  for urethral bulking, can also consider [facility].   Return for urodynamics  [name], MD

## 2022-01-18 ENCOUNTER — Ambulatory Visit (INDEPENDENT_AMBULATORY_CARE_PROVIDER_SITE_OTHER): Payer: 59 | Admitting: Obstetrics and Gynecology

## 2022-01-18 ENCOUNTER — Encounter: Payer: Self-pay | Admitting: Obstetrics and Gynecology

## 2022-01-18 ENCOUNTER — Ambulatory Visit (INDEPENDENT_AMBULATORY_CARE_PROVIDER_SITE_OTHER): Payer: 59 | Admitting: Podiatry

## 2022-01-18 ENCOUNTER — Ambulatory Visit (INDEPENDENT_AMBULATORY_CARE_PROVIDER_SITE_OTHER): Payer: 59

## 2022-01-18 VITALS — BP 125/86 | HR 66

## 2022-01-18 DIAGNOSIS — M2012 Hallux valgus (acquired), left foot: Secondary | ICD-10-CM

## 2022-01-18 DIAGNOSIS — M21611 Bunion of right foot: Secondary | ICD-10-CM | POA: Diagnosis not present

## 2022-01-18 DIAGNOSIS — N812 Incomplete uterovaginal prolapse: Secondary | ICD-10-CM | POA: Diagnosis not present

## 2022-01-18 DIAGNOSIS — M216X2 Other acquired deformities of left foot: Secondary | ICD-10-CM

## 2022-01-18 DIAGNOSIS — N3281 Overactive bladder: Secondary | ICD-10-CM | POA: Diagnosis not present

## 2022-01-18 DIAGNOSIS — N8110 Cystocele, unspecified: Secondary | ICD-10-CM

## 2022-01-18 DIAGNOSIS — M21612 Bunion of left foot: Secondary | ICD-10-CM

## 2022-01-18 DIAGNOSIS — S82892G Other fracture of left lower leg, subsequent encounter for closed fracture with delayed healing: Secondary | ICD-10-CM

## 2022-01-18 DIAGNOSIS — N393 Stress incontinence (female) (male): Secondary | ICD-10-CM

## 2022-01-18 MED ORDER — SOLIFENACIN SUCCINATE 5 MG PO TABS: 5.0000 mg | ORAL_TABLET | Freq: Every day | ORAL | 5 refills | Status: DC

## 2022-01-18 NOTE — Progress Notes (Signed)
Subjective:  Patient ID: Erin Dennis, female    DOB: 09/22/1980,  MRN: [id number]  Chief Complaint  Patient presents with   Fracture    Left tibia, MRI review    41 y.o. female presents with the above complaint. History confirmed with patient.  She returns today for follow-up she still like to have her bunions corrected at some point but she has a new left ankle injury.  On Saturday she was in [place] with her husband she walked down a rocky embankment and slipped and rolled her ankle.  She had immediate pain and began to swell and had quite a bit of bruising.  She went to the ER yesterday and they were concerned that she have some fracture.    Interval history: Since last visit she completed the MRI.  She says the ankle is more painful in the boot, shoes are painful because the bunions are severe as well Objective:  Physical Exam: warm, good capillary refill, no trophic changes or ulcerative lesions, normal DP and PT pulses, and normal sensory exam. Left ankle: Today only pain in the posterior lateral ankle and the left foot bunion.  No ecchymosis, minimal edema today, she is ambulating regular shoe gear   Radiographs: Multiple views x-ray of left ankle new films taken today show bone callus formation around the posterior malleolar fracture, there is moderate to severe hallux valgus deformity with severe intermetatarsal angle increase and a slight hallux interphalangeus deformity as well Assessment:   1. Closed fracture of left ankle with delayed healing, subsequent encounter   2. Hallux valgus with bunions, left   3. Acquired hallux interphalangeus, left      Plan:  Patient was evaluated and treated and all questions answered.  We reviewed the results of the MRI discussed that she has a nondisplaced posterior malleolar fracture, on today's x-rays there is evidence of secondary bone healing occurring I do not see any evidence of significant displacement of the fracture  fragment.  Her MRI does not show any associated ligamentous tearing syndesmotic injury or sprain.  I recommend continued nonoperative treatment with WBAT in a cam boot.  She has not been wearing the boot and says it is more painful.  I dispensed a Tri-Lock ankle brace to see if this is somewhat supportive because I do not think regular shoe gear alone is advisable although my primary medical advice which I reiterated with her is that she needs to be in the cam boot.  Would like to rex-ray the ankle in 1 month for follow-up, continue RICE and support using the compression sleeve   Discussed the etiology and treatment including surgical and non surgical treatment for painful bunions.  She has exhausted all non surgical treatment prior to this visit including shoe gear changes and padding.  She desires surgical intervention. We discussed all risks including but not limited to: pain, swelling, infection, scar, numbness which may be temporary or permanent, chronic pain, stiffness, nerve pain or damage, wound healing problems, bone healing problems including delayed or non-union and recurrence. Specifically we discussed the following procedures: Bunion correction with Lapidus bunionectomy, possible Akin osteotomy and bone graft from the heel. Informed consent was signed today. Surgery will be scheduled at a mutually agreeable date. Information regarding this will be forwarded to our surgery scheduler.   Surgical plan:  Procedure: -Left foot Bunion correction with Lapidus bunionectomy, possible Akin osteotomy and bone graft from the heel  Location: -[place]  Anesthesia plan: -IV sedation with regional block  Postoperative pain plan: - Tylenol 1000 mg every 6 hours, ibuprofen 600 mg every 6 hours, gabapentin 300 mg every 8 hours x5 days, oxycodone 5 mg 1-2 tabs every 6 hours only as needed  DVT prophylaxis: -ASA 325 mg twice daily  WB Restrictions / DME needs: -NWB in CAM boot which she already  has   No follow-ups on file.

## 2022-01-18 NOTE — Patient Instructions (Addendum)
We discussed the symptoms of overactive bladder (OAB), which include urinary urgency, urinary frequency, night-time urination, with or without urge incontinence.  We discussed management including behavioral therapy (decreasing bladder irritants by following a bladder diet, urge suppression strategies, timed voids, bladder retraining), physical therapy, medication; and for refractory cases posterior tibial nerve stimulation, sacral neuromodulation, and intravesical botulinum toxin injection.   For anticholinergic medications, we discussed the potential side effects of anticholinergics including dry eyes, dry mouth, constipation, rare risks of cognitive impairment and urinary retention. You were given an RX for [employer].   It can take a month to start working so give it time, but if you have bothersome side effects call sooner and we can try a different medication.  Call us if you have trouble filling the prescription or if it's not covered by your insurance.

## 2022-01-25 ENCOUNTER — Encounter: Payer: Self-pay | Admitting: Obstetrics and Gynecology

## 2022-02-14 ENCOUNTER — Telehealth: Payer: 59 | Admitting: Physician Assistant

## 2022-02-14 DIAGNOSIS — L551 Sunburn of second degree: Secondary | ICD-10-CM

## 2022-02-14 MED ORDER — PREDNISONE 10 MG PO TABS: 40.0000 mg | ORAL_TABLET | Freq: Every day | ORAL | 0 refills | Status: AC

## 2022-02-14 MED ORDER — SILVER SULFADIAZINE 1 % EX CREA: 1.0000 | TOPICAL_CREAM | Freq: Every day | CUTANEOUS | 0 refills | Status: DC

## 2022-02-14 NOTE — Patient Instructions (Signed)
Erin Dennis, thank you for joining [name], PA-C for today's virtual visit.  While this provider is not your primary care provider (PCP), if your PCP is located in our provider database this encounter information will be shared with them immediately following your visit.  Consent: (Patient) Erin Dennis provided verbal consent for this virtual visit at the beginning of the encounter.  Current Medications:  Current Outpatient Medications:    predniSONE (DELTASONE) 10 MG tablet, Take 4 tablets (40 mg total) by mouth daily with breakfast for 3 days., Disp: 12 tablet, Rfl: 0   silver sulfADIAZINE (SILVADENE) 1 % cream, Apply 1 Application topically daily., Disp: 50 g, Rfl: 0   acitretin (SORIATANE) 10 MG capsule, Take 1 capsule (10 mg total) by mouth daily before breakfast., Disp: 90 capsule, Rfl: 3   solifenacin (VESICARE) 5 MG tablet, Take 1 tablet (5 mg total) by mouth daily., Disp: 30 tablet, Rfl: 5   Medications ordered in this encounter:  Meds ordered this encounter  Medications   silver sulfADIAZINE (SILVADENE) 1 % cream    Sig: Apply 1 Application topically daily.    Dispense:  50 g    Refill:  0    Order Specific Question:   Supervising Provider    Answer:   MILLER, BRIAN [[id number]]   predniSONE (DELTASONE) 10 MG tablet    Sig: Take 4 tablets (40 mg total) by mouth daily with breakfast for 3 days.    Dispense:  12 tablet    Refill:  0    Order Specific Question:   Supervising Provider    Answer:   Sabra Heck, BRIAN [[id number]]     *If you need refills on other medications prior to your next appointment, please contact your pharmacy*  Follow-Up: Call back or seek an in-person evaluation if the symptoms worsen or if the condition fails to improve as anticipated.  Other Instructions   Most sunburn is a first degree burn that turns the skin pink or red.  It can be painful to touch.  If you stayed in the sun for a prolonged period this might have progressed to a second  degree burn with blistering!  Usually the pain and swelling starts after about 4 hours, peaks at 24 hours and begins to improve after 48 hours or about 2 days.  REMEMBER prolonged exposure to the sun increases your risk of skin cancer so use sunscreen before you go outside!  We will give you more information about sunscreen use later in your care plan.  Your sunburn can be managed by self-care at home.  Please use the following care guide to manage your sunburn.  If you symptoms worsen, you have other questions or concerns, or you develop any of the warnings signs listed in your care plan you will need to seek a face to face visit with a provider without waiting!  Home Care Advice for Treating Mild Sunburn:  Take Ibuprofen (Advil, Motrin) for pain relief as soon as possible.  The adult dosage is up to 600 mg every 6 hours.  Starting within 6 hours of sun exposure may greatly reduce your discomfort.  If you cannot take Ibuprofen you may use Acetaminophen instead. Do not take Ibuprofen if you have stomach problems, kidney disease or are pregnant. Do not take Ibuprofen if you have been told by your doctor or pharmacist to avoid this class of drugs. Do not take Acetaminophen if you have liver disease. Read the package warnings on any medication that you  take!  2.  Use a steroid cream on the affected skin.  If you apply an over the counter steroid       cream as soon as possible and repeat it three times a day it may reduce the pain and      and swelling.  Until you get the steroid cream you may start with a moistening cream      cream or aloe gel.  3. For second or third degree sunburn with painful blistering, you can use an over the         counter product Burn Jel Plus Pain Relieving Gel. Apply in a thick even layer over the     affected area not more than 3 to 4 times daily If you need to cover the area to protect      it from friction of clothing, you can use Moist Burn Pads such as Hydrogel  Burn Pads       which are available over the counter.  4.  Apply cool compresses to the burned areas several times a day.  5.  Avoid soap on the sunburned areas.  6.  Drink plenty of water.  It is easy to get dehydrated from prolong time in the sun      Outdoors.  7.  For any broken blisters: Trim off the dead skin with fine scissors.  It is wise to clean the scissor with alcohol before use. Apply antibiotic ointments to the blister.  Apply twice a day for three days.  There are triple antibiotic ointments with topical pain relievers available at stores. Caution:leave intact blisters alone.  They are protecting the skin and will allow it to heal.  8.  Taking Vitamin C orally may reduce sun damage to your skin.  Follow the      Instructions on the bottle.  The recommended adult dosage is 2 grams.  Call your provider if:  You feel very weak or have difficulty standing. Blister develops on your face. You become sensitive to light because of eye pain. Your skin looks infected (red streaks, puss or worsening tenderness after 48 hours. You feel you should be seen.  Preventing Sunburns:  Apply 20-30 SPF sunscreen to your skin before going into the sun. Reapply every 2-4 hours or after sweating or swimming. Sunscreens protect from sunburns but do not completely prevent skin damage.  Nancy Fetter exposure still increases your risk of premature aging and skin cancers.    If you have been instructed to have an in-person evaluation today at a local Urgent Care facility, please use the link below. It will take you to a list of all of our available [facility], including address, phone number and hours of operation. Please do not delay care.  [facility]  If you or a family member do not have a primary care provider, use the link below to schedule a visit and establish care. When you choose a [facility] primary care physician or advanced practice provider, you gain a long-term  partner in health. Find a Primary Care Provider  Learn more about [facility]'s in-office and virtual care options: [facility]

## 2022-02-14 NOTE — Progress Notes (Signed)
Virtual Visit Consent   Erin Dennis, you are scheduled for a virtual visit with a [facility] provider today. Just as with appointments in the office, your consent must be obtained to participate. Your consent will be active for this visit and any virtual visit you may have with one of our providers in the next 365 days. If you have a MyChart account, a copy of this consent can be sent to you electronically.  As this is a virtual visit, video technology does not allow for your provider to perform a traditional examination. This may limit your provider's ability to fully assess your condition. If your provider identifies any concerns that need to be evaluated in person or the need to arrange testing (such as labs, EKG, etc.), we will make arrangements to do so. Although advances in technology are sophisticated, we cannot ensure that it will always work on either your end or our end. If the connection with a video visit is poor, the visit may have to be switched to a telephone visit. With either a video or telephone visit, we are not always able to ensure that we have a secure connection.  By engaging in this virtual visit, you consent to the provision of healthcare and authorize for your insurance to be billed (if applicable) for the services provided during this visit. Depending on your insurance coverage, you may receive a charge related to this service.  I need to obtain your verbal consent now. Are you willing to proceed with your visit today? Erin Bobby has provided verbal consent on 02/14/2022 for a virtual visit (video or telephone). Erin Dennis, [place]  Date: 02/14/2022 7:06 PM  Virtual Visit via Video Note   I, Erin Dennis, connected with  Geralyn Flash  ([id number], [birth year]) on 02/14/22 at  7:00 PM EDT by a video-enabled telemedicine application and verified that I am speaking with the correct person using two identifiers.  Location: Patient: Virtual Visit Location  Patient: Home Provider: Virtual Visit Location Provider: Home Office   I discussed the limitations of evaluation and management by telemedicine and the availability of in person appointments. The patient expressed understanding and agreed to proceed.    History of Present Illness: Erin Dennis is a 41 y.o. who identifies as a female who was assigned female at birth, and is being seen today for sunburn of her shoulders and arms occurring last week.  Notes initially blistering sunburn despite use of sunscreen on the areas. Pain is still present but improved compared to first few days. Had been applying some old Silvadene cream on the area which has helped but has ran out. Is wondering about a possible small refill. 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   History of gestational diabetes 07/30/2020   Encounter for female sterilization procedure 06/03/2020   Depression 09/16/2008    Allergies:  Allergies  Allergen Reactions   Acetaminophen Anaphylaxis and Rash   5-Alpha Reductase Inhibitors    Sulfamethoxazole-Trimethoprim Other (See Comments)    Blisters   Tape Rash   Medications:  Current Outpatient Medications:    predniSONE (DELTASONE) 10 MG tablet, Take 4 tablets (40 mg total) by  mouth daily with breakfast for 3 days., Disp: 12 tablet, Rfl: 0   silver sulfADIAZINE (SILVADENE) 1 % cream, Apply 1 Application topically daily., Disp: 50 g, Rfl: 0   acitretin (SORIATANE) 10 MG capsule, Take 1 capsule (10 mg total) by mouth daily before breakfast., Disp: 90 capsule, Rfl: 3   solifenacin (VESICARE) 5 MG tablet, Take 1 tablet (5 mg total) by mouth daily., Disp: 30  tablet, Rfl: 5  Observations/Objective: Patient is well-developed, well-nourished in no acute distress.  Resting comfortably at home.  Head is normocephalic, atraumatic.  No labored breathing. Speech is clear and coherent with logical content.  Patient is alert and oriented at baseline.  Tops of shoulders and neck with noted sunburn. Cannot appreciate any residual blistering lesions. No visual evidence of superimposed infection.   Assessment and Plan: 1. Sunburn of second degree - silver sulfADIAZINE (SILVADENE) 1 % cream; Apply 1 Application topically daily.  Dispense: 50 g; Refill: 0 - predniSONE (DELTASONE) 10 MG tablet; Take 4 tablets (40 mg total) by mouth daily with breakfast for 3 days.  Dispense: 12 tablet; Refill: 0  Silvadene refilled x 1. Discussed shortest duration of use. Supportive measures reviewed. Giving the continued discomfort, will give short burst of prednisone to help with this.   Follow Up Instructions: I discussed the assessment and treatment plan with the patient. The patient was provided an opportunity to ask questions and all were answered. The patient agreed with the plan and demonstrated an understanding of the instructions.  A copy of instructions were sent to the patient via MyChart unless otherwise noted below.   The patient was advised to call back or seek an in-person evaluation if the symptoms worsen or if the condition fails to improve as anticipated.  Time:  I spent 10 minutes with the patient via telehealth technology discussing the above problems/concerns.    Erin Rio, PA-C

## 2022-02-22 ENCOUNTER — Ambulatory Visit (INDEPENDENT_AMBULATORY_CARE_PROVIDER_SITE_OTHER): Payer: Medicaid Other

## 2022-02-22 ENCOUNTER — Ambulatory Visit (INDEPENDENT_AMBULATORY_CARE_PROVIDER_SITE_OTHER): Payer: Medicaid Other | Admitting: Podiatry

## 2022-02-22 DIAGNOSIS — S82892G Other fracture of left lower leg, subsequent encounter for closed fracture with delayed healing: Secondary | ICD-10-CM

## 2022-02-22 DIAGNOSIS — M79672 Pain in left foot: Secondary | ICD-10-CM

## 2022-02-22 NOTE — Progress Notes (Signed)
  Subjective:  Patient ID: Erin Dennis, female    DOB: [birth year],  MRN: [id number]  Chief Complaint  Patient presents with   Fracture    Follow up left ankle fracture. Doing much better. Still has some stiffness and tenderness in the mornings    41 y.o. female presents with the above complaint. History confirmed with patient.  She returns today for follow-up she still like to have her bunions corrected at some point but she has a new left ankle injury.  On Saturday she was in [place] with her husband she walked down a rocky embankment and slipped and rolled her ankle.  She had immediate pain and began to swell and had quite a bit of bruising.  She went to the ER yesterday and they were concerned that she have some fracture.    Interval history: Feels like this turned a corner and is doing much better, she is back to regular shoes Objective:  Physical Exam: warm, good capillary refill, no trophic changes or ulcerative lesions, normal DP and PT pulses, and normal sensory exam. Left ankle: Today only pain in the posterior lateral ankle and the left foot bunion.  No ecchymosis, minimal edema today, she is ambulating regular shoe gear   Radiographs: Multiple views x-ray of left ankle new films taken today show continued bone callus around the posterior malleolar fracture, unchanged alignment of mortise no widening of syndesmotic interval Assessment:   1. Closed fracture of left ankle with delayed healing, subsequent encounter      Plan:  Patient was evaluated and treated and all questions answered.  Doing much better has had improvement in his back to regular shoe gear and activity.  We reviewed her x-rays together in there is still some osseous healing that needs to occur for the posterior malleolus fracture.  She may continue her activity as tolerated as long as she is not having significant pain or deficit the ankle.  We discussed the possibility that her ankle may develop  arthritis at some point and this will be treated symptomatically.   Surgery for her left foot bunion is noted below and already scheduled for October  Surgical plan:  Procedure: -Left foot Bunion correction with Lapidus bunionectomy, possible Akin osteotomy and bone graft from the heel  Location: -[facility]  Anesthesia plan: -IV sedation with regional block  Postoperative pain plan: - Tylenol 1000 mg every 6 hours, ibuprofen 600 mg every 6 hours, gabapentin 300 mg every 8 hours x5 days, oxycodone 5 mg 1-2 tabs every 6 hours only as needed  DVT prophylaxis: -ASA 325 mg twice daily  WB Restrictions / DME needs: -NWB in CAM boot which she already has   Return if symptoms worsen or fail to improve.

## 2022-03-01 ENCOUNTER — Ambulatory Visit (INDEPENDENT_AMBULATORY_CARE_PROVIDER_SITE_OTHER): Payer: 59 | Admitting: Obstetrics and Gynecology

## 2022-03-01 VITALS — BP 147/90 | HR 68 | Ht 61.0 in | Wt 157.0 lb

## 2022-03-01 DIAGNOSIS — R350 Frequency of micturition: Secondary | ICD-10-CM | POA: Diagnosis not present

## 2022-03-01 DIAGNOSIS — N393 Stress incontinence (female) (male): Secondary | ICD-10-CM | POA: Diagnosis not present

## 2022-03-01 LAB — POCT URINALYSIS DIPSTICK
Appearance: NORMAL
Bilirubin, UA: NEGATIVE
Blood, UA: NEGATIVE
Glucose, UA: NEGATIVE
Ketones, UA: NEGATIVE
Leukocytes, UA: NEGATIVE
Nitrite, UA: NEGATIVE
Protein, UA: NEGATIVE
Spec Grav, UA: 1.025 (ref 1.010–1.025)
Urobilinogen, UA: 0.2 E.U./dL
pH, UA: 5.5 (ref 5.0–8.0)

## 2022-03-01 NOTE — Patient Instructions (Signed)

## 2022-03-03 NOTE — Progress Notes (Signed)
[facility] Urodynamics Procedure  Referring Physician: [facility] Date of Procedure: 03/01/2022  Erin Dennis is a 41 y.o. female who presents for urodynamic evaluation. Indication(s) for study: mixed incontinence  Vital Signs: BP (!) 147/90   Pulse 68   Ht 5\' 1"  (1.549 m)   Wt 157 lb (71.2 kg)   BMI 29.66 kg/m   Laboratory Results: A catheterized urine specimen revealed:  POC urine: negative  Voiding Diary: Not performed  Procedure Timeout:  The correct patient was verified and the correct procedure was verified. The patient was in the correct position and safety precautions were reviewed based on at the patient's history.  Urodynamic Procedure A 55F dual lumen urodynamics catheter was placed under sterile conditions into the patient's bladder. A 55F catheter was placed into the rectum in order to measure abdominal pressure. EMG patches were placed in the appropriate position.  All connections were confirmed and calibrations/adjusted made. Saline was instilled into the bladder through the dual lumen catheters.  Cough/valsalva pressures were measured periodically during filling.  Patient was allowed to void.  The bladder was then emptied of its residual.  UROFLOW: Revealed a Qmax of 12 mL/sec.  She voided 43 mL and had a residual of 4 mL.  It was a interrupted pattern and represented normal habits though interpretation limited due to low voided volume.  CMG: This was performed with sterile water in the sitting position at a fill rate of 20- 30 mL/min.    First sensation of fullness was 92 mLs,  First urge was 125 mLs,  Strong urge was 168 mLs and  Capacity was 282 mLs  Stress incontinence was demonstrated Lowest positive Barrier CLPP was 149 cmH20 at 282 ml. Highest negative Barrier VLPP was 109 cmH20 at 282 ml.  Detrusor function was normal, with no phasic contractions seen.    Compliance:  normal. End fill detrusor pressure was 0.6 cmH20.   Calculated compliance was 239mL/cmH20  UPP: MUCP with barrier reduction was 57 cm of water.    MICTURITION STUDY: Voiding was performed with reduction using scopettes in the sitting position.  Pdet at Qmax was 13.1 cm of water.  Qmax was 5.9 mL/sec.  It was a interrupted pattern.  She voided 266 mL and had a residual of 10 mL.  It was a volitional void, sustained but low amplitude detrusor contraction was present and abdominal straining was not present  EMG: This was performed with patches.  She had voluntary contractions, recruitment with fill was not present and urethral sphincter was relaxed with void.  The details of the procedure with the study tracings have been scanned into EPIC.   Urodynamic Impression:  1. Sensation was normal; capacity was reduced 2. Stress Incontinence was demonstrated at normal pressures; 3. Detrusor Overactivity was not demonstrated. 4. Emptying was normal with a normal PVR, a sustained detrusor contraction present,  abdominal straining not present, normal urethral sphincter activity on EMG.  Plan: - The patient will follow up  to discuss the findings and treatment options.

## 2022-04-19 ENCOUNTER — Telehealth: Payer: Self-pay | Admitting: Urology

## 2022-04-19 NOTE — Telephone Encounter (Signed)
DOS - 05/06/22  LAPIDUS PROCEDURE INCLUDING BUNIONECTOMY LEFT --- [id number] Barbie Banner OSTEOTOMY LEFT --- [id number] BONE GRAFT LEFT --- [id number]   [facility] EFFECTIVE DATE - 01/29/22  PLAN DEDUCTIBLE - $0.00 OUT OF POCKET - $0.00 COINSURANCE - 0% COPAY - $8.64   ALICIA G. WITH [employer] CALLED ME TO INFORM ME THAT CPT CODES 84720, 28310 AND 72182 HAVE BEEN APPROVED, AUTH # [id number], GOOD FROM 05/06/22 - 08/04/21.

## 2022-05-01 ENCOUNTER — Encounter: Payer: Self-pay | Admitting: Podiatry

## 2022-05-06 ENCOUNTER — Encounter: Payer: Self-pay | Admitting: Podiatry

## 2022-05-06 DIAGNOSIS — M2012 Hallux valgus (acquired), left foot: Secondary | ICD-10-CM

## 2022-05-06 DIAGNOSIS — M89772 Major osseous defect, left ankle and foot: Secondary | ICD-10-CM

## 2022-05-06 HISTORY — PX: FOOT FRACTURE SURGERY: SHX645

## 2022-05-06 MED ORDER — IBUPROFEN 600 MG PO TABS: 600.0000 mg | ORAL_TABLET | Freq: Four times a day (QID) | ORAL | 0 refills | Status: AC | PRN

## 2022-05-06 MED ORDER — GABAPENTIN 300 MG PO CAPS: 300.0000 mg | ORAL_CAPSULE | Freq: Three times a day (TID) | ORAL | 0 refills | Status: DC

## 2022-05-06 MED ORDER — OXYCODONE HCL 5 MG PO TABS: 5.0000 mg | ORAL_TABLET | ORAL | 0 refills | Status: AC | PRN

## 2022-05-07 ENCOUNTER — Telehealth: Payer: Self-pay | Admitting: Podiatry

## 2022-05-07 NOTE — Telephone Encounter (Signed)
Patient called answering service had some bleeding from heel incision, advised to elevate significantly and reinforce with gauze maintain pressure with Ace wrap.  Also reviewed which pain medication she should be taking regularly versus as needed

## 2022-05-09 ENCOUNTER — Ambulatory Visit (INDEPENDENT_AMBULATORY_CARE_PROVIDER_SITE_OTHER): Payer: Medicaid Other | Admitting: Podiatry

## 2022-05-09 ENCOUNTER — Telehealth: Payer: Self-pay | Admitting: *Deleted

## 2022-05-09 ENCOUNTER — Ambulatory Visit (INDEPENDENT_AMBULATORY_CARE_PROVIDER_SITE_OTHER): Payer: Medicaid Other

## 2022-05-09 DIAGNOSIS — M2012 Hallux valgus (acquired), left foot: Secondary | ICD-10-CM

## 2022-05-09 DIAGNOSIS — S82892G Other fracture of left lower leg, subsequent encounter for closed fracture with delayed healing: Secondary | ICD-10-CM

## 2022-05-09 DIAGNOSIS — M21612 Bunion of left foot: Secondary | ICD-10-CM

## 2022-05-09 NOTE — Telephone Encounter (Signed)
Patient had surgery on Friday, bleed thru bandages. Patient is scheduled for 3:45 today.

## 2022-05-09 NOTE — Patient Instructions (Signed)
On 10/16 you may remove the dressing and bathe the foot. Do not soak the foot or submerse in water.  Continue using the boot, must be on any time you are up and walking around. OK to remove boot while resting  Do not walk on the foot until our next appt on 10/26.

## 2022-05-10 ENCOUNTER — Encounter: Payer: Self-pay | Admitting: Podiatry

## 2022-05-12 ENCOUNTER — Encounter: Payer: 59 | Admitting: Podiatry

## 2022-05-12 NOTE — Progress Notes (Signed)
  Subjective:  Patient ID: Erin Dennis, female    DOB: 1980-08-25,  MRN: [id number]  Chief Complaint  Patient presents with   Post-op Problem     *Pt bleeding through her bandage.POV  DOS 05/06/2022 LAPIDUS BUNIONECTOMY, BONE GRAFT FROM HEEL, BONE CUT IN BIG TOE    41 y.o. female returns for post-op check.   Review of Systems: Negative except as noted in the HPI. Denies N/V/F/Ch.   Objective:  There were no vitals filed for this visit. There is no height or weight on file to calculate BMI. Constitutional Well developed. Well nourished.  Vascular Foot warm and well perfused. Capillary refill normal to all digits.  Calf is soft and supple, no posterior calf or knee pain, negative Homans' sign  Neurologic Normal speech. Oriented to person, place, and time. Epicritic sensation to light touch grossly present bilaterally.  Dermatologic Skin healing well without signs of infection. Skin edges well coapted without signs of infection.  No active bleeding noted there is dried blood in the bandage  Orthopedic: Tenderness to palpation noted about the surgical site.  Moderate edema   Multiple view plain film radiographs: Radiographs taken today show good correction noted and all hardware intact and equivalent to immediate postop films Assessment:   1. Hallux valgus with bunions, left    Plan:  Patient was evaluated and treated and all questions answered.  S/p foot surgery left -Progressing as expected post-operatively. -XR: Noted above no complication noted -WB Status: NWB in cam walker boot.  Boot has been uncomfortable for her.  We discussed the option of a cast which she declined -Sutures: Plan to remove in 2 weeks. -Medications: No refills required -Foot redressed.  No follow-ups on file.

## 2022-05-13 ENCOUNTER — Telehealth: Payer: Self-pay | Admitting: Podiatry

## 2022-05-13 ENCOUNTER — Ambulatory Visit (INDEPENDENT_AMBULATORY_CARE_PROVIDER_SITE_OTHER): Payer: Medicaid Other

## 2022-05-13 DIAGNOSIS — M21612 Bunion of left foot: Secondary | ICD-10-CM

## 2022-05-13 DIAGNOSIS — M2012 Hallux valgus (acquired), left foot: Secondary | ICD-10-CM

## 2022-05-13 MED ORDER — CEPHALEXIN 500 MG PO CAPS: 500.0000 mg | ORAL_CAPSULE | Freq: Three times a day (TID) | ORAL | 0 refills | Status: DC

## 2022-05-13 NOTE — Telephone Encounter (Signed)
Noted, thanks!

## 2022-05-13 NOTE — Telephone Encounter (Signed)
Pt is now scheduled to come in today 10/13 on nurse schedule for dressing changed for pov #2.

## 2022-05-13 NOTE — Progress Notes (Signed)
POV Dressing change DOS 05/06/2022 LAPIDUS BUNIONECTOMY, BONE GRAFT FROM HEEL, BONE CUT IN BIG TOE  Patient in office for dressing change. Patient reports discomfort in cam walker boot. During dressing change additional cushion was added for better comfort. Dressing applied with DSD, ABD pad and ACE wrap.   Keflex Rx sent to pharmacy per Dr. Vivia Ewing recommendation for prophylactic reasons.   Advised patient to monitor for signs and symptoms of infection, call the office or go to ED if noted. Patient verbalized understanding.

## 2022-05-16 ENCOUNTER — Telehealth: Payer: Self-pay | Admitting: Podiatry

## 2022-05-16 ENCOUNTER — Encounter: Payer: Self-pay | Admitting: Obstetrics and Gynecology

## 2022-05-16 NOTE — Telephone Encounter (Signed)
Noted, thanks!

## 2022-05-16 NOTE — Telephone Encounter (Signed)
Patient came in and had dressing changed on  05/13/22 should she take the new bandage off and start soaking it today or wait a few more days because it is still red and irritated ?  Please advise

## 2022-05-17 ENCOUNTER — Ambulatory Visit (INDEPENDENT_AMBULATORY_CARE_PROVIDER_SITE_OTHER): Payer: Medicaid Other | Admitting: Podiatry

## 2022-05-17 DIAGNOSIS — M2012 Hallux valgus (acquired), left foot: Secondary | ICD-10-CM

## 2022-05-17 DIAGNOSIS — M21612 Bunion of left foot: Secondary | ICD-10-CM

## 2022-05-17 DIAGNOSIS — G5792 Unspecified mononeuropathy of left lower limb: Secondary | ICD-10-CM

## 2022-05-17 MED ORDER — LIDOCAINE 5 % EX OINT: 1.0000 | TOPICAL_OINTMENT | CUTANEOUS | 0 refills | Status: DC | PRN

## 2022-05-17 MED ORDER — GABAPENTIN 300 MG PO CAPS: 300.0000 mg | ORAL_CAPSULE | Freq: Three times a day (TID) | ORAL | 0 refills | Status: DC

## 2022-05-17 NOTE — Telephone Encounter (Signed)
Pt seen today

## 2022-05-17 NOTE — Progress Notes (Signed)
  Subjective:  Patient ID: Erin Dennis, female    DOB: [birth year],  MRN: [id number]  No chief complaint on file.   41 y.o. female returns for post-op check.  She returned last week for adjustment of the boot and dressing because it was painful on her stitches and she is says it is sensitive.  Feels like burning and tingling.  Feels like the boot she was given last week is now too big  Review of Systems: Negative except as noted in the HPI. Denies N/V/F/Ch.   Objective:  There were no vitals filed for this visit. There is no height or weight on file to calculate BMI. Constitutional Well developed. Well nourished.  Vascular Foot warm and well perfused. Capillary refill normal to all digits.  Calf is soft and supple, no posterior calf or knee pain, negative Homans' sign  Neurologic Normal speech. Oriented to person, place, and time. Epicritic sensation to light touch grossly present bilaterally.  Dermatologic The incision remains well coapted.  There is no signs of infection.  There is sensitivity adjacent to the primary incision, none distally into the toe  Orthopedic: Tenderness to palpation noted about the surgical site.  Moderate edema   Multiple view plain film radiographs: Radiographs taken today show good correction noted and all hardware intact and equivalent to immediate postop films Assessment:   1. Neuritis of left lower extremity   2. Hallux valgus with bunions, left    Plan:  Patient was evaluated and treated and all questions answered.  S/p foot surgery left -We discussed again her postoperative course.  She does have some postoperative neuritis adjacent to the incision which is not uncommon.  We discussed that likely this will resolve with time.  She may benefit from continued treatment with gabapentin and topical medications with lidocaine ointment, I refilled prescriptions for both of these today.  I do think a large part of this is also due to the persistent  swelling, she has not been able to tolerate compression very well and she says she is icing and elevating which I encouraged her to continue.  She was placed back into the same boot she was in previously that she felt fit better.  Unfortunately sutures were not ready to be removed today I do think this will improve pain once these are out.  I will see her back next week for suture removal.  No follow-ups on file.

## 2022-05-17 NOTE — Telephone Encounter (Signed)
Noted, thanks!

## 2022-05-23 ENCOUNTER — Telehealth: Payer: Self-pay | Admitting: Podiatry

## 2022-05-23 ENCOUNTER — Encounter: Payer: Self-pay | Admitting: *Deleted

## 2022-05-23 NOTE — Telephone Encounter (Signed)
Called patient to switch her over to the nurse schedule for suture removal, she wants to see Dr Sherryle Lis 10/24 /23 the boot that she was give has rubbed her raw in certain areas.  She wants to know if she can be given  a shoe instead of the boot?

## 2022-05-23 NOTE — Telephone Encounter (Signed)
Per Dr. Maxie Barb las t message he told the patient that our surgical shoe straps will also go across her surgical site and it will not give her enough support like the cam boot will. Dr. Sherryle Lis what are your suggestions?

## 2022-05-24 ENCOUNTER — Encounter: Payer: Self-pay | Admitting: Obstetrics and Gynecology

## 2022-05-24 ENCOUNTER — Encounter: Payer: Self-pay | Admitting: Podiatry

## 2022-05-24 ENCOUNTER — Ambulatory Visit (INDEPENDENT_AMBULATORY_CARE_PROVIDER_SITE_OTHER): Payer: 59 | Admitting: Obstetrics and Gynecology

## 2022-05-24 ENCOUNTER — Ambulatory Visit (INDEPENDENT_AMBULATORY_CARE_PROVIDER_SITE_OTHER): Payer: 59 | Admitting: Podiatry

## 2022-05-24 VITALS — BP 150/112 | HR 65 | Wt 160.0 lb

## 2022-05-24 DIAGNOSIS — Z01818 Encounter for other preprocedural examination: Secondary | ICD-10-CM | POA: Diagnosis not present

## 2022-05-24 DIAGNOSIS — M2012 Hallux valgus (acquired), left foot: Secondary | ICD-10-CM

## 2022-05-24 DIAGNOSIS — N812 Incomplete uterovaginal prolapse: Secondary | ICD-10-CM | POA: Diagnosis not present

## 2022-05-24 DIAGNOSIS — N8110 Cystocele, unspecified: Secondary | ICD-10-CM

## 2022-05-24 DIAGNOSIS — M21612 Bunion of left foot: Secondary | ICD-10-CM

## 2022-05-24 MED ORDER — POLYETHYLENE GLYCOL 3350 17 GM/SCOOP PO POWD: 17.0000 g | Freq: Every day | ORAL | 0 refills | Status: DC

## 2022-05-24 MED ORDER — OXYCODONE HCL 5 MG PO TABS: 5.0000 mg | ORAL_TABLET | ORAL | 0 refills | Status: DC | PRN

## 2022-05-24 MED ORDER — IBUPROFEN 600 MG PO TABS: 600.0000 mg | ORAL_TABLET | Freq: Four times a day (QID) | ORAL | 0 refills | Status: DC | PRN

## 2022-05-24 NOTE — Progress Notes (Signed)
  Subjective:  Patient ID: Erin Dennis, female    DOB: 1981-03-10,  MRN: [id number]  Chief Complaint  Patient presents with   Routine Post Op    POV #2 DOS 05/06/2022 LAPIDUS BUNIONECTOMY, BONE GRAFT FROM HEEL, BONE CUT IN BIG TOE    "Its fine, but this boot is rubbing my leg."    41 y.o. female returns for post-op check.  Still having issues with the boot rubbing the leg is caused a low bit of a rash.  She saw her dermatologist and they placed her on a topical steroid.  Review of Systems: Negative except as noted in the HPI. Denies N/V/F/Ch.   Objective:  There were no vitals filed for this visit. There is no height or weight on file to calculate BMI. Constitutional Well developed. Well nourished.  Vascular Foot warm and well perfused. Capillary refill normal to all digits.  Calf is soft and supple, no posterior calf or knee pain, negative Homans' sign  Neurologic Normal speech. Oriented to person, place, and time. Epicritic sensation to light touch grossly present bilaterally.  No signs of CRPS type I or II  Dermatologic Incision has healed completely, sensitivity has improved.  Ecchymosis resolved  Orthopedic: Tenderness to palpation noted about the surgical site.  Moderate edema mostly limited to the great toe.  Stiffness of MTPJ   Multiple view plain film radiographs: Radiographs taken show good correction noted and all hardware intact and equivalent to immediate postop films Assessment:   1. Hallux valgus with bunions, left     Plan:  Patient was evaluated and treated and all questions answered.  S/p foot surgery left -She has had some improvement.  All sutures removed today.  Skin is healing well.  We discussed that she is not able to return to a surgical shoe at this point still needs to be in boot to protect surgical site.  I will see her back in 4 weeks for new x-rays.  If good bone healing progress was noted on her x-rays she may transition to a surgical shoe at that  point.  No follow-ups on file.

## 2022-05-24 NOTE — Progress Notes (Signed)
[name] Pre-Operative visit  Subjective Chief Complaint: Erin Dennis presents for a preoperative encounter.   History of Present Illness: Erin Dennis is a 41 y.o. female who presents for preoperative visit.  She is scheduled to undergo Robotic assisted total laparoscopic hysterectomy, bilateral salpingectomy, sacrocolpopexy, urethral bulking, cystoscopy on 06/14/22.  Her symptoms include vaginal bulge and incontinence, and she was was found to have Stage II anterior, Stage I posterior, Stage I apical prolapse. Has a history of midurethral sling placed 09/2021.   Urodynamic Impression:  1. Sensation was normal; capacity was reduced 2. Stress Incontinence was demonstrated at normal pressures; 3. Detrusor Overactivity was not demonstrated. 4. Emptying was normal with a normal PVR, a sustained detrusor contraction present,  abdominal straining not present, normal urethral sphincter activity on EMG.  Past Medical History:  Diagnosis Date   Anxiety    Darier disease    Depression    following miscarriage   Febrile seizure ([name])    Infection    UTI   Ovarian cyst    Personal history of gestational diabetes 2022     Past Surgical History:  Procedure Laterality Date   ANTERIOR AND POSTERIOR REPAIR WITH SACROSPINOUS FIXATION N/A 09/06/2021   Procedure: ANTERIOR AND POSTERIOR REPAIR WITH PERINEORRHAPHY;  Surgeon: [name], MD;  Location: [facility];  Service: Gynecology;  Laterality: N/A;   APPENDECTOMY     BLADDER SUSPENSION N/A 09/06/2021   Procedure: TRANSVAGINAL TAPE (TVT) PROCEDURE;  Surgeon: [name], MD;  Location: [facility];  Service: Gynecology;  Laterality: N/A;   CHOLECYSTECTOMY     CHOLECYSTECTOMY     CYSTOSCOPY N/A 09/06/2021   Procedure: CYSTOSCOPY;  Surgeon: [name], MD;  Location: [facility];  Service: Gynecology;  Laterality: N/A;   DILATATION AND  CURETTAGE/HYSTEROSCOPY WITH MINERVA N/A 12/09/2020   Procedure: DILATATION AND CURETTAGE/HYSTEROSCOPY WITH ATTEMPTED  MINERVA  ENDOMETRIAL ABLATION;  Surgeon: [name], MD;  Location: [facility];  Service: Gynecology;  Laterality: N/A;   DILATION AND CURETTAGE OF UTERUS     for menorrhagia   DILATION AND CURETTAGE OF UTERUS N/A 10/07/2019   Procedure: SUCTION DILATATION AND CURETTAGE;  Surgeon: [name], MD;  Location: [facility];  Service: Gynecology;  Laterality: N/A;  Dr. Request Time 9:30am   LAPAROSCOPIC BILATERAL SALPINGECTOMY Bilateral 12/09/2020   Procedure: LAPAROSCOPIC BILATERAL SALPINGECTOMY;  Surgeon: [name], MD;  Location: [facility];  Service: Gynecology;  Laterality: Bilateral;   OVARIAN CYST SURGERY     x3   TONSILLECTOMY     TONSILLECTOMY  2007   WISDOM TOOTH EXTRACTION      is allergic to acetaminophen, 5-alpha reductase inhibitors, sulfamethoxazole-trimethoprim, and tape.   Family History  Problem Relation Age of Onset   Diabetes Mother    Hypertension Mother    Diabetes Father    Stroke Father    Cancer Maternal Grandmother        ovarian   Jaundice Son     Social History   Tobacco Use   Smoking status: Never   Smokeless tobacco: Never  Vaping Use   Vaping Use: Never used  Substance Use Topics   Alcohol use: Not Currently   Drug use: Not Currently     Review of Systems was negative for a full 10 system review except as noted in the History of Present Illness.   Current Outpatient Medications:    acitretin (SORIATANE) 10 MG capsule, Take 1 capsule (10 mg  total) by mouth daily before breakfast., Disp: 90 capsule, Rfl: 3   gabapentin (NEURONTIN) 300 MG capsule, Take 1 capsule (300 mg total) by mouth 3 (three) times daily., Disp: 90 capsule, Rfl: 0   lidocaine (LIDODERM) 5 %, SMARTSIG:Topical, Disp: , Rfl:    silver sulfADIAZINE (SILVADENE) 1 % cream, Apply 1 Application topically daily., Disp: 50 g, Rfl: 0   solifenacin (VESICARE) 5 MG tablet,  Take 1 tablet (5 mg total) by mouth daily., Disp: 30 tablet, Rfl: 5   Objective Vitals:   05/24/22 0928  BP: (!) 150/112  Pulse: 65    Gen: NAD CV: S1 S2 RRR Lungs: Clear to auscultation bilaterally Abd: soft, nontender   Previous Pelvic Exam showed: POP-Q   -1                                            Aa   -1                                           Ba   -6                                              C    4                                            Gh   4                                            Pb   11                                            tvl    -2                                            Ap   -2                                            Bp   -10                                              D      Assessment/ Plan  Assessment: The patient is a 41 y.o. year old scheduled to undergo Robotic assisted total laparoscopic hysterectomy, bilateral salpingectomy, sacrocolpopexy, urethral bulking, cystoscopy. Verbal consent was obtained for these procedures.  Plan: General Surgical Consent: The patient has previously  been counseled on alternative treatments, and the decision by the patient and provider was to proceed with the procedure listed above.  For all procedures, there are risks of bleeding, infection, damage to surrounding organs including but not limited to bowel, bladder, blood vessels, ureters and nerves, and need for further surgery if an injury were to occur. These risks are all low with minimally invasive surgery.   There are risks of numbness and weakness at any body site or buttock/rectal pain.  It is possible that baseline pain can be worsened by surgery, either with or without mesh. If surgery is vaginal, there is also a low risk of possible conversion to laparoscopy or open abdominal incision where indicated. Very rare risks include blood transfusion, blood clot, heart attack, pneumonia, or death.   There is also a risk of short-term  postoperative urinary retention with need to use a catheter. About half of patients need to go home from surgery with a catheter, which is then later removed in the office. The risk of long-term need for a catheter is very low. There is also a risk of worsening of overactive bladder.    Prolapse (with or without mesh): Risk factors for surgical failure  include things that put pressure on your pelvis and the surgical repair, including obesity, chronic cough, and heavy lifting or straining (including lifting children or adults, straining on the toilet, or lifting heavy objects such as furniture or anything weighing >25 lbs. Risks of recurrence is 20-30% with vaginal native tissue repair and a less than 10% with sacrocolpopexy with mesh.    Sacrocolpopexy: Mesh implants may provide more prolapse support, but do have some unique risks to consider. It is important to understand that mesh is permanent and cannot be easily removed. Risks of abdominal sacrocolpopexy mesh include mesh exposure (~3-6%), painful intercourse (recent studies show lower rates after surgery compared to before, with ~5-8% risk of new onset), and very rare risks of bowel or bladder injury or infection (<1%). The risk of mesh exposure is more likely in a woman with risks for poor healing (prior radiation, poorly controlled diabetes, or immunocompromised). The risk of new or worsened chronic pain after mesh implant is more common in women with baseline chronic pain and/or poorly controlled anxiety or depression. There is an FDA safety notification on vaginal mesh procedures for prolapse but NOT abdominal mesh procedures and therefore does not apply to your surgery. We have extensive experience and training with mesh placement and we have close postoperative follow up to identify any potential complications from mesh.    We discussed consent for blood products. Risks for blood transfusion include allergic reactions, other reactions that can  affect different body organs and managed accordingly, transmission of infectious diseases such as HIV or Hepatitis. However, the blood is screened. Patient consents for blood products.  Pre-operative instructions:  She was instructed to not take Aspirin/NSAIDs x 7days prior to surgery. She may continue her 81mg  ASA. Antibiotic prophylaxis was ordered as indicated.  Catheter use: Patient will go home with foley if needed after post-operative voiding trial.  Post-operative instructions:  She was provided with specific post-operative instructions, including precautions and signs/symptoms for which we would recommend contacting us, in addition to daytime and after-hours contact phone numbers. This was provided on a handout.   Post-operative medications: Prescriptions for motrin,  miralax, and oxycodone were sent to her pharmacy. Discussed using ibuprofen on a schedule to limit use of narcotics.   Laboratory testing:  We will  check labs: CBC and type and screen  Preoperative clearance:  She does not require surgical clearance.    Post-operative follow-up:  A post-operative appointment will be made for 6 weeks from the date of surgery. If she needs a post-operative nurse visit for a voiding trial, that will be set up after she leaves the hospital.    Patient will call the clinic or use MyChart should anything change or any new issues arise.   [name], MD  Time spent: I spent 20 minutes dedicated to the care of this patient on the date of this encounter to include pre-visit review of records, face-to-face time with the patient and post visit documentation and ordering medication/ testing.

## 2022-05-25 ENCOUNTER — Encounter: Payer: Self-pay | Admitting: Podiatry

## 2022-05-25 NOTE — H&P (Signed)
[facility] Pre-Operative H&P  Subjective Chief Complaint: Erin Dennis presents for a preoperative encounter.   History of Present Illness: Erin Dennis is a 41 y.o. female who presents for preoperative visit.  She is scheduled to undergo Robotic assisted total laparoscopic hysterectomy, bilateral salpingectomy, sacrocolpopexy, urethral bulking, cystoscopy on 06/14/22.  Her symptoms include vaginal bulge and incontinence, and she was was found to have Stage II anterior, Stage I posterior, Stage I apical prolapse. Has a history of midurethral sling placed 09/2021.   Urodynamic Impression:  1. Sensation was normal; capacity was reduced 2. Stress Incontinence was demonstrated at normal pressures; 3. Detrusor Overactivity was not demonstrated. 4. Emptying was normal with a normal PVR, a sustained detrusor contraction present,  abdominal straining not present, normal urethral sphincter activity on EMG.  Past Medical History:  Diagnosis Date   Anxiety    Darier disease    Depression    following miscarriage   Febrile seizure ([place])    Infection    UTI   Ovarian cyst    Personal history of gestational diabetes 2022     Past Surgical History:  Procedure Laterality Date   ANTERIOR AND POSTERIOR REPAIR WITH SACROSPINOUS FIXATION N/A 09/06/2021   Procedure: ANTERIOR AND POSTERIOR REPAIR WITH PERINEORRHAPHY;  Surgeon: [name], MD;  Location: [facility];  Service: Gynecology;  Laterality: N/A;   APPENDECTOMY     BLADDER SUSPENSION N/A 09/06/2021   Procedure: TRANSVAGINAL TAPE (TVT) PROCEDURE;  Surgeon: [name], MD;  Location: [facility];  Service: Gynecology;  Laterality: N/A;   CHOLECYSTECTOMY     CHOLECYSTECTOMY     CYSTOSCOPY N/A 09/06/2021   Procedure: CYSTOSCOPY;  Surgeon: [name], MD;  Location: [facility];  Service: Gynecology;  Laterality: N/A;   DILATATION AND  CURETTAGE/HYSTEROSCOPY WITH MINERVA N/A 12/09/2020   Procedure: DILATATION AND CURETTAGE/HYSTEROSCOPY WITH ATTEMPTED  MINERVA  ENDOMETRIAL ABLATION;  Surgeon: [name], MD;  Location: [facility];  Service: Gynecology;  Laterality: N/A;   DILATION AND CURETTAGE OF UTERUS     for menorrhagia   DILATION AND CURETTAGE OF UTERUS N/A 10/07/2019   Procedure: SUCTION DILATATION AND CURETTAGE;  Surgeon: [name], MD;  Location: [facility];  Service: Gynecology;  Laterality: N/A;  Dr. Request Time 9:30am   LAPAROSCOPIC BILATERAL SALPINGECTOMY Bilateral 12/09/2020   Procedure: LAPAROSCOPIC BILATERAL SALPINGECTOMY;  Surgeon: [name], MD;  Location: [facility];  Service: Gynecology;  Laterality: Bilateral;   OVARIAN CYST SURGERY     x3   TONSILLECTOMY     TONSILLECTOMY  2007   WISDOM TOOTH EXTRACTION      is allergic to acetaminophen, 5-alpha reductase inhibitors, sulfamethoxazole-trimethoprim, and tape.   Family History  Problem Relation Age of Onset   Diabetes Mother    Hypertension Mother    Diabetes Father    Stroke Father    Cancer Maternal Grandmother        ovarian   Jaundice Son     Social History   Tobacco Use   Smoking status: Never   Smokeless tobacco: Never  Vaping Use   Vaping Use: Never used  Substance Use Topics   Alcohol use: Not Currently   Drug use: Not Currently     Review of Systems was negative for a full 10 system review except as noted in the History of Present Illness.  No current facility-administered medications for this encounter.  Current Outpatient Medications:    acitretin (SORIATANE) 10  MG capsule, Take 1 capsule (10 mg total) by mouth daily before breakfast., Disp: 90 capsule, Rfl: 3   gabapentin (NEURONTIN) 300 MG capsule, Take 1 capsule (300 mg total) by mouth 3 (three) times daily., Disp: 90 capsule, Rfl: 0   ibuprofen (ADVIL) 600 MG tablet, Take 1 tablet (600 mg total) by mouth every 6 (six) hours as needed., Disp: 30 tablet, Rfl: 0    lidocaine (LIDODERM) 5 %, SMARTSIG:Topical, Disp: , Rfl:    oxyCODONE (OXY IR/ROXICODONE) 5 MG immediate release tablet, Take 1 tablet (5 mg total) by mouth every 4 (four) hours as needed for severe pain., Disp: 15 tablet, Rfl: 0   polyethylene glycol powder (GLYCOLAX/MIRALAX) 17 GM/SCOOP powder, Take 17 g by mouth daily. Drink 17g (1 scoop) dissolved in water per day., Disp: 255 g, Rfl: 0   silver sulfADIAZINE (SILVADENE) 1 % cream, Apply 1 Application topically daily., Disp: 50 g, Rfl: 0   solifenacin (VESICARE) 5 MG tablet, Take 1 tablet (5 mg total) by mouth daily., Disp: 30 tablet, Rfl: 5   Objective There were no vitals filed for this visit.   Gen: NAD CV: S1 S2 RRR Lungs: Clear to auscultation bilaterally Abd: soft, nontender   Previous Pelvic Exam showed: POP-Q   -1                                            Aa   -1                                           Ba   -6                                              C    4                                            Gh   4                                            Pb   11                                            tvl    -2                                            Ap   -2                                            Bp   -10  D      Assessment/ Plan  The patient is a 41 y.o. year old scheduled to undergo Robotic assisted total laparoscopic hysterectomy, bilateral salpingectomy, sacrocolpopexy, urethral bulking, cystoscopy.    [name], MD

## 2022-05-26 ENCOUNTER — Encounter: Payer: 59 | Admitting: Podiatry

## 2022-05-27 ENCOUNTER — Telehealth: Payer: Self-pay | Admitting: *Deleted

## 2022-05-27 NOTE — Telephone Encounter (Signed)
Patient is asking if she can wear the brace given instead of the boot,is rubbing against her skin so bad, causing a blister(skin is super sensitive) and toe is still swollen.  Her dermatologist had suggested that wearing a cast would make things worse.  Please advise.

## 2022-05-27 NOTE — Telephone Encounter (Signed)
Called patient giving information per physician that she should wear boot and that she may purchased one from [employer] that she cannot wear it and how would she know before purchasing if that one would fit any better.

## 2022-06-01 ENCOUNTER — Encounter: Payer: Self-pay | Admitting: Podiatry

## 2022-06-01 ENCOUNTER — Ambulatory Visit (INDEPENDENT_AMBULATORY_CARE_PROVIDER_SITE_OTHER): Payer: 59 | Admitting: Podiatry

## 2022-06-01 ENCOUNTER — Ambulatory Visit (INDEPENDENT_AMBULATORY_CARE_PROVIDER_SITE_OTHER): Payer: Medicaid Other

## 2022-06-01 DIAGNOSIS — R600 Localized edema: Secondary | ICD-10-CM | POA: Diagnosis not present

## 2022-06-01 DIAGNOSIS — Z9889 Other specified postprocedural states: Secondary | ICD-10-CM | POA: Diagnosis not present

## 2022-06-01 NOTE — Telephone Encounter (Signed)
Noted, thanks!

## 2022-06-01 NOTE — Telephone Encounter (Signed)
Patient is complaining about her foot being swollen with pain. Patient states that she has been soaking it in cool temp water and doing the ice bath to help with the swelling.

## 2022-06-01 NOTE — Telephone Encounter (Signed)
Pt will come in today & be seen by Dr. Paulla Dolly

## 2022-06-01 NOTE — Telephone Encounter (Signed)
Patient is being seen by Dr. Paulla Dolly today.

## 2022-06-01 NOTE — Progress Notes (Signed)
Subjective:   Patient ID: Erin Dennis, female   DOB: 41 y.o.   MRN: [id number]   HPI Patient presents stating that the friction and skin irritation from wearing the boot is driving her crazy and that she has been soaking and putting ointment and feels like her foot has gotten more swollen.  She admits that this last week she has not been wearing her boot but has been wearing her crocs around the house and that she does not want to wear the boot anymore   ROS      Objective:  Physical Exam  Neurovascular status found to be intact negative Bevelyn Buckles' sign was noted patient's incisions are healing well there is a slight dehiscence on the distal portion of the proximal first metatarsal incision site left that she states has been there has not gotten worse there is been no redness no erythema no drainage surrounding it and she states it is due to friction.  Other incisions are healing well she does have significant edema in her forefoot and midfoot consistent with this surgery     Assessment:  Overall patient is doing well with the surgery but she has been noncompliant in wearing her crocs and is insisting that she cannot wear the boot anymore     Plan:  H&P precautionary x-ray since she walked on it without her protection left and reviewed.  I encouraged her about the importance of boot usage but she states she simply cannot tolerate it and I did go ahead and dispensed a rigid bottom surgical shoe today as I feel like this will be better than the croc but I still in a perfect world would like her to wear the boot.  Due to the swelling present I did go ahead and I placed her in an Unna boot for the forefoot ankle and lower calf to try to milk out some of the swelling and I advised her on leaving it on approximate 4 days and taking it off earlier if any issues were to occur.  I am hoping she can wear her boot with the Unna boot as it may protect her skin and she is going to try this and will see Dr.  Domenic Polite early next week  X-ray indicates that the plate and screws are in place no indications of motion or around the metatarsal cuneiform joint

## 2022-06-06 ENCOUNTER — Ambulatory Visit (INDEPENDENT_AMBULATORY_CARE_PROVIDER_SITE_OTHER): Payer: Medicaid Other | Admitting: Podiatry

## 2022-06-06 ENCOUNTER — Other Ambulatory Visit: Payer: Self-pay

## 2022-06-06 ENCOUNTER — Encounter (HOSPITAL_BASED_OUTPATIENT_CLINIC_OR_DEPARTMENT_OTHER): Payer: Self-pay | Admitting: Obstetrics and Gynecology

## 2022-06-06 ENCOUNTER — Encounter (HOSPITAL_COMMUNITY)
Admission: RE | Admit: 2022-06-06 | Discharge: 2022-06-06 | Disposition: A | Discharge status: Home / Self Care | Payer: Medicaid Other | Source: Ambulatory Visit | Attending: Obstetrics and Gynecology | Admitting: Obstetrics and Gynecology

## 2022-06-06 DIAGNOSIS — Z9889 Other specified postprocedural states: Secondary | ICD-10-CM

## 2022-06-06 DIAGNOSIS — Z01818 Encounter for other preprocedural examination: Secondary | ICD-10-CM | POA: Diagnosis present

## 2022-06-06 DIAGNOSIS — N184 Chronic kidney disease, stage 4 (severe): Secondary | ICD-10-CM | POA: Diagnosis not present

## 2022-06-06 DIAGNOSIS — M21612 Bunion of left foot: Secondary | ICD-10-CM

## 2022-06-06 DIAGNOSIS — Z91199 Patient's noncompliance with other medical treatment and regimen due to unspecified reason: Secondary | ICD-10-CM

## 2022-06-06 DIAGNOSIS — M2012 Hallux valgus (acquired), left foot: Secondary | ICD-10-CM

## 2022-06-06 DIAGNOSIS — N814 Uterovaginal prolapse, unspecified: Secondary | ICD-10-CM

## 2022-06-06 LAB — CBC
HCT: 46.5 % — ABNORMAL HIGH (ref 36.0–46.0)
Hemoglobin: 15 g/dL (ref 12.0–15.0)
MCH: 29.7 pg (ref 26.0–34.0)
MCHC: 32.3 g/dL (ref 30.0–36.0)
MCV: 92.1 fL (ref 80.0–100.0)
Platelets: 289 10*3/uL (ref 150–400)
RBC: 5.05 MIL/uL (ref 3.87–5.11)
RDW: 12.5 % (ref 11.5–15.5)
WBC: 8.3 10*3/uL (ref 4.0–10.5)
nRBC: 0 % (ref 0.0–0.2)

## 2022-06-06 NOTE — Progress Notes (Signed)
Your procedure is scheduled on Tuesday, 06/14/22.  Report to [facility]   Call this number if you have problems the morning of surgery  :[phone].   OUR ADDRESS IS [address].  WE ARE LOCATED IN [facility].  PLEASE BRING YOUR INSURANCE CARD AND PHOTO ID DAY OF SURGERY.  ONLY 2 PEOPLE ARE ALLOWED IN  WAITING  ROOM.                                      REMEMBER:  DO NOT EAT FOOD, CANDY GUM OR MINTS  AFTER MIDNIGHT THE NIGHT BEFORE YOUR SURGERY . YOU MAY HAVE CLEAR LIQUIDS FROM MIDNIGHT THE NIGHT BEFORE YOUR SURGERY UNTIL  8:00 AM. NO CLEAR LIQUIDS AFTER  8:00 AM DAY OF SURGERY.  YOU MAY  BRUSH YOUR TEETH MORNING OF SURGERY AND RINSE YOUR MOUTH OUT, NO CHEWING GUM CANDY OR MINTS.     CLEAR LIQUID DIET   Foods Allowed                                                                     Foods Excluded  Coffee and tea, regular and decaf                             liquids that you cannot  Plain Jell-O                                                                   see through such as: Fruit ices (not with fruit pulp)                                     milk, soups, orange juice  Plain  Popsicles                                    All solid food Carbonated beverages, regular and diet                                    Cranberry, grape and apple juices Sports drinks like Gatorade _____________________________________________________________________     TAKE ONLY THESE MEDICATIONS MORNING OF SURGERY:  Gabapentin, Oxycodone if needed    UP TO 4 VISITORS  MAY VISIT IN THE EXTENDED RECOVERY ROOM UNTIL 800 PM ONLY.  ONE  VISITOR AGE 87 AND OVER MAY SPEND THE NIGHT AND MUST BE IN EXTENDED RECOVERY ROOM NO LATER THAN 800 PM . YOUR DISCHARGE TIME AFTER YOU SPEND THE NIGHT IS 900 AM THE MORNING AFTER YOUR SURGERY.  YOU MAY PACK A SMALL OVERNIGHT BAG WITH TOILETRIES FOR  YOUR OVERNIGHT STAY IF YOU WISH.  YOUR PRESCRIPTION  MEDICATIONS WILL BE PROVIDED DURING [facility].                                      DO NOT WEAR JEWERLY, MAKE UP. DO NOT WEAR LOTIONS, POWDERS, PERFUMES OR NAIL POLISH ON YOUR FINGERNAILS. TOENAIL POLISH IS OK TO WEAR. DO NOT SHAVE FOR 48 HOURS PRIOR TO DAY OF SURGERY. MEN MAY SHAVE FACE AND NECK. CONTACTS, GLASSES, OR DENTURES MAY NOT BE WORN TO SURGERY.  REMEMBER: NO SMOKING, DRUGS OR ALCOHOL FOR 24 HOURS BEFORE YOUR SURGERY.                                    [facility] IS NOT RESPONSIBLE  FOR ANY BELONGINGS.                                                                    [name]           [facility] - Preparing for Surgery Before surgery, you can play an important role.  Because skin is not sterile, your skin needs to be as free of germs as possible.  You can reduce the number of germs on your skin by washing with CHG (chlorahexidine gluconate) soap before surgery.  CHG is an antiseptic cleaner which kills germs and bonds with the skin to continue killing germs even after washing. Please DO NOT use if you have an allergy to CHG or antibacterial soaps.  If your skin becomes reddened/irritated stop using the CHG and inform your nurse when you arrive at Short Stay. Do not shave (including legs and underarms) for at least 48 hours prior to the first CHG shower.  You may shave your face/neck. Please follow these instructions carefully:  1.  Shower with CHG Soap the night before surgery and the  morning of Surgery.  2.  If you choose to wash your hair, wash your hair first as usual with your  normal  shampoo.  3.  After you shampoo, rinse your hair and body thoroughly to remove the  shampoo.                                        4.  Use CHG as you would any other liquid soap.  You can apply chg directly  to the skin and wash , chg soap provided, night before and morning of your surgery.  5.  Apply the CHG Soap to your body ONLY FROM THE NECK DOWN.   Do not use on face/ open                            Wound or open sores. Avoid contact with eyes, ears mouth and genitals (private parts).                       Wash face,  Genitals (private parts) with your normal  soap.             6.  Wash thoroughly, paying special attention to the area where your surgery  will be performed.  7.  Thoroughly rinse your body with warm water from the neck down.  8.  DO NOT shower/wash with your normal soap after using and rinsing off  the CHG Soap.             9.  Pat yourself dry with a clean towel.            10.  Wear clean pajamas.            11.  Place clean sheets on your bed the night of your first shower and do not  sleep with pets. Day of Surgery : Do not apply any lotions/deodorants the morning of surgery.  Please wear clean clothes to the hospital/surgery center.  IF YOU HAVE ANY SKIN IRRITATION OR PROBLEMS WITH THE SURGICAL SOAP, PLEASE GET A BAR OF GOLD DIAL SOAP AND SHOWER THE NIGHT BEFORE YOUR SURGERY AND THE MORNING OF YOUR SURGERY. PLEASE LET THE NURSE KNOW MORNING OF YOUR SURGERY IF YOU HAD ANY PROBLEMS WITH THE SURGICAL SOAP.   ________________________________________________________________________                                                        QUESTIONS [facility] PRE OP NURSE PHONE [phone].

## 2022-06-06 NOTE — Progress Notes (Signed)
Spoke w/ via phone for pre-op interview---Erin Dennis needs dos---- urine pregnancy              Dennis results------06/06/22 appt for cbc, type & screen COVID test -----patient states asymptomatic no test needed Arrive at -------0900 on Tuesday, 06/14/2022 NPO after MN NO Solid Food.  Clear liquids from MN until---0800 Med rec completed Medications to take morning of surgery -----Gabapentin, Oxycodone prn Diabetic medication -----n/a Patient instructed no nail polish to be worn day of surgery Patient instructed to bring photo id and insurance card day of surgery Patient aware to have Driver (ride ) / caregiver    for 24 hours after surgery - mother, Erin Dennis Patient Special Instructions -----Extended / overnight stay instructions given. Pre-Op special Istructions ---none-- Patient verbalized understanding of instructions that were given at this phone interview. Patient denies shortness of breath, chest pain, fever, cough at this phone interview.

## 2022-06-06 NOTE — Progress Notes (Signed)
  Subjective:  Patient ID: Erin Dennis, female    DOB: [birth year],  MRN: [id number]  Chief Complaint  Patient presents with   Post-op Problem    Unna boot removal    41 y.o. female returns for post-op check.  She returns for follow-up today she is wearing regular shoes.  She saw Dr. Paulla Dolly last week and told him that she had not been wearing the cam boot and is wearing crocs at home.  He recommended utilizing at least a surgical shoe and an Unna boot for the edema.  She removed the Unna boot at 11 PM that night.  She says she feels better and the shoes she is wearing now, says that she wears the surgical shoe at home  Review of Systems: Negative except as noted in the HPI. Denies N/V/F/Ch.   Objective:  There were no vitals filed for this visit. There is no height or weight on file to calculate BMI. Constitutional Well developed. Well nourished.  Vascular Foot warm and well perfused. Capillary refill normal to all digits.  Calf is soft and supple, no posterior calf or knee pain, negative Homans' sign  Neurologic Normal speech. Oriented to person, place, and time. Epicritic sensation to light touch grossly present bilaterally.  No signs of CRPS type I or II  Dermatologic Incision is well-healed nonhypertrophic  Orthopedic: Still has moderate edema.  Warmth around fusion site.   Multiple view plain film radiographs: Radiographs taken last week showed no loss of correction currently Assessment:   1. Post-operative state   2. Hallux valgus with bunions, left   3. Noncompliance with treatment plan     Plan:  Patient was evaluated and treated and all questions answered.  S/p foot surgery left -I discussed with her so far she has not been able to be compliant with her postoperative treatment plan.  She is now wearing regular shoes only a month out from surgery and I recommended at least 8 weeks of postoperative boot or shoe for immobilization to protect the surgical site.  I  discussed with her that continuing this is putting her at risk of malunion, loss of correction, fracture of hardware and nonunion and this could result in further surgery being necessary.  I emphasized the importance of this with her.  She will return at week 8 for postoperative radiographs  No follow-ups on file.

## 2022-06-09 ENCOUNTER — Encounter (HOSPITAL_COMMUNITY): Payer: Medicaid Other

## 2022-06-13 ENCOUNTER — Encounter (HOSPITAL_BASED_OUTPATIENT_CLINIC_OR_DEPARTMENT_OTHER): Payer: Self-pay | Admitting: Obstetrics and Gynecology

## 2022-06-14 ENCOUNTER — Ambulatory Visit (HOSPITAL_BASED_OUTPATIENT_CLINIC_OR_DEPARTMENT_OTHER)
Admission: RE | Admit: 2022-06-14 | Discharge: 2022-06-14 | Disposition: A | Discharge status: Home / Self Care | Payer: Medicaid Other | Attending: Obstetrics and Gynecology | Admitting: Obstetrics and Gynecology

## 2022-06-14 ENCOUNTER — Other Ambulatory Visit: Payer: Self-pay

## 2022-06-14 ENCOUNTER — Ambulatory Visit (HOSPITAL_BASED_OUTPATIENT_CLINIC_OR_DEPARTMENT_OTHER): Payer: Medicaid Other | Admitting: Anesthesiology

## 2022-06-14 ENCOUNTER — Encounter (HOSPITAL_BASED_OUTPATIENT_CLINIC_OR_DEPARTMENT_OTHER): Payer: Self-pay | Admitting: Obstetrics and Gynecology

## 2022-06-14 ENCOUNTER — Encounter (HOSPITAL_BASED_OUTPATIENT_CLINIC_OR_DEPARTMENT_OTHER)
Admission: RE | Disposition: A | Discharge status: Home / Self Care | Payer: Self-pay | Source: Home / Self Care | Attending: Obstetrics and Gynecology

## 2022-06-14 DIAGNOSIS — R569 Unspecified convulsions: Secondary | ICD-10-CM | POA: Diagnosis not present

## 2022-06-14 DIAGNOSIS — Z6829 Body mass index (BMI) 29.0-29.9, adult: Secondary | ICD-10-CM | POA: Diagnosis not present

## 2022-06-14 DIAGNOSIS — N72 Inflammatory disease of cervix uteri: Secondary | ICD-10-CM | POA: Insufficient documentation

## 2022-06-14 DIAGNOSIS — Z01818 Encounter for other preprocedural examination: Secondary | ICD-10-CM

## 2022-06-14 DIAGNOSIS — N8111 Cystocele, midline: Secondary | ICD-10-CM | POA: Diagnosis not present

## 2022-06-14 DIAGNOSIS — N812 Incomplete uterovaginal prolapse: Secondary | ICD-10-CM

## 2022-06-14 DIAGNOSIS — N814 Uterovaginal prolapse, unspecified: Secondary | ICD-10-CM

## 2022-06-14 DIAGNOSIS — E669 Obesity, unspecified: Secondary | ICD-10-CM | POA: Insufficient documentation

## 2022-06-14 DIAGNOSIS — N393 Stress incontinence (female) (male): Secondary | ICD-10-CM | POA: Diagnosis not present

## 2022-06-14 DIAGNOSIS — N711 Chronic inflammatory disease of uterus: Secondary | ICD-10-CM | POA: Insufficient documentation

## 2022-06-14 HISTORY — PX: CYSTOSCOPY: SHX5120

## 2022-06-14 HISTORY — PX: XI ROBOTIC ASSISTED TOTAL HYSTERECTOMY WITH SACROCOLPOPEXY: SHX6825

## 2022-06-14 HISTORY — PX: IUD REMOVAL: SHX5392

## 2022-06-14 HISTORY — DX: Ankylosing hyperostosis (forestier), site unspecified: M48.10

## 2022-06-14 LAB — TYPE AND SCREEN
ABO/RH(D): O POS
Antibody Screen: NEGATIVE

## 2022-06-14 LAB — POCT PREGNANCY, URINE: Preg Test, Ur: NEGATIVE

## 2022-06-14 SURGERY — HYSTERECTOMY, TOTAL, ROBOT-ASSISTED, WITH SACROCOLPOPEXY
Anesthesia: General | Site: Pelvis

## 2022-06-14 MED ORDER — PROPOFOL 10 MG/ML IV BOLUS
INTRAVENOUS | Status: DC | PRN
  Administered 2022-06-14: 150 mg via INTRAVENOUS

## 2022-06-14 MED ORDER — OXYCODONE HCL 5 MG/5ML PO SOLN: 5.0000 mg | Freq: Once | ORAL | Status: DC | PRN

## 2022-06-14 MED ORDER — IBUPROFEN 200 MG PO TABS: 600.0000 mg | ORAL_TABLET | Freq: Four times a day (QID) | ORAL | Status: DC

## 2022-06-14 MED ORDER — SUGAMMADEX SODIUM 200 MG/2ML IV SOLN
INTRAVENOUS | Status: DC | PRN
  Administered 2022-06-14: 150 mg via INTRAVENOUS

## 2022-06-14 MED ORDER — LIDOCAINE HCL (CARDIAC) PF 100 MG/5ML IV SOSY
PREFILLED_SYRINGE | INTRAVENOUS | Status: DC | PRN
  Administered 2022-06-14: 60 mg via INTRAVENOUS

## 2022-06-14 MED ORDER — FENTANYL CITRATE (PF) 100 MCG/2ML IJ SOLN
INTRAMUSCULAR | Status: DC | PRN
  Administered 2022-06-14: 50 ug via INTRAVENOUS
  Administered 2022-06-14 (×2): 25 ug via INTRAVENOUS
  Administered 2022-06-14 (×2): 50 ug via INTRAVENOUS
  Administered 2022-06-14 (×2): 25 ug via INTRAVENOUS

## 2022-06-14 MED ORDER — HYDROMORPHONE HCL 1 MG/ML IJ SOLN
INTRAMUSCULAR | Status: AC
  Filled 2022-06-14: qty 1

## 2022-06-14 MED ORDER — KETOROLAC TROMETHAMINE 30 MG/ML IJ SOLN
INTRAMUSCULAR | Status: DC | PRN
  Administered 2022-06-14: 30 mg via INTRAVENOUS

## 2022-06-14 MED ORDER — ONDANSETRON HCL 4 MG/2ML IJ SOLN: 4.0000 mg | Freq: Once | INTRAMUSCULAR | Status: DC | PRN

## 2022-06-14 MED ORDER — PHENAZOPYRIDINE HCL 100 MG PO TABS
200.0000 mg | ORAL_TABLET | ORAL | Status: AC
  Administered 2022-06-14: 200 mg via ORAL

## 2022-06-14 MED ORDER — MIDAZOLAM HCL 2 MG/2ML IJ SOLN
INTRAMUSCULAR | Status: AC
  Filled 2022-06-14: qty 2

## 2022-06-14 MED ORDER — OXYCODONE HCL 5 MG PO TABS: 5.0000 mg | ORAL_TABLET | Freq: Once | ORAL | Status: DC | PRN

## 2022-06-14 MED ORDER — DEXAMETHASONE SODIUM PHOSPHATE 4 MG/ML IJ SOLN
INTRAMUSCULAR | Status: DC | PRN
  Administered 2022-06-14: 8 mg via INTRAVENOUS

## 2022-06-14 MED ORDER — HYDROMORPHONE HCL 1 MG/ML IJ SOLN
0.5000 mg | INTRAMUSCULAR | Status: DC | PRN
  Administered 2022-06-14 (×2): 0.5 mg via INTRAVENOUS

## 2022-06-14 MED ORDER — OXYCODONE HCL 5 MG PO TABS: 5.0000 mg | ORAL_TABLET | ORAL | Status: DC | PRN

## 2022-06-14 MED ORDER — GABAPENTIN 300 MG PO CAPS
300.0000 mg | ORAL_CAPSULE | ORAL | Status: AC
  Administered 2022-06-14: 300 mg via ORAL

## 2022-06-14 MED ORDER — MIDAZOLAM HCL 2 MG/2ML IJ SOLN
INTRAMUSCULAR | Status: DC | PRN
  Administered 2022-06-14: 2 mg via INTRAVENOUS

## 2022-06-14 MED ORDER — ONDANSETRON HCL 4 MG PO TABS: 4.0000 mg | ORAL_TABLET | Freq: Four times a day (QID) | ORAL | Status: DC | PRN

## 2022-06-14 MED ORDER — FENTANYL CITRATE (PF) 100 MCG/2ML IJ SOLN
25.0000 ug | INTRAMUSCULAR | Status: DC | PRN
  Administered 2022-06-14 (×2): 50 ug via INTRAVENOUS

## 2022-06-14 MED ORDER — CEFAZOLIN SODIUM-DEXTROSE 2-4 GM/100ML-% IV SOLN
2.0000 g | INTRAVENOUS | Status: AC
  Administered 2022-06-14: 2 g via INTRAVENOUS

## 2022-06-14 MED ORDER — SIMETHICONE 80 MG PO CHEW
CHEWABLE_TABLET | ORAL | Status: AC
  Filled 2022-06-14: qty 1

## 2022-06-14 MED ORDER — CEFAZOLIN SODIUM-DEXTROSE 2-4 GM/100ML-% IV SOLN
INTRAVENOUS | Status: AC
  Filled 2022-06-14: qty 100

## 2022-06-14 MED ORDER — ONDANSETRON HCL 4 MG/2ML IJ SOLN
INTRAMUSCULAR | Status: DC | PRN
  Administered 2022-06-14: 4 mg via INTRAVENOUS

## 2022-06-14 MED ORDER — WATER FOR IRRIGATION, STERILE IR SOLN
Status: DC | PRN
  Administered 2022-06-14: 500 mL

## 2022-06-14 MED ORDER — PHENAZOPYRIDINE HCL 100 MG PO TABS
ORAL_TABLET | ORAL | Status: AC
  Filled 2022-06-14: qty 2

## 2022-06-14 MED ORDER — BUPIVACAINE HCL (PF) 0.25 % IJ SOLN
INTRAMUSCULAR | Status: DC | PRN
  Administered 2022-06-14: 12 mL

## 2022-06-14 MED ORDER — KETOROLAC TROMETHAMINE 30 MG/ML IJ SOLN: 30.0000 mg | Freq: Once | INTRAMUSCULAR | Status: DC | PRN

## 2022-06-14 MED ORDER — GABAPENTIN 300 MG PO CAPS
ORAL_CAPSULE | ORAL | Status: AC
  Filled 2022-06-14: qty 1

## 2022-06-14 MED ORDER — SODIUM CHLORIDE 0.9 % IR SOLN
Status: DC | PRN
  Administered 2022-06-14: 1000 mL via INTRAVESICAL
  Administered 2022-06-14: 1000 mL

## 2022-06-14 MED ORDER — SODIUM CHLORIDE (PF) 0.9 % IJ SOLN
INTRAMUSCULAR | Status: AC
  Filled 2022-06-14: qty 10

## 2022-06-14 MED ORDER — OXYCODONE HCL 5 MG PO TABS
ORAL_TABLET | ORAL | Status: AC
  Filled 2022-06-14: qty 2

## 2022-06-14 MED ORDER — SIMETHICONE 80 MG PO CHEW: 80.0000 mg | CHEWABLE_TABLET | Freq: Four times a day (QID) | ORAL | Status: DC | PRN

## 2022-06-14 MED ORDER — FENTANYL CITRATE (PF) 100 MCG/2ML IJ SOLN
INTRAMUSCULAR | Status: AC
  Filled 2022-06-14: qty 2

## 2022-06-14 MED ORDER — FENTANYL CITRATE (PF) 250 MCG/5ML IJ SOLN
INTRAMUSCULAR | Status: AC
  Filled 2022-06-14: qty 5

## 2022-06-14 MED ORDER — EPHEDRINE SULFATE (PRESSORS) 50 MG/ML IJ SOLN
INTRAMUSCULAR | Status: DC | PRN
  Administered 2022-06-14 (×2): 10 mg via INTRAVENOUS

## 2022-06-14 MED ORDER — ONDANSETRON HCL 4 MG/2ML IJ SOLN: 4.0000 mg | Freq: Four times a day (QID) | INTRAMUSCULAR | Status: DC | PRN

## 2022-06-14 MED ORDER — MEPERIDINE HCL 25 MG/ML IJ SOLN: 6.2500 mg | INTRAMUSCULAR | Status: DC | PRN

## 2022-06-14 MED ORDER — ROCURONIUM BROMIDE 100 MG/10ML IV SOLN
INTRAVENOUS | Status: DC | PRN
  Administered 2022-06-14: 10 mg via INTRAVENOUS
  Administered 2022-06-14: 50 mg via INTRAVENOUS
  Administered 2022-06-14: 10 mg via INTRAVENOUS

## 2022-06-14 MED ORDER — LACTATED RINGERS IV SOLN: INTRAVENOUS | Status: DC

## 2022-06-14 MED ORDER — DEXMEDETOMIDINE HCL IN NACL 200 MCG/50ML IV SOLN
INTRAVENOUS | Status: DC | PRN
  Administered 2022-06-14 (×2): 4 ug via INTRAVENOUS
  Administered 2022-06-14: 8 ug via INTRAVENOUS
  Administered 2022-06-14: 4 ug via INTRAVENOUS

## 2022-06-14 MED ORDER — POVIDONE-IODINE 10 % EX SWAB: 2.0000 | Freq: Once | CUTANEOUS | Status: DC

## 2022-06-14 SURGICAL SUPPLY — 78 items
ADH SKN CLS APL DERMABOND .7 (GAUZE/BANDAGES/DRESSINGS) ×3
APL PRP STRL LF DISP 70% ISPRP (MISCELLANEOUS) ×3
BAG DRAIN URO-CYSTO SKYTR STRL (DRAIN) IMPLANT
BAG DRN UROCATH (DRAIN)
CATH FOLEY 2WAY SLVR  5CC 12FR (CATHETERS) ×3
CATH FOLEY 2WAY SLVR 5CC 12FR (CATHETERS) IMPLANT
CATH FOLEY 3WAY  5CC 16FR (CATHETERS) ×3
CATH FOLEY 3WAY 5CC 16FR (CATHETERS) ×3 IMPLANT
CATH ROBINSON RED A/P 12FR (CATHETERS) IMPLANT
CHLORAPREP W/TINT 26 (MISCELLANEOUS) ×3 IMPLANT
COVER BACK TABLE 60X90IN (DRAPES) ×3 IMPLANT
COVER TIP SHEARS 8 DVNC (MISCELLANEOUS) ×3 IMPLANT
COVER TIP SHEARS 8MM DA VINCI (MISCELLANEOUS) ×3
DEFOGGER SCOPE WARMER CLEARIFY (MISCELLANEOUS) ×3 IMPLANT
DERMABOND ADVANCED .7 DNX12 (GAUZE/BANDAGES/DRESSINGS) ×3 IMPLANT
DRAPE ARM DVNC X/XI (DISPOSABLE) ×12 IMPLANT
DRAPE COLUMN DVNC XI (DISPOSABLE) ×3 IMPLANT
DRAPE DA VINCI XI ARM (DISPOSABLE) ×12
DRAPE DA VINCI XI COLUMN (DISPOSABLE) ×3
DRAPE HYSTEROSCOPY (MISCELLANEOUS) ×3 IMPLANT
DRAPE SHEET LG 3/4 BI-LAMINATE (DRAPES) ×3 IMPLANT
DRAPE UTILITY XL STRL (DRAPES) ×3 IMPLANT
ELECT REM PT RETURN 9FT ADLT (ELECTROSURGICAL) ×3
ELECTRODE REM PT RTRN 9FT ADLT (ELECTROSURGICAL) ×3 IMPLANT
GAUZE 4X4 16PLY ~~LOC~~+RFID DBL (SPONGE) ×6 IMPLANT
GLOVE BIO SURGEON STRL SZ 6 (GLOVE) ×9 IMPLANT
GLOVE BIOGEL PI IND STRL 6.5 (GLOVE) ×12 IMPLANT
GLOVE BIOGEL PI IND STRL 7.0 (GLOVE) ×6 IMPLANT
GOWN STRL REUS W/TWL LRG LVL3 (GOWN DISPOSABLE) ×3 IMPLANT
HIBICLENS CHG 4% 4OZ BTL (MISCELLANEOUS) ×3 IMPLANT
HOLDER FOLEY CATH W/STRAP (MISCELLANEOUS) ×3 IMPLANT
IRRIG SUCT STRYKERFLOW 2 WTIP (MISCELLANEOUS) ×3
IRRIGATION SUCT STRKRFLW 2 WTP (MISCELLANEOUS) ×3 IMPLANT
KIT TURNOVER CYSTO (KITS) ×3 IMPLANT
LEGGING LITHOTOMY PAIR STRL (DRAPES) ×3 IMPLANT
MANIFOLD NEPTUNE II (INSTRUMENTS) ×3 IMPLANT
MANIPULATOR ADVINCU DEL 2.5 PL (MISCELLANEOUS) IMPLANT
MANIPULATOR ADVINCU DEL 3.0 PL (MISCELLANEOUS) IMPLANT
MANIPULATOR ADVINCU DEL 3.5 PL (MISCELLANEOUS) IMPLANT
MANIPULATOR ADVINCU DEL 4.0 PL (MISCELLANEOUS) IMPLANT
MESH VERTESSA LITE -Y 2X4X3 (Mesh General) ×3 IMPLANT
NDL INSUFFLATION 14GA 120MM (NEEDLE) ×3 IMPLANT
NEEDLE INSUFFLATION 14GA 120MM (NEEDLE) ×6 IMPLANT
OBTURATOR OPTICAL STANDARD 8MM (TROCAR) ×3
OBTURATOR OPTICAL STND 8 DVNC (TROCAR) ×3
OBTURATOR OPTICALSTD 8 DVNC (TROCAR) ×3 IMPLANT
PACK CYSTO (CUSTOM PROCEDURE TRAY) ×3 IMPLANT
PACK ROBOT WH (CUSTOM PROCEDURE TRAY) ×3 IMPLANT
PACK ROBOTIC GOWN (GOWN DISPOSABLE) ×3 IMPLANT
PAD OB MATERNITY 4.3X12.25 (PERSONAL CARE ITEMS) ×3 IMPLANT
PAD POSITIONING PINK XL (MISCELLANEOUS) ×3 IMPLANT
PAD PREP 24X48 CUFFED NSTRL (MISCELLANEOUS) ×3 IMPLANT
POUCH LAPAROSCOPIC INSTRUMENT (MISCELLANEOUS) IMPLANT
PROTECTOR NERVE ULNAR (MISCELLANEOUS) ×3 IMPLANT
SEAL CANN UNIV 5-8 DVNC XI (MISCELLANEOUS) ×15 IMPLANT
SEAL XI 5MM-8MM UNIVERSAL (MISCELLANEOUS) ×15
SEALER VESSEL DA VINCI XI (MISCELLANEOUS) ×3
SEALER VESSEL EXT DVNC XI (MISCELLANEOUS) IMPLANT
SET IRRIG Y TYPE TUR BLADDER L (SET/KITS/TRAYS/PACK) ×3 IMPLANT
SET TUBE SMOKE EVAC HIGH FLOW (TUBING) ×3 IMPLANT
SPIKE FLUID TRANSFER (MISCELLANEOUS) ×6 IMPLANT
SPONGE T-LAP 4X18 ~~LOC~~+RFID (SPONGE) IMPLANT
SUT ABS MONO DBL WITH NDL 48IN (SUTURE) IMPLANT
SUT GORETEX NAB #0 THX26 36IN (SUTURE) ×3 IMPLANT
SUT MNCRL AB 4-0 PS2 18 (SUTURE) ×3 IMPLANT
SUT MON AB 2-0 SH 27 (SUTURE) ×3 IMPLANT
SUT V-LOC BARB 180 2/0GR6 GS22 (SUTURE)
SUT V-LOC BARB 180 2/0GR9 GS23 (SUTURE) ×6
SUT VIC AB 0 CT1 27 (SUTURE) ×6
SUT VIC AB 0 CT1 27XBRD ANTBC (SUTURE) ×6 IMPLANT
SUT VLOC 180 0 9IN  GS21 (SUTURE)
SUT VLOC 180 0 9IN GS21 (SUTURE) IMPLANT
SUT VLOC 180 2-0 9IN GS21 (SUTURE) IMPLANT
SUTURE V-LC BRB 180 2/0GR6GS22 (SUTURE) IMPLANT
SUTURE V-LC BRB 180 2/0GR9GS23 (SUTURE) ×6 IMPLANT
SYSTEM URETHRAL BULK BULKAMID (Female Continence) IMPLANT
TOWEL OR 17X26 10 PK STRL BLUE (TOWEL DISPOSABLE) ×3 IMPLANT
TUBE CONNECTING 12X1/4 (SUCTIONS) IMPLANT

## 2022-06-14 NOTE — Discharge Instructions (Signed)

## 2022-06-14 NOTE — Anesthesia Preprocedure Evaluation (Signed)
Anesthesia Evaluation  Patient identified by MRN, date of birth, ID band Patient awake    Reviewed: Allergy & Precautions, NPO status , Patient's Chart, lab work & pertinent test results  Airway Mallampati: II       Dental  (+) Teeth Intact, Dental Advisory Given   Pulmonary neg pulmonary ROS   Pulmonary exam normal breath sounds clear to auscultation       Cardiovascular Normal cardiovascular exam     Neuro/Psych Seizures -, Well Controlled,  PSYCHIATRIC DISORDERS Anxiety Depression       GI/Hepatic negative GI ROS, Neg liver ROS,,,  Endo/Other  negative endocrine ROS  Obesity   Renal/GU negative Renal ROS   anterior prolapse, posterior prolapse, stress urinary incontinence    Musculoskeletal negative musculoskeletal ROS (+)    Abdominal   Peds  Hematology negative hematology ROS (+)   Anesthesia Other Findings the patient.  Reproductive/Obstetrics negative OB ROS                             Anesthesia Physical Anesthesia Plan  ASA: 2  Anesthesia Plan: General   Post-op Pain Management: Minimal or no pain anticipated   Induction: Intravenous  PONV Risk Score and Plan: 4 or greater and Scopolamine patch - Pre-op, Midazolam, Dexamethasone and Ondansetron  Airway Management Planned: Oral ETT  Additional Equipment: None  Intra-op Plan:   Post-operative Plan: Extubation in OR  Informed Consent: I have reviewed the patients History and Physical, chart, labs and discussed the procedure including the risks, benefits and alternatives for the proposed anesthesia with the patient or authorized representative who has indicated his/her understanding and acceptance.     Dental advisory given  Plan Discussed with: CRNA  Anesthesia Plan Comments:         Anesthesia Quick Evaluation

## 2022-06-14 NOTE — Transfer of Care (Signed)
Immediate Anesthesia Transfer of Care Note  Patient: Erin Dennis  Procedure(s) Performed: XI ROBOTIC ASSISTED TOTAL HYSTERECTOMY, SACROCOLPOPEXY (Pelvis) URETHRAL BULKING (possible) (Bladder) CYSTOSCOPY (Bladder) INTRAUTERINE DEVICE (IUD) REMOVAL (Cervix)  Patient Location: PACU  Anesthesia Type:General  Level of Consciousness: awake, alert , oriented, and patient cooperative  Airway & Oxygen Therapy: Patient Spontanous Breathing and Patient connected to nasal cannula oxygen  Post-op Assessment: Report given to RN and Post -op Vital signs reviewed and stable  Post vital signs: Reviewed and stable  Last Vitals:  Vitals Value Taken Time  BP 118/74 06/14/22 1415  Temp    Pulse 69 06/14/22 1417  Resp 12 06/14/22 1417  SpO2 95 % 06/14/22 1417  Vitals shown include unvalidated device data.  Last Pain:  Vitals:   06/14/22 0919  TempSrc: Oral  PainSc: 4       Patients Stated Pain Goal: 4 (47/09/29 5747)  Complications: No notable events documented.

## 2022-06-14 NOTE — Interval H&P Note (Signed)
History and Physical Interval Note:  06/14/2022 10:10 AM  Erin Dennis  has presented today for surgery, with the diagnosis of anterior vaginal prolapse; uterovaginal prolpase incomplete; stress urinary incontinence.  The various methods of treatment have been discussed with the patient and family. After consideration of risks, benefits and other options for treatment, the patient has consented to  Procedure(s): XI ROBOTIC ASSISTED TOTAL HYSTERECTOMY WITH BILATERAL SALPINGECTOMY AND SACROCOLPOPEXY (N/A) URETHRAL BULKING (possible) (N/A) CYSTOSCOPY (N/A) as a surgical intervention.  The patient's history has been reviewed, patient examined, no change in status, stable for surgery.  I have reviewed the patient's chart and labs.  Questions were answered to the patient's satisfaction.     [name]

## 2022-06-14 NOTE — Anesthesia Procedure Notes (Signed)
Procedure Name: Intubation Date/Time: 06/14/2022 11:11 AM  Performed by: Georgeanne Nim, CRNAPre-anesthesia Checklist: Patient identified, Emergency Drugs available, Suction available, Patient being monitored and Timeout performed Patient Re-evaluated:Patient Re-evaluated prior to induction Oxygen Delivery Method: Circle system utilized Preoxygenation: Pre-oxygenation with 100% oxygen Induction Type: IV induction Ventilation: Mask ventilation without difficulty Laryngoscope Size: Mac and 3 Grade View: Grade I Tube type: Oral Tube size: 7.0 mm Number of attempts: 1 Airway Equipment and Method: Stylet Placement Confirmation: ETT inserted through vocal cords under direct vision, positive ETCO2, CO2 detector and breath sounds checked- equal and bilateral Secured at: 20 cm Tube secured with: Tape Dental Injury: Teeth and Oropharynx as per pre-operative assessment

## 2022-06-14 NOTE — Op Note (Addendum)
Operative Note  Preoperative Diagnosis: anterior vaginal prolapse, uterovaginal prolapse, incomplete, and stress urinary incontinence  Postoperative Diagnosis: same  Procedures performed:  Robotic assisted total laparoscopic hysterectomy, sacrocolpopexy Erenest Blank Lite Y mesh), cystoscopy, urethral bulking (Bulkamid)  Implants:  Implant Name Type Inv. Item Serial No. Manufacturer Lot No. LRB No. Used Action  MESH Valli Glance [id number] Mesh General MESH [place] [id number] N/A 1 Implanted  SYSTEM Hinda Kehr [id number] Female Continence Jerauld [id number] N/A 1 Implanted    Attending Surgeon: Sherlene Shams, MD  Assistant Surgeon: Lyman Speller, MD  Anesthesia: General endotracheal  Findings: 1. On vaginal exam, stage II prolapse noted  2. On laparoscopy, normal appearing uterus, bilateral ovaries.   3. On cystoscopy, normal bladder and urethra without injury, lesion or foreign body. Brisk bilateral ureteral efflux noted. After Bulkamid injection, good urethral coaptation was noted.    Specimens:  ID Type Source Tests Collected by Time Destination  1 : UTERUS WITH CERVIX, IUD Tissue PATH Gyn benign resection SURGICAL PATHOLOGY [name], MD 06/14/2022 1207     Estimated blood loss: 50 mL  IV fluids: 1000 mL  Urine output: 387 mL  Complications: none  Procedure in Detail: After informed consent was obtained, the patient was taken to the operating room, where general anesthesia was induced and found to be adequate. She was placed in dorsolithotomy position in yellowfin stirrups. Her hips were noted not to be hyperflexed or hyperextended. Her arms were padded with gel pads and tucked to her sides. Her hands were surrounded by foam. A padded strap was placed across her chest with foam between the pad and her skin. She was noted to be appropriately positioned with all pressure  points well padded and off tension. A tilt test showed no slippage. She was prepped and draped in the usual sterile fashion. A uterine manipulator was placed in the uterus after sounding to 7 cm, an appropriately sized Koh ring was placed around the cervix, and a pneumo-occluder balloon was positioned in the vagina for later use.  A sterile Foley catheter was inserted.   0.25% plain Marcaine was injected in the supraumbilical  area and an 8 mm supraumbilical skin incision was made with the scalpel.  A Veress needle was inserted into the incision, CO2 insufflation was started, a low opening pressure was noted, and pneumoperitoneum was obtained. The Veress needle was removed and a 51mm robotic trocar was placed. The robotic camera was inserted and intraperitoneal placement was confirmed. Survey of the abdomen and pelvis revealed the findings as noted above. The sacrum appeared to be free of any adhesive disease. After determining placement for the other ports, Local anesthetic was injected at each site and two 8 mm incisions were made for robotic ports at 10 cm lateral to and at the level of the umbilical port. Two additional 8 mm incisions were made 10 cm lateral to these and 30 degrees down followed by 8 mm robotic ports - the right side for an assistant port. All trocars were placed sequentially under direct visualization of the camera. The patient was placed in Trendelenburg. The robot was docked on the patient's left side. Monopolar endoshears alternating with a vessel sealer were placed in the right arm, a [place] bipolar grasper was placed in the 2nd arm of the patient's left side, and a Tip up grasper was placed in the 3rd arm on the patient's left side.  Attention was then turned to the robotic hysterectomy. The ureter was identified and was found to be well away from the planned site of incision. The right uteroovarian ligament was cauterized and cut. The right round ligament was grasped, cauterized,  and transected with electrocautery. The anterior and posterior leaves of the broad ligament were taken down with cautery and sharp dissection. The uterine artery was skeletonized and the bladder flap was created on the right side with a combination of electrosurgery and sharp dissection. The KOH ring was identified. The right uterine artery was clamped, cauterized, and transected. In a similar fashion, the left side was taken down. Further sharp dissection with combination of cautery was performed to further develop the bladder flap. At this point, the KOH ring was completely hugging the cervix. The pneumo-occluder balloon in the vagina was inflated to maintain pneumoperitoneum. A colpotomy was performed with electrosurgical cutting current and the uterus and cervix were completely amputated from the vagina. The specimen was delivered through the vagina. The posterior portion of the vaginal cuff was then grasped and pulled up to maintain pneumoperitoneum. The pneumo-occluder balloon was then replaced in the vagina. The right hand instrument was changed to a suture-cut needle driver. The vaginal cuff was then closed using a 0 V-lock suture in two layers.    The right hand instrument was replaced with monopolar endoshears. With a lucite probe in the vagina, the anterior vaginal dissection was then performed with sharp dissection and electrosurgery. The posterior vaginal dissection was then performed with sharp dissection and electrosurgery in order to dissect the rectum away from the posterior vagina. Attention was then turned to the sacral promontory. The peritoneum overlying the sacral promontory was tented up, dissected sharply with monopolar scissors and electrosurgery using layer by layer technique. The peritoneal incision was extended down to the posterior cul-de-sac. This was performed with care to avoid the ureter on the right side and the sigmoid colon and its mesentary on the left side.  A "Y" mesh was  then inserted into the abdomen after trimming to appropriate size. With the probe in the vagina, the anterior leaf of the Y mesh was affixed to the anterior portion of the vagina using a 2-0 v-loc suture in a spiral pattern to distribute the suture evenly across the surface of the anterior mesh leaf. In a similar fashion, the posterior leaf of the Y mesh was attached to the posterior surface of the vagina with 2-0 v-loc suture.  The distal end of the mesh was then brought to overlie the sacrum. The correct amount of tension was determined in order to elevate the vagina, but not put the mesh under tension. The distal end of the mesh was then affixed to the anterior longitudinal sacral ligament using two interrupted transverse stitches of CV2 Gortex. The excess distal mesh was then cut and removed. The peritoneum was reapproximated over the mesh using 2-0 monocryl. The bladder flap was incorporated to completely retroperitonealize the mesh. All pedicles were carefully inspected and noted to be hemostatic as the CO2 gas was deflated. All instruments were removed from the patient's abdomen.   The Foley catheter was removed.  A 70-degree cystoscope was introduced, and 360-degree inspection revealed no injury, lesion or foreign body in the bladder. Brisk bilateral ureteral efflux was noted with the assistance of pyridium.  The bladder was drained and the cystoscope was removed.  The robot was undocked. The CO2 gas was removed and the ports were removed.  The skin incisions were  closed with subcutaneous stitches of 4-0 Monocryl and covered with skin glue.    A 0 degree Bulkamid cystoscope was inserted into the urethra into the bladder.  The needle was primed.  The cystoscope was inserted to the level of the bladder neck.  The needle was inserted 2 cm and the scope was pulled back into the urethra 2 cm.  The needle was inserted bevel up at the 5 o'clock position and the Bulkamid was injected to obtain coaptation.  This  was repeated at the 2 o'clock,  10 o'clock and 7 o'clock positions.   A total of 2 34ml syringes were used. and good circumferential coaptation was noted. The cystoscope was removed and a 32F catheter was placed. Sponge, lap, and needle counts were correct x 2. The patient tolerated the procedure well. She was awakened from anesthesia and transferred to the recovery room in stable condition.   Dr Ammie Ferrier assistance was required due to the complexity of the case and no other skilled assistance was available.   [name], MD

## 2022-06-15 ENCOUNTER — Ambulatory Visit (INDEPENDENT_AMBULATORY_CARE_PROVIDER_SITE_OTHER): Payer: Medicaid Other

## 2022-06-15 ENCOUNTER — Encounter (HOSPITAL_BASED_OUTPATIENT_CLINIC_OR_DEPARTMENT_OTHER): Payer: Self-pay | Admitting: Obstetrics and Gynecology

## 2022-06-15 ENCOUNTER — Telehealth: Payer: Self-pay | Admitting: Obstetrics and Gynecology

## 2022-06-15 DIAGNOSIS — R339 Retention of urine, unspecified: Secondary | ICD-10-CM

## 2022-06-15 LAB — SURGICAL PATHOLOGY

## 2022-06-15 NOTE — Patient Instructions (Signed)
Keep all future post op

## 2022-06-15 NOTE — Anesthesia Postprocedure Evaluation (Signed)
Anesthesia Post Note  Patient: Environmental health practitioner  Procedure(s) Performed: XI ROBOTIC ASSISTED TOTAL HYSTERECTOMY, SACROCOLPOPEXY (Pelvis) URETHRAL BULKING (possible) (Bladder) CYSTOSCOPY (Bladder) INTRAUTERINE DEVICE (IUD) REMOVAL (Cervix)     Anesthesia Type: General Anesthetic complications: no   No notable events documented.  Last Vitals:  Vitals:   06/14/22 1630 06/14/22 1715  BP: 128/79 111/76  Pulse: 66 74  Resp: 14 16  Temp: (!) 36.4 C 36.4 C  SpO2: 95% 96%    Last Pain:  Vitals:   06/15/22 0952  TempSrc:   PainSc: 6    Pain Goal: Patients Stated Pain Goal: 4 (06/14/22 1545)                 Huston Foley

## 2022-06-15 NOTE — Telephone Encounter (Signed)
Erin Dennis underwent Robotic assisted total laparoscopic hysterectomy, sacrocolpopexy, cystoscopy, urethral bulking (Bulkamid)  on 06/14/22.   She passed her voiding trial.  3105ml was backfilled into the bladder Voided 236ml  PVR by bladder scan was 52ml.   She was discharged without a catheter. Please call her for a routine post op check. Thanks!  [name], MD

## 2022-06-15 NOTE — Telephone Encounter (Signed)
Spoke to the pt and she said she is having difficulty urinating. Pt said she only drips a little and does not completely empty. I called Dr Wannetta Sender and she said to have the patient come in get a PVR and place a catheter. Pt said she would try to get a ride. I explained to the patient it was important to come in. Pt said ok

## 2022-06-15 NOTE — Anesthesia Postprocedure Evaluation (Addendum)
Anesthesia Post Note  Patient: Erin Dennis  Procedure(s) Performed: XI ROBOTIC ASSISTED TOTAL HYSTERECTOMY, SACROCOLPOPEXY (Pelvis) URETHRAL BULKING (possible) (Bladder) CYSTOSCOPY (Bladder) INTRAUTERINE DEVICE (IUD) REMOVAL (Cervix)     Patient location during evaluation: PACU Anesthesia Type: General Level of consciousness: awake Pain management: pain level controlled Vital Signs Assessment: post-procedure vital signs reviewed and stable Respiratory status: spontaneous breathing Cardiovascular status: stable Postop Assessment: no apparent nausea or vomiting Anesthetic complications: no  No notable events documented.  Last Vitals:  Vitals:   06/14/22 1630 06/14/22 1715  BP: 128/79 111/76  Pulse: 66 74  Resp: 14 16  Temp: (!) 36.4 C 36.4 C  SpO2: 95% 96%    Last Pain:  Vitals:   06/15/22 0952  TempSrc:   PainSc: 6    Pain Goal: Patients Stated Pain Goal: 4 (06/14/22 1545)                 Huston Foley

## 2022-06-15 NOTE — Progress Notes (Signed)
Erin Dennis is a 41 y.o. female here because she said she did not feel like she was emptying. Pt said she has surgery yesterday and passed her voiding trial at that time. Pt was very upset about possible having a catheter placed. Pt attempted to use the restroom before getting her bladder scanned.  PVR was 600 ml. A 76fr catheter was placed into the bladder an drained of 600 mL. Pt was scheduled for a nurse visit on 11/20 @ 9am for a voiding trial.

## 2022-06-16 ENCOUNTER — Encounter: Payer: 59 | Admitting: Podiatry

## 2022-06-17 ENCOUNTER — Ambulatory Visit: Payer: Medicaid Other

## 2022-06-17 ENCOUNTER — Encounter: Payer: Self-pay | Admitting: Obstetrics and Gynecology

## 2022-06-17 ENCOUNTER — Ambulatory Visit (INDEPENDENT_AMBULATORY_CARE_PROVIDER_SITE_OTHER): Payer: Medicaid Other | Admitting: Obstetrics and Gynecology

## 2022-06-17 VITALS — BP 171/91 | HR 64

## 2022-06-17 DIAGNOSIS — Z9889 Other specified postprocedural states: Secondary | ICD-10-CM

## 2022-06-17 NOTE — Progress Notes (Signed)
Urogyn Nurse Voiding Trial Note  Erin Dennis underwent XI robotic assisted total assisted total hysterectomy sacrocolpopexy with urethral bulking on 06/14/2022.  She presents today for a voiding trial .   Patient was identified with 2 indicators. 170 ml of NS was instilled into the bladder via the catheter. Attempted to do more but patient requested to try to urinate. The catheter was removed and patient was instructed to void into the urinary hat. She voided 169ml. The post void residual measured by bladder scan was 29ml. She passed the voiding trial and a catheter was not replaced. She states she feels "10 times better now".    Patient stated during voiding trial that she had "cheated at the hospital". She reports she had her husband bring her warm water and poured it into the hat. She reports she was not bladder scanned at the hospital at her voiding trial.   Endorsed concerns regarding discharge into her underwear. Explained this is related to the surgical healing and stitches in the vaginal region and will continue for a while.   The patient received aftercare instructions and will follow up as scheduled.

## 2022-06-20 ENCOUNTER — Ambulatory Visit: Payer: Medicaid Other

## 2022-06-20 ENCOUNTER — Ambulatory Visit: Payer: Medicaid Other | Admitting: Obstetrics and Gynecology

## 2022-06-21 ENCOUNTER — Encounter: Payer: Self-pay | Admitting: *Deleted

## 2022-06-28 ENCOUNTER — Ambulatory Visit (INDEPENDENT_AMBULATORY_CARE_PROVIDER_SITE_OTHER): Payer: Medicaid Other

## 2022-06-28 ENCOUNTER — Ambulatory Visit (INDEPENDENT_AMBULATORY_CARE_PROVIDER_SITE_OTHER): Payer: Medicaid Other | Admitting: Podiatry

## 2022-06-28 DIAGNOSIS — M21611 Bunion of right foot: Secondary | ICD-10-CM

## 2022-06-28 DIAGNOSIS — M2011 Hallux valgus (acquired), right foot: Secondary | ICD-10-CM | POA: Diagnosis not present

## 2022-06-28 DIAGNOSIS — M2012 Hallux valgus (acquired), left foot: Secondary | ICD-10-CM | POA: Diagnosis not present

## 2022-06-29 NOTE — Progress Notes (Addendum)
Subjective:  Patient ID: Erin Dennis, female    DOB: 04/26/1981,  MRN: [id number]  Chief Complaint  Patient presents with   Bunions    POV #4 DOS 05/06/2022 LAPIDUS BUNIONECTOMY, BONE GRAFT FROM HEEL, BONE CUT IN BIG TOE LEFT    41 y.o. female returns for post-op check.  She returns for follow-up today she is wearing regular shoes.  She still has some sensitivity over the scar and near the ankle but has improved.  She would like to have her right foot surgery scheduled it has  been painful and she has not had relief.  Review of Systems: Negative except as noted in the HPI. Denies N/V/F/Ch.   Objective:  There were no vitals filed for this visit. There is no height or weight on file to calculate BMI. Constitutional Well developed. Well nourished.  Vascular Foot warm and well perfused. Capillary refill normal to all digits.  Calf is soft and supple, no posterior calf or knee pain, negative Homans' sign  Neurologic Normal speech. Oriented to person, place, and time. Epicritic sensation to light touch grossly present bilaterally.  No signs of CRPS type I or II  Dermatologic Incision is well-healed nonhypertrophic  Orthopedic: Edema has improved but still present.  No pain or tenderness around arthrodesis site.  On the right foot she has significant hallux valgus deformity with a large dorsal medial eminence and bunion   Multiple view plain film radiographs: Radiographs taken last week showed no loss of correction currently  Previous right foot radiographs show a large hallux valgus deformity with frontal plane rotation and deviation of the sesamoids with a hallux abductus angle increased and intermetatarsal angle increase, increase in PASA Assessment:   1. Acquired hallux interphalangeus, left   2. Hallux valgus with bunions, left   3. Hallux valgus with bunions, right   4. Acquired hallux interphalangeus, right     Plan:  Patient was evaluated and treated and all questions  answered.  S/p foot surgery left -I reviewed her left radiographs today with her.  She does show good bone healing and I think she may continue in regular shoe gear at this point.  Advised to avoid impact activity still for now.  Hallux valgus right, hallux interphalangeus right Reviewed her previous right foot x-rays and my clinical exam findings of her hallux valgus on the right side again.  She desires surgical intervention. She has exhausted all non operative treatment for the deformity including anti inflammatories, activity modification and changes in shoes including wider shoes and the bunion is still painful. We discussed all risks including but not limited to: pain, swelling, infection, scar, numbness which may be temporary or permanent, chronic pain, stiffness, nerve pain or damage, wound healing problems, bone healing problems including delayed or non-union and recurrence. Specifically we discussed the following procedures: Right foot Lapidus bunionectomy, possible Akin and possible Reverdin osteotomy to correct the deformity. Informed consent was signed today. Surgery will be scheduled at a mutually agreeable date. Information regarding this will be forwarded to our surgery scheduler.   Surgical plan:  Procedure: -Right foot Lapidus bunionectomy with bone graft from heel, possible Akin osteotomy and possible Reverdin and osteotomy  Location: -[facility]  Anesthesia plan: -IV sedation with local  Postoperative pain plan: - Tylenol 1000 mg every 6 hours, ibuprofen 600 mg every 8 hours, gabapentin 300 mg every 8 hours x5 days, oxycodone 5 mg 1-2 tabs every 6 hours only as needed  DVT prophylaxis: -ASA 325 mg twice daily  WB Restrictions / DME needs: -NWB in splint postop  No follow-ups on file.

## 2022-08-02 ENCOUNTER — Ambulatory Visit (INDEPENDENT_AMBULATORY_CARE_PROVIDER_SITE_OTHER): Payer: 59 | Admitting: Obstetrics and Gynecology

## 2022-08-02 ENCOUNTER — Encounter: Payer: Self-pay | Admitting: Obstetrics and Gynecology

## 2022-08-02 VITALS — BP 131/86 | HR 76

## 2022-08-02 DIAGNOSIS — Z9889 Other specified postprocedural states: Secondary | ICD-10-CM

## 2022-08-02 NOTE — Progress Notes (Signed)
[facility]  Date of Visit: 08/02/2022  History of Present Illness: Ms. Coover is a 42 y.o. female scheduled today for a post-operative visit.   Surgery: s/p Robotic assisted total laparoscopic hysterectomy, sacrocolpopexy Erenest Blank Lite Y mesh), cystoscopy, urethral bulking (Bulkamid) on 06/14/22  She passed her postoperative void trial but then returned to the office 11/15 because she was unable to void and had 40F catheter placed. Returned to office on 11/17 and passed VT  Postoperative course has been uncomplicated.   Today she reports she is feeling well.   UTI in the last 6 weeks? No  Pain? No  She has returned to her normal activity (except for postop restrictions) Vaginal bulge? No  Stress incontinence: No  Urgency/frequency: No  Urge incontinence: No  Voiding dysfunction: No  Bowel issues: No   Subjective Success: Do you usually have a bulge or something falling out that you can see or feel in the vaginal area? No  Retreatment Success: Any retreatment with surgery or pessary for any compartment? No   Pathology results: UTERUS WITH IUD, HYSTERECTOMY:  Chronic endometritis  Acute and chronic cervicitis  Mirena type IUD (gross only)   Medications: She has a current medication list which includes the following prescription(s): acitretin, lidocaine, and silver sulfadiazine.   Allergies: Patient is allergic to acetaminophen, 5-alpha reductase inhibitors, sulfamethoxazole-trimethoprim, and tape.   Physical Exam: BP 131/86   Pulse 76   Breastfeeding No   Abdomen: soft, non-tender, without masses or organomegaly Laparoscopic Incisions: healing well.  Pelvic Examination: Vagina: Incisions healing well. Sutures are present at the apex and there is not granulation tissue. No tenderness along the anterior or posterior vagina. No apical tenderness. No pelvic masses. No visible or palpable mesh.  POP-Q: POP-Q  -3                                            Aa    -3                                           Ba  -9                                              C   3.5                                            Gh  3.5                                            Pb  9.5                                            tvl   -2  Ap  -2                                            Bp                                                 D     ---------------------------------------------------------  Assessment and Plan:  1. Post-operative state     - Pathology results were reviewed with the patient today and she verbalized understanding that the results were benign.  - Can resume regular activity including exercise. Discussed avoidance of heavy lifting and straining long term to reduce the risk of recurrence. Wait an additional 6 weeks prior to intercourse.  - We discussed that urethral bulking is not a permanent procedure so will likely need re-treatment for SUI in the future (can last several years). Not currently having symptoms.   Return as needed  [name], MD

## 2022-08-16 ENCOUNTER — Telehealth: Payer: Self-pay | Admitting: Podiatry

## 2022-08-16 NOTE — Telephone Encounter (Addendum)
DOS: 09/14/2022  [place] Medicaid [employer] Effective 01/29/2022  [facility] [facility] [employer] Bunionectomy Rt [phone]) Aiken Osteotomy Rt [phone]) Base Wedge Osteotomy Rt [phone]) Bone Graft Rt ([id number])  Authorization #: [id number]

## 2022-09-05 ENCOUNTER — Encounter
Admission: RE | Admit: 2022-09-05 | Discharge: 2022-09-05 | Disposition: A | Discharge status: Home / Self Care | Payer: Medicaid Other | Source: Ambulatory Visit | Attending: Podiatry | Admitting: Podiatry

## 2022-09-05 ENCOUNTER — Encounter: Payer: Self-pay | Admitting: *Deleted

## 2022-09-05 NOTE — Patient Instructions (Addendum)
Your procedure is scheduled on: Wednesday, February 14 Report to the Registration Desk on the 1st floor of the [facility]. To find out your arrival time, please call [phone] between 1PM - 3PM on: Tuesday, February 13 If your arrival time is 6:00 am, do not arrive prior to that time as the [facility] entrance doors do not open until 6:00 am.  REMEMBER: Instructions that are not followed completely may result in serious medical risk, up to and including death; or upon the discretion of your surgeon and anesthesiologist your surgery may need to be rescheduled.  Do not eat food after midnight the night before surgery.  No gum chewing, lozengers or hard candies.  You may however, drink CLEAR liquids up to 2 hours before you are scheduled to arrive for your surgery. Do not drink anything within 2 hours of your scheduled arrival time.  Clear liquids include: - water  - apple juice without pulp - gatorade (not RED colors) - black coffee or tea (Do NOT add milk or creamers to the coffee or tea) Do NOT drink anything that is not on this list.  DO NOT TAKE ANY MEDICATIONS THE MORNING OF SURGERY   One week prior to surgery: starting February 7 Stop Anti-inflammatories (NSAIDS) such as Advil, Aleve, Ibuprofen, Motrin, Naproxen, Naprosyn and Aspirin based products such as Excedrin, Goodys Powder, BC Powder. Stop ANY OVER THE COUNTER supplements until after surgery. You may however, continue to take Tylenol if needed for pain up until the day of surgery.  No Alcohol for 24 hours before or after surgery.  No Smoking including e-cigarettes for 24 hours prior to surgery.  No chewable tobacco products for at least 6 hours prior to surgery.  No nicotine patches on the day of surgery.  Do not use any "recreational" drugs for at least a week prior to your surgery.  Please be advised that the combination of cocaine and anesthesia may have negative outcomes, up to and including death. If you  test positive for cocaine, your surgery will be cancelled.  On the morning of surgery brush your teeth with toothpaste and water, you may rinse your mouth with mouthwash if you wish. Do not swallow any toothpaste or mouthwash.  Use CHG Soap as directed on instruction sheet.  Do not wear jewelry, make-up, hairpins, clips or nail polish.  Do not wear lotions, powders, or perfumes.   Do not shave body from the neck down 48 hours prior to surgery just in case you cut yourself which could leave a site for infection.  Also, freshly shaved skin may become irritated if using the CHG soap.  Contact lenses, hearing aids and dentures may not be worn into surgery.  Do not bring valuables to the hospital. [facility] is not responsible for any missing/lost belongings or valuables.   Notify your doctor if there is any change in your medical condition (cold, fever, infection).  Wear comfortable clothing (specific to your surgery type) to the hospital.  After surgery, you can help prevent lung complications by doing breathing exercises.  Take deep breaths and cough every 1-2 hours. Your doctor may order a device called an Incentive Spirometer to help you take deep breaths.  If you are being discharged the day of surgery, you will not be allowed to drive home. You will need a responsible individual to drive you home and stay with you for 24 hours after surgery.   If you are taking public transportation, you will need to  have a responsible adult (18 years or older) with you. Please confirm with your physician that it is acceptable to use public transportation.   Please call the [place] [facility] at [phone] if you have any questions about these instructions.  Surgery Visitation Policy:  Patients undergoing a surgery or procedure may have two family members or support persons with them as long as the person is not COVID-19 positive or experiencing its symptoms.      Preparing  for Surgery with CHLORHEXIDINE GLUCONATE (CHG) Soap  Chlorhexidine Gluconate (CHG) Soap  o An antiseptic cleaner that kills germs and bonds with the skin to continue killing germs even after washing  o Used for showering the night before surgery and morning of surgery  Before surgery, you can play an important role by reducing the number of germs on your skin.  CHG (Chlorhexidine gluconate) soap is an antiseptic cleanser which kills germs and bonds with the skin to continue killing germs even after washing.  Please do not use if you have an allergy to CHG or antibacterial soaps. If your skin becomes reddened/irritated stop using the CHG.  1. Shower the NIGHT BEFORE SURGERY and the MORNING OF SURGERY with CHG soap.  2. If you choose to wash your hair, wash your hair first as usual with your normal shampoo.  3. After shampooing, rinse your hair and body thoroughly to remove the shampoo.  4. Use CHG as you would any other liquid soap. You can apply CHG directly to the skin and wash gently with a scrungie or a clean washcloth.  5. Apply the CHG soap to your body only from the neck down. Do not use on open wounds or open sores. Avoid contact with your eyes, ears, mouth, and genitals (private parts). Wash face and genitals (private parts) with your normal soap.  6. Wash thoroughly, paying special attention to the area where your surgery will be performed.  7. Thoroughly rinse your body with warm water.  8. Do not shower/wash with your normal soap after using and rinsing off the CHG soap.  9. Pat yourself dry with a clean towel.  10. Wear clean pajamas to bed the night before surgery.  12. Place clean sheets on your bed the night of your first shower and do not sleep with pets.  13. Shower again with the CHG soap on the day of surgery prior to arriving at the hospital.  14. Do not apply any deodorants/lotions/powders.  15. Please wear clean clothes to the hospital.

## 2022-09-13 MED ORDER — LACTATED RINGERS IV SOLN: INTRAVENOUS | Status: DC

## 2022-09-13 MED ORDER — ORAL CARE MOUTH RINSE: 15.0000 mL | Freq: Once | OROMUCOSAL | Status: AC

## 2022-09-13 MED ORDER — FAMOTIDINE 20 MG PO TABS: 20.0000 mg | ORAL_TABLET | Freq: Once | ORAL | Status: AC

## 2022-09-13 MED ORDER — CHLORHEXIDINE GLUCONATE 0.12 % MT SOLN: 15.0000 mL | Freq: Once | OROMUCOSAL | Status: AC

## 2022-09-13 MED ORDER — CEFAZOLIN SODIUM-DEXTROSE 2-4 GM/100ML-% IV SOLN
2.0000 g | INTRAVENOUS | Status: AC
  Administered 2022-09-14: 2 g via INTRAVENOUS

## 2022-09-14 ENCOUNTER — Ambulatory Visit
Admission: RE | Admit: 2022-09-14 | Discharge: 2022-09-14 | Disposition: A | Discharge status: Home / Self Care | Payer: Medicaid Other | Attending: Podiatry | Admitting: Podiatry

## 2022-09-14 ENCOUNTER — Encounter: Payer: Self-pay | Admitting: Podiatry

## 2022-09-14 ENCOUNTER — Other Ambulatory Visit: Payer: Self-pay

## 2022-09-14 ENCOUNTER — Encounter
Admission: RE | Disposition: A | Discharge status: Home / Self Care | Payer: Self-pay | Source: Home / Self Care | Attending: Podiatry

## 2022-09-14 ENCOUNTER — Ambulatory Visit: Payer: Medicaid Other | Admitting: Anesthesiology

## 2022-09-14 ENCOUNTER — Ambulatory Visit: Payer: Medicaid Other

## 2022-09-14 ENCOUNTER — Ambulatory Visit: Payer: Medicaid Other | Admitting: Urgent Care

## 2022-09-14 DIAGNOSIS — M8970 Major osseous defect, unspecified site: Secondary | ICD-10-CM

## 2022-09-14 DIAGNOSIS — F419 Anxiety disorder, unspecified: Secondary | ICD-10-CM | POA: Insufficient documentation

## 2022-09-14 DIAGNOSIS — Q66211 Congenital metatarsus primus varus, right foot: Secondary | ICD-10-CM

## 2022-09-14 DIAGNOSIS — Q6681 Congenital vertical talus deformity, right foot: Secondary | ICD-10-CM | POA: Diagnosis not present

## 2022-09-14 DIAGNOSIS — M216X1 Other acquired deformities of right foot: Secondary | ICD-10-CM

## 2022-09-14 DIAGNOSIS — M2011 Hallux valgus (acquired), right foot: Secondary | ICD-10-CM | POA: Diagnosis present

## 2022-09-14 DIAGNOSIS — F32A Depression, unspecified: Secondary | ICD-10-CM | POA: Diagnosis not present

## 2022-09-14 DIAGNOSIS — M21171 Varus deformity, not elsewhere classified, right ankle: Secondary | ICD-10-CM | POA: Insufficient documentation

## 2022-09-14 HISTORY — PX: HALLUX VALGUS LAPIDUS: SHX6626

## 2022-09-14 HISTORY — PX: METATARSAL OSTEOTOMY: SHX1641

## 2022-09-14 SURGERY — BUNIONECTOMY, LAPIDUS
Anesthesia: General | Site: Toe | Laterality: Right

## 2022-09-14 MED ORDER — BUPIVACAINE LIPOSOME 1.3 % IJ SUSP
INTRAMUSCULAR | Status: DC | PRN
  Administered 2022-09-14: 20 mL

## 2022-09-14 MED ORDER — LACTATED RINGERS IV SOLN: INTRAVENOUS | Status: DC

## 2022-09-14 MED ORDER — LIDOCAINE HCL (CARDIAC) PF 100 MG/5ML IV SOSY
PREFILLED_SYRINGE | INTRAVENOUS | Status: DC | PRN
  Administered 2022-09-14: 70 mg via INTRAVENOUS

## 2022-09-14 MED ORDER — ONDANSETRON HCL 4 MG/2ML IJ SOLN
INTRAMUSCULAR | Status: AC
  Filled 2022-09-14: qty 2

## 2022-09-14 MED ORDER — CHLORHEXIDINE GLUCONATE 0.12 % MT SOLN
OROMUCOSAL | Status: AC
  Administered 2022-09-14: 15 mL via OROMUCOSAL
  Filled 2022-09-14: qty 15

## 2022-09-14 MED ORDER — IBUPROFEN 600 MG PO TABS: 600.0000 mg | ORAL_TABLET | Freq: Four times a day (QID) | ORAL | 0 refills | Status: AC | PRN

## 2022-09-14 MED ORDER — OXYCODONE HCL 5 MG PO TABS: 5.0000 mg | ORAL_TABLET | Freq: Once | ORAL | Status: DC | PRN

## 2022-09-14 MED ORDER — GLYCOPYRROLATE 0.2 MG/ML IJ SOLN
INTRAMUSCULAR | Status: DC | PRN
  Administered 2022-09-14: .2 mg via INTRAVENOUS

## 2022-09-14 MED ORDER — LIDOCAINE-EPINEPHRINE 2 %-1:100000 IJ SOLN
INTRAMUSCULAR | Status: AC
  Filled 2022-09-14: qty 1

## 2022-09-14 MED ORDER — DEXMEDETOMIDINE HCL IN NACL 80 MCG/20ML IV SOLN
INTRAVENOUS | Status: AC
  Filled 2022-09-14: qty 20

## 2022-09-14 MED ORDER — LIDOCAINE HCL (PF) 2 % IJ SOLN
INTRAMUSCULAR | Status: AC
  Filled 2022-09-14: qty 5

## 2022-09-14 MED ORDER — DEXMEDETOMIDINE HCL IN NACL 200 MCG/50ML IV SOLN
INTRAVENOUS | Status: DC | PRN
  Administered 2022-09-14: 4 ug via INTRAVENOUS
  Administered 2022-09-14 (×2): 8 ug via INTRAVENOUS

## 2022-09-14 MED ORDER — ONDANSETRON HCL 4 MG PO TABS: 4.0000 mg | ORAL_TABLET | Freq: Three times a day (TID) | ORAL | 0 refills | Status: DC | PRN

## 2022-09-14 MED ORDER — GABAPENTIN 300 MG PO CAPS: 300.0000 mg | ORAL_CAPSULE | Freq: Three times a day (TID) | ORAL | 0 refills | Status: DC

## 2022-09-14 MED ORDER — OXYCODONE HCL 5 MG/5ML PO SOLN: 5.0000 mg | Freq: Once | ORAL | Status: DC | PRN

## 2022-09-14 MED ORDER — ONDANSETRON HCL 4 MG/2ML IJ SOLN: 4.0000 mg | Freq: Once | INTRAMUSCULAR | Status: DC | PRN

## 2022-09-14 MED ORDER — MIDAZOLAM HCL 2 MG/2ML IJ SOLN
INTRAMUSCULAR | Status: AC
  Filled 2022-09-14: qty 2

## 2022-09-14 MED ORDER — OXYCODONE HCL 5 MG PO TABS: 5.0000 mg | ORAL_TABLET | ORAL | 0 refills | Status: AC | PRN

## 2022-09-14 MED ORDER — CEFAZOLIN SODIUM-DEXTROSE 2-4 GM/100ML-% IV SOLN
INTRAVENOUS | Status: AC
  Filled 2022-09-14: qty 100

## 2022-09-14 MED ORDER — MIDAZOLAM HCL 2 MG/2ML IJ SOLN
INTRAMUSCULAR | Status: DC | PRN
  Administered 2022-09-14: 2 mg via INTRAVENOUS

## 2022-09-14 MED ORDER — DEXAMETHASONE SODIUM PHOSPHATE 10 MG/ML IJ SOLN
INTRAMUSCULAR | Status: DC | PRN
  Administered 2022-09-14: 10 mg via INTRAVENOUS

## 2022-09-14 MED ORDER — FENTANYL CITRATE (PF) 100 MCG/2ML IJ SOLN: 25.0000 ug | INTRAMUSCULAR | Status: DC | PRN

## 2022-09-14 MED ORDER — ASPIRIN 325 MG PO TBEC: 325.0000 mg | DELAYED_RELEASE_TABLET | Freq: Two times a day (BID) | ORAL | 0 refills | Status: AC

## 2022-09-14 MED ORDER — ONDANSETRON HCL 4 MG/2ML IJ SOLN
INTRAMUSCULAR | Status: DC | PRN
  Administered 2022-09-14: 4 mg via INTRAVENOUS

## 2022-09-14 MED ORDER — FENTANYL CITRATE (PF) 100 MCG/2ML IJ SOLN
INTRAMUSCULAR | Status: DC | PRN
  Administered 2022-09-14 (×4): 25 ug via INTRAVENOUS

## 2022-09-14 MED ORDER — BUPIVACAINE LIPOSOME 1.3 % IJ SUSP
INTRAMUSCULAR | Status: AC
  Filled 2022-09-14: qty 20

## 2022-09-14 MED ORDER — FENTANYL CITRATE (PF) 100 MCG/2ML IJ SOLN
INTRAMUSCULAR | Status: AC
  Filled 2022-09-14: qty 2

## 2022-09-14 MED ORDER — PROPOFOL 10 MG/ML IV BOLUS
INTRAVENOUS | Status: DC | PRN
  Administered 2022-09-14: 100 mg via INTRAVENOUS

## 2022-09-14 MED ORDER — DEXAMETHASONE SODIUM PHOSPHATE 10 MG/ML IJ SOLN
INTRAMUSCULAR | Status: AC
  Filled 2022-09-14: qty 1

## 2022-09-14 MED ORDER — FAMOTIDINE 20 MG PO TABS
ORAL_TABLET | ORAL | Status: AC
  Administered 2022-09-14: 20 mg via ORAL
  Filled 2022-09-14: qty 1

## 2022-09-14 MED ORDER — PROPOFOL 10 MG/ML IV BOLUS
INTRAVENOUS | Status: AC
  Filled 2022-09-14: qty 20

## 2022-09-14 MED ORDER — BUPIVACAINE HCL (PF) 0.5 % IJ SOLN
INTRAMUSCULAR | Status: AC
  Filled 2022-09-14: qty 30

## 2022-09-14 SURGICAL SUPPLY — 72 items
APL PRP STRL LF DISP 70% ISPRP (MISCELLANEOUS) ×2
BIT DRILL 2 CANN SM BONE QF (BIT) IMPLANT
BIT DRILL 2.2 CANN STRGHT (BIT) IMPLANT
BIT DRILL PROVILE MINI CMPRSSN (INSTRUMENTS) IMPLANT
BLADE AVERAGE 25X9 (BLADE) ×2 IMPLANT
BLADE MED AGGRESSIVE (BLADE) IMPLANT
BLADE OSC/SAG .038X5.5 CUT EDG (BLADE) ×2 IMPLANT
BLADE SAW LAPIPLASTY 40X11 (BLADE) IMPLANT
BLADE SAW SAG 25X90X1.19 (BLADE) IMPLANT
BLADE SURG 15 STRL LF DISP TIS (BLADE) ×4 IMPLANT
BLADE SURG 15 STRL SS (BLADE) ×4
BNDG CMPR 75X21 PLY HI ABS (MISCELLANEOUS) ×2
BNDG ELASTIC 3X5.8 VLCR STR LF (GAUZE/BANDAGES/DRESSINGS) ×2 IMPLANT
BNDG ELASTIC 4X5.8 VLCR STR LF (GAUZE/BANDAGES/DRESSINGS) ×2 IMPLANT
BNDG ESMARCH 4 X 12 STRL LF (GAUZE/BANDAGES/DRESSINGS)
BNDG ESMARCH 4X12 STRL LF (GAUZE/BANDAGES/DRESSINGS) IMPLANT
BNDG GAUZE DERMACEA FLUFF 4 (GAUZE/BANDAGES/DRESSINGS) ×2 IMPLANT
BNDG GZE DERMACEA 4 6PLY (GAUZE/BANDAGES/DRESSINGS) ×2
BOOT STEPPER DURA SM (SOFTGOODS) IMPLANT
CHLORAPREP W/TINT 26 (MISCELLANEOUS) ×2 IMPLANT
COVER BACK TABLE 60X90IN (DRAPES) ×2 IMPLANT
CUFF TOURN SGL QUICK 18X4 (TOURNIQUET CUFF) IMPLANT
DRAPE EXTREMITY 106X87X128.5 (DRAPES) ×2 IMPLANT
DRAPE IMP U-DRAPE 54X76 (DRAPES) ×2 IMPLANT
DRAPE OEC MINIVIEW 54X84 (DRAPES) ×2 IMPLANT
DRAPE U-SHAPE 47X51 STRL (DRAPES) ×2 IMPLANT
DRILL PROVILE MINI COMPRESSION (INSTRUMENTS) ×2
ELECT REM PT RETURN 9FT ADLT (ELECTROSURGICAL) ×2
ELECTRODE REM PT RTRN 9FT ADLT (ELECTROSURGICAL) ×2 IMPLANT
Fast grafter IMPLANT
GAUZE 4X4 16PLY ~~LOC~~+RFID DBL (SPONGE) ×2 IMPLANT
GAUZE SPONGE 4X4 12PLY STRL (GAUZE/BANDAGES/DRESSINGS) ×2 IMPLANT
GAUZE STRETCH 2X75IN STRL (MISCELLANEOUS) ×2 IMPLANT
GLOVE SURG ORTHO LTX SZ7.5 (GLOVE) ×2 IMPLANT
GOWN STRL REUS W/ TWL LRG LVL3 (GOWN DISPOSABLE) ×2 IMPLANT
GOWN STRL REUS W/TWL LRG LVL3 (GOWN DISPOSABLE) ×2
GUIDEWIRE .045IN 1.14MM (WIRE) IMPLANT
GUIDEWIRE .045XTROC TIP LSR LN (WIRE) IMPLANT
HARVESTER BONE GRAFT 6 (INSTRUMENTS) IMPLANT
INST FASTGRAFTER HARVEST SYS (INSTRUMENTS) ×2
INST GUIDED SPEEDRELEASE (INSTRUMENTS) ×2
INSTRUMENT FASTGRFTR HRVST SYS (INSTRUMENTS) IMPLANT
INSTRUMENT GUIDED SPEEDRELEASE (INSTRUMENTS) IMPLANT
K-WIRE 1.1 (WIRE) ×2
KIT TURNOVER CYSTO (KITS) ×2 IMPLANT
LAPIPLASTY SYS 4A (Orthopedic Implant) ×2 IMPLANT
NDL HYPO 25X1 1.5 SAFETY (NEEDLE) IMPLANT
NEEDLE HYPO 25X1 1.5 SAFETY (NEEDLE) IMPLANT
NS IRRIG 1000ML POUR BTL (IV SOLUTION) IMPLANT
PACK EXTREMITY ARMC (MISCELLANEOUS) ×2 IMPLANT
PAD CAST 4YDX4 CTTN HI CHSV (CAST SUPPLIES) ×2 IMPLANT
PADDING CAST COTTON 4X4 STRL (CAST SUPPLIES) ×2
PENCIL SMOKE EVACUATOR (MISCELLANEOUS) ×2 IMPLANT
SCREW 2.7 HIGH PITCH LOCKING (Screw) IMPLANT
SCREW CANN.3.0X34 (Screw) IMPLANT
SCREW COMPR FT QF 3.5X24 (Screw) IMPLANT
SPEED RELEASE IMPLANT
STOCKINETTE STRL 6IN 960660 (GAUZE/BANDAGES/DRESSINGS) ×2 IMPLANT
SUCTION FRAZIER HANDLE 10FR (MISCELLANEOUS) ×2
SUCTION TUBE FRAZIER 10FR DISP (MISCELLANEOUS) ×2 IMPLANT
SUT ETHILON 3 0 PS 1 (SUTURE) ×4 IMPLANT
SUT ETHILON 4-0 (SUTURE) ×2
SUT ETHILON 4-0 FS2 18XMFL BLK (SUTURE) ×2
SUT MNCRL AB 3-0 PS2 27 (SUTURE) ×2 IMPLANT
SUT VIC AB 3-0 SH 27 (SUTURE)
SUT VIC AB 3-0 SH 27X BRD (SUTURE) IMPLANT
SUTURE ETHLN 4-0 FS2 18XMF BLK (SUTURE) IMPLANT
SYR BULB EAR ULCER 3OZ GRN STR (SYRINGE) ×2 IMPLANT
SYSTEM LAPIPLASTY 4A (Orthopedic Implant) IMPLANT
TOWEL OR 17X26 4PK STRL BLUE (TOWEL DISPOSABLE) ×2 IMPLANT
TUBING CONNECTOR 18X5MM (MISCELLANEOUS) ×2 IMPLANT
UNDERPAD 30X36 HEAVY ABSORB (UNDERPADS AND DIAPERS) ×2 IMPLANT

## 2022-09-14 NOTE — Anesthesia Postprocedure Evaluation (Signed)
Anesthesia Post Note  Patient: Environmental health practitioner  Procedure(s) Performed: HALLUX VALGUS LAPIDUS WITH BONE GRAFT (Right) METATARSAL OSTEOTOMY (Right: Toe)  Patient location during evaluation: PACU Anesthesia Type: General Level of consciousness: awake and alert, oriented and patient cooperative Pain management: pain level controlled Vital Signs Assessment: post-procedure vital signs reviewed and stable Respiratory status: spontaneous breathing, nonlabored ventilation and respiratory function stable Cardiovascular status: blood pressure returned to baseline and stable Postop Assessment: adequate PO intake Anesthetic complications: no   No notable events documented.   Last Vitals:  Vitals:   09/14/22 1015 09/14/22 1030  BP: 129/76 130/74  Pulse: 73 60  Resp: 12 19  Temp: (!) 36.1 C (!) 36.2 C  SpO2: 98% 99%    Last Pain:  Vitals:   09/14/22 1030  TempSrc:   PainSc: 0-No pain                 Darrin Nipper

## 2022-09-14 NOTE — Anesthesia Preprocedure Evaluation (Addendum)
Anesthesia Evaluation  Patient identified by MRN, date of birth, ID band Patient awake    Reviewed: Allergy & Precautions, NPO status , Patient's Chart, lab work & pertinent test results  History of Anesthesia Complications Negative for: history of anesthetic complications  Airway Mallampati: III   Neck ROM: Full    Dental  (+) Missing   Pulmonary neg pulmonary ROS   Pulmonary exam normal breath sounds clear to auscultation       Cardiovascular Exercise Tolerance: Good negative cardio ROS Normal cardiovascular exam Rhythm:Regular Rate:Normal     Neuro/Psych  PSYCHIATRIC DISORDERS Anxiety Depression    negative neurological ROS     GI/Hepatic negative GI ROS,,,  Endo/Other  negative endocrine ROS    Renal/GU negative Renal ROS     Musculoskeletal   Abdominal   Peds  Hematology negative hematology ROS (+)   Anesthesia Other Findings   Reproductive/Obstetrics                             Anesthesia Physical Anesthesia Plan  ASA: 2  Anesthesia Plan: General   Post-op Pain Management:    Induction: Intravenous  PONV Risk Score and Plan: 3 and Ondansetron, Dexamethasone and Treatment may vary due to age or medical condition  Airway Management Planned: LMA  Additional Equipment:   Intra-op Plan:   Post-operative Plan: Extubation in OR  Informed Consent: I have reviewed the patients History and Physical, chart, labs and discussed the procedure including the risks, benefits and alternatives for the proposed anesthesia with the patient or authorized representative who has indicated his/her understanding and acceptance.     Dental advisory given  Plan Discussed with: CRNA  Anesthesia Plan Comments: (Patient consented for risks of anesthesia including but not limited to:  - adverse reactions to medications - damage to eyes, teeth, lips or other oral mucosa - nerve damage due to  positioning  - sore throat or hoarseness - damage to heart, brain, nerves, lungs, other parts of body or loss of life  Informed patient about role of CRNA in peri- and intra-operative care.  Patient voiced understanding.)       Anesthesia Quick Evaluation

## 2022-09-14 NOTE — Brief Op Note (Signed)
09/14/2022  9:51 AM  PATIENT:  Erin Dennis  42 y.o. female  PRE-OPERATIVE DIAGNOSIS:  Hallux valgus with bunions, right Acquired hallux interphalangeus, right  POST-OPERATIVE DIAGNOSIS:  Hallux valgus with bunions, rightAcquired hallux interphalangeus, right  PROCEDURE:  Procedure(s): HALLUX VALGUS CORRECTION WITH METATARSAL OSTEOTOMY (Right) ARTHRODESIS 1st TMT BONE GRAFT MINOR   SURGEON:  Surgeon(s) and Role:    * Kynan Peasley, Stephan Minister, DPM - Primary  PHYSICIAN ASSISTANT:   ASSISTANTS: none   ANESTHESIA:   local and general  EBL:  10 mL   BLOOD ADMINISTERED:none  DRAINS: none   LOCAL MEDICATIONS USED:  LIDOCAINE  EXPAREL and Amount: 10 ml each  SPECIMEN:  No Specimen  DISPOSITION OF SPECIMEN:  N/A  COUNTS:  YES  TOURNIQUET:   Total Tourniquet Time Documented: Calf (Right) - 97 minutes Total: Calf (Right) - 97 minutes   DICTATION: .Note written in EPIC  PLAN OF CARE: Discharge to home after PACU  PATIENT DISPOSITION:  PACU - hemodynamically stable.   Delay start of Pharmacological VTE agent (>24hrs) due to surgical blood loss or risk of bleeding: no

## 2022-09-14 NOTE — Op Note (Signed)
Patient Name: Erin Dennis DOB: 1980-08-13  MRN: [id number]   Date of Service: 09/14/2022  Surgeon: Dr. 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 Item Serial No. Manufacturer Lot No. LRB No. Used Action  LAPIPLASTY SYS 4A - [id number] Orthopedic Implant Margorie John MEDICAL [id number] Right 1 Implanted  SCREW 2.7 HIGH PITCH LOCKING - [id number] Screw SCREW 2.7 HIGH PITCH [facility] MEDICAL [id number] Right 2 Implanted  SCREW CANN.3.0X34 - [id number] Screw SCREW CANN.3.0X34  [employer]  Right 1 Implanted  SCREW COMPR FT QF 3.5X24 - [id number] Screw SCREW COMPR FT QF 3.5X24  [employer]  Right 1 Implanted   Medications: 10 cc Exparel and 10 cc lidocaine 2% plain Complications: No complications noted  Indications for Procedure:  This is a 42 y.o. female with a history of hallux valgus and metatarsus primus varus deformity of the right foot.  She previously underwent surgical correction of her left foot.  She presents today for correction of her right foot. All risks, benefits and potential complications discussed prior to the procedure. All questions addressed. Informed consent signed and reviewed.      Procedure in Detail: Patient was identified in pre-operative holding area. Formal consent was signed and the right lower extremity was marked.  Patient was brought back to the operating room. Anesthesia was induced. The extremity was prepped and draped in the usual sterile fashion. Timeout was taken to confirm patient name, laterality, and procedure prior to incision.   Attention was then directed to the right foot where a dorsal longitudinal incision was made over the first tarsometatarsal joint.  This was placed medial to the extensor hallucis longus tendon.  Dissection was carried through subcutaneous tissues, ensuring that all vital neurovascular structures were protected throughout the procedure.  Bleeders were cauterized as necessary.  The deep fascia and periosteum was incised, dissection was carried to bone and the extensor hallucis longus tendon within its sheath and soft tissues were retracted laterally.  The periosteum was reflected in the first tarsometatarsal joint capsule and ligaments were incised and the joint was exposed.    A small incision was made in the first interspace. Under fluoroscopy, the lateral capsule and sesamoidal suspensory ligament were then released using a speed release instrument while a varus maneuver was made on the hallux with good release of the sesamoid complex.    A sagittal saw was then used to plane the first tarsometatarsal joint.  An osteotome was used to free plantar ligamentous attachments of the joint.  Once the joint mobilized the fulcrum was placed into the lateral base of the first metatarsal.  The jig was then placed onto the medial foot and the intermetatarsal angle was reduced with engagement of the windlass mechanism and varus rotation of the first metatarsal to correct the deformity.  This was done under fluoroscopy.  Once adequate correction was obtained a temporary fixation wire was placed through the jig.  2 dorsal Steinmann pins were  then placed into the base of the first metatarsal and medial cuneiform.  The joint seeker and fulcrum were then placed into the joint once more and the cut  guide was placed over the Steinmann pins.  The base of the first metatarsal and distal cartilage and articular surface and subchondral bone of the medial cuneiform was then resected using a sagittal saw through the cut guide.  The subchondral bone plate and cartilage was then removed.  Removal of the bone corrected the metatarsals primus varus deformity.  The remaining bone was then fenestrated using a drill.  I then directed my attention to the lateral heel where a 1 cm incision was made 2 cm anterior to the posterior border of the heel and 2 cm proximal to the glabrous junction.  A hemostat and freer elevator was used to elevate the soft tissues off the lateral wall the calcaneus.  The bone graft reamer was then used to procure 1 to 2 cc of autogenous cancellous bone graft.  This bone graft was taken and placed into the arthrodesis site of the first tarsometatarsal joint.  The calcaneal incision was then irrigated and closed with 3-0 nylon.    The compression jig was then placed over the Steinmann pins and the arthrodesis site was compressed under manual tension with the correction maintained with the reduction jig.  Once adequate compression and maintenance of correction of the deformity was noted under fluoroscopy it was then temporarily fixated using all olivefwires.  The compression jig was then removed and the plate was put into position.  This was then temporarily fixated and sequentially drilled and fixated using multiple locking screws.  I then exposed the medial surface of the arthrodesis site and placed the medial plate orthogonal to the dorsal plate.  All positions were checked with fluoroscopy.    At this point I reexamined the clinical and radiographic result and the metatarsals primus varus deformity has been corrected.  There was residual hallux valgus and abductus at the metatarsal phalangeal joint.  The toe was track bound in the groove medially.  I then carried the incision distally over  the metatarsal phalangeal joint.  Dissection was carried deep through subcutaneous tissue, cauterizing bleeding vessels as necessary.  The neurovascular structures were protected.  Periosteum and capsule was reflected from the medial first metatarsal head.  This revealed a large bunion and medial eminence.  This was resected with a sagittal saw.  I used the sagittal saw then to create a distal metatarsal Reverdin osteotomy with a medially based wedge.  This was reduced and fixated with a Kirschner wire.  This corrected the remaining hallux valgus deformity and abduction of the great toe.  A guidewire for the headless screw was then placed from proximal dorsal to distal plantar.  The screw was drilled measured and inserted.  Good compression was noted of the osteotomy.  The Kirschner wire was removed.  There is good smooth range of motion of the great toe MTPJ.  The medial eminence was rasped smooth.  The incision was then thoroughly irrigated.  A small wedge of medial capsule was excised for a medial capsulorrhaphy and tightened with 3-0 Vicryl.   Final films were then taken with a satisfactory result in correction of the deformity.  The wound was then thoroughly irrigated with normal sterile saline.  The incisions were then closed using 3-0 Vicryl, 3-0 Monocryl and 4-0 nylon.   The foot was then dressed with Xeroform, 4 x 4 gauze, Kling, Kerlix, Ace  wrap under light compression.  She was placed into a tall CAM boot. Patient tolerated the procedure well.  All counts were correct and operative procedure.  She was aroused from anesthesia and transferred back to the recovery area in good condition.   Disposition: Following a period of post-operative monitoring, patient will be transferred to home.  She will remain nonweightbearing in a cam walker boot with a knee scooter.

## 2022-09-14 NOTE — Anesthesia Procedure Notes (Signed)
Procedure Name: LMA Insertion Date/Time: 09/14/2022 7:35 AM  Performed by: Natasha Mead, CRNAPre-anesthesia Checklist: Patient identified, Emergency Drugs available, Suction available, Patient being monitored and Timeout performed Patient Re-evaluated:Patient Re-evaluated prior to induction Oxygen Delivery Method: Circle system utilized Preoxygenation: Pre-oxygenation with 100% oxygen Induction Type: IV induction Ventilation: Mask ventilation without difficulty LMA: LMA inserted LMA Size: 4.0 Number of attempts: 1

## 2022-09-14 NOTE — H&P (Signed)
History and Physical Interval Note:  09/14/2022 7:22 AM  Erin Dennis  has presented today for surgery, with the diagnosis of right foot bunion.  The various methods of treatment have been discussed with the patient and family. After consideration of risks, benefits and other options for treatment, the patient has consented to   Procedure(s): [place] (Right) METATARSAL OSTEOTOMY (Right) as a surgical intervention.  The patient's history has been reviewed, patient examined, no change in status, stable for surgery.  I have reviewed the patient's chart and labs.  Questions were answered to the patient's satisfaction.     [name]

## 2022-09-14 NOTE — Transfer of Care (Signed)
Immediate Anesthesia Transfer of Care Note  Patient: Erin Dennis  Procedure(s) Performed: HALLUX VALGUS LAPIDUS WITH BONE GRAFT (Right) METATARSAL OSTEOTOMY (Right: Toe)  Patient Location: PACU  Anesthesia Type:General  Level of Consciousness: drowsy  Airway & Oxygen Therapy: Patient Spontanous Breathing and Patient connected to face mask oxygen  Post-op Assessment: Report given to RN and Post -op Vital signs reviewed and stable  Post vital signs: stable  Last Vitals:  Vitals Value Taken Time  BP 115/53 09/14/22 0953  Temp    Pulse 70 09/14/22 0956  Resp 15 09/14/22 0956  SpO2 100 % 09/14/22 0956  Vitals shown include unvalidated device data.  Last Pain:  Vitals:   09/14/22 0622  TempSrc: Temporal  PainSc: 0-No pain         Complications: No notable events documented.

## 2022-09-14 NOTE — Discharge Instructions (Addendum)
Post-Surgery Instructions  1. If you are recuperating from surgery anywhere other than home, please be sure to leave [place] a number where you can be reached. 2. Go directly home and rest. 3. The keep operated foot (or feet) elevated six inches above the hip when sitting or lying down. 4. Support the elevated foot and leg with pillows under the calf. DO NOT PLACE PILLOWS UNDER THE KNEE. 5. DO NOT REMOVE or get your bandages wet. This will increase your chances of getting an infection. 6. Wear your surgical shoe at all times when you are up. 7. A limited amount of pain and swelling may occur. The skin may take on a bruised appearance. This is no cause for alarm. 8. For slight pain and swelling, apply an ice pack behind the knee and over the bandagefor 15 minutes every hour. Continue icing until seen in the office. DO NOT apply any form of heat to the area. 9. Have prescription(s) filled immediately and take as directed. 10. Drink lots of liquids, water, and juice. 11. CALL THE OFFICE IMMEDIATELY IF: a. Bleeding continues b. Pain increases and/or does not respond to medication c. Bandage or cast appears too tight d. Any liquids (water, coffee, etc.) have spilled on your bandages. e. Tripping, falling, or stubbing the surgical foot f. If your temperature rises above 101 g. If you have ANY questions at all 12. Please use the crutches, knee scooter, or walker you have prescribed, rented, or purchased. If you are non-weight bearing DO NOT put weight on the operated foot for _________ days. If you are weight-bearing, follow your physician's instructions. You are expected to be:   ? non-weight bearing 13. Special Instructions: BOOT CAN BE REMOVED WHEN RESTING MUST BE ON AND SECURE WHEN MOVING ON SCOOTER  14. Your next appointment is: 09/20/2022 8:45 AM   If you need to reach the nurse for any reason, please call: [place]: [phone] [place]: [phone] [place]: [phone]     AMBULATORY SURGERY  DISCHARGE INSTRUCTIONS  The drugs that you were given will stay in your system until tomorrow so for the next 24 hours you should not:  Drive an automobile Make any legal decisions Drink any alcoholic beverage  You may resume regular meals tomorrow.  Today it is better to start with liquids and gradually work up to solid foods.  You may eat anything you prefer, but it is better to start with liquids, then soup and crackers, and gradually work up to solid foods.  Please notify your doctor immediately if you have any unusual bleeding, trouble breathing, redness and pain at the surgery site, drainage, fever, or pain not relieved by medication.  Additional Instructions:  Please contact your physician with any problems or Same Day Surgery at [phone], Monday through Friday 6 am to 4 pm, or [place] at [facility] number at [phone].

## 2022-09-15 ENCOUNTER — Encounter: Payer: Self-pay | Admitting: Podiatry

## 2022-09-15 NOTE — Progress Notes (Signed)
Pt has bled through all bandages.  She has messaged dr Sherryle Lis and he informed her ok to change the dressing.  Pt asking what to put on; reviewed chart and gave pt directions for redressing (telfa, 4x4, kerlix, ace).  Pt has verbalized understanding and will let dr Sherryle Lis know if any concerns once takes dressing off.

## 2022-09-19 ENCOUNTER — Encounter: Payer: Self-pay | Admitting: *Deleted

## 2022-09-20 ENCOUNTER — Ambulatory Visit (INDEPENDENT_AMBULATORY_CARE_PROVIDER_SITE_OTHER): Payer: Medicaid Other | Admitting: Podiatry

## 2022-09-20 ENCOUNTER — Ambulatory Visit (INDEPENDENT_AMBULATORY_CARE_PROVIDER_SITE_OTHER): Payer: Medicaid Other

## 2022-09-20 DIAGNOSIS — M21611 Bunion of right foot: Secondary | ICD-10-CM | POA: Diagnosis not present

## 2022-09-20 DIAGNOSIS — M2011 Hallux valgus (acquired), right foot: Secondary | ICD-10-CM

## 2022-09-21 NOTE — Progress Notes (Signed)
  Subjective:  Patient ID: Erin Dennis, female    DOB: November 17, 1980,  MRN: [id number]  Chief Complaint  Patient presents with   Routine Post Op    POV #1 DOS 09/14/2022 BUNIONECTOMY OF RT FOOT (LAPIDUS, POSSIBLE [place]) BONE GRAFT HEEL    42 y.o. female returns for post-op check.  She says she is not having much pain.  She did note some bleeding.  She changed the dressing because of this.  Thinks the stitch pulled out on the side of the heel.  She presents ambulating in her cam boot, had to take the boot off to drive she says  Review of Systems: Negative except as noted in the HPI. Denies N/V/F/Ch.   Objective:  There were no vitals filed for this visit. There is no height or weight on file to calculate BMI. Constitutional Well developed. Well nourished.  Vascular Foot warm and well perfused. Capillary refill normal to all digits.  Calf is soft and supple, no posterior calf or knee pain, negative Homans' sign  Neurologic Normal speech. Oriented to person, place, and time. Epicritic sensation to light touch grossly present bilaterally.  Dermatologic Skin healing well without signs of infection. Skin edges well coapted without signs of infection.  Orthopedic: Tenderness to palpation noted about the surgical site.   Multiple view plain film radiographs: Good correction noted all hardware intact and equivalent to immediate postop films Assessment:   1. Hallux valgus with bunions, right    Plan:  Patient was evaluated and treated and all questions answered.  S/p foot surgery right -Progressing as expected post-operatively despite noncompliance with restricted weightbearing.  I advised her that she should not be driving.  We did dispense a surgical shoe if she does have to drive I would rather have her in this as opposed to no shoe at all. -XR: Noted above no complication -WB Status: NWB in cam walker boot.  Can transition to weightbearing as tolerated in boot after next  visit -Sutures: Return in 2 weeks for removal.   -Medications: No refills required -Foot redressed.  She may begin showering on Friday  Return in about 2 weeks (around 10/04/2022) for post op (no x-rays), suture removal.

## 2022-09-23 MED ORDER — GABAPENTIN 300 MG PO CAPS: 300.0000 mg | ORAL_CAPSULE | Freq: Three times a day (TID) | ORAL | 1 refills | Status: DC

## 2022-09-26 ENCOUNTER — Ambulatory Visit: Payer: Medicaid Other | Admitting: Podiatry

## 2022-09-26 ENCOUNTER — Telehealth: Payer: Self-pay | Admitting: Podiatry

## 2022-09-26 NOTE — Telephone Encounter (Signed)
Pt called stating she was told to come in to get stitches removed in 2 wks but was scheduled fpr 3 wks. She stated the stitches are red and burning. I offered pt an appt for 2.27 when Dr Sherryle Lis was in the [place] office and she stated she could not come she did not have a babysitter and wanted to come today.  I transferred to the nurse triage to see if they could talk with her and she did not leave a message. She called right back and asked for the [place] office nurse to see if she could come today or if she needed to go to the ed. Because her foot was oozing. I talked with Barbaraann Rondo the administrator and she said for her to come now and see Dr Blenda Mounts by 3pm and pt was in [place] so she could not make it by 3. Keely then said she could see Dr Amalia Hailey for her to come on. She said she would come on and hung up. She then called back and stated she needed a different appt for later this week  or she could keep appt for next week as her son just woke up with a fever. I rescheduled her for 2.28.2024 to see Dr Sherryle Lis.I did not cxl appt for next week just in case she needed it.

## 2022-09-27 NOTE — Telephone Encounter (Signed)
noted 

## 2022-09-28 ENCOUNTER — Ambulatory Visit (INDEPENDENT_AMBULATORY_CARE_PROVIDER_SITE_OTHER): Payer: Medicaid Other | Admitting: Podiatry

## 2022-09-28 DIAGNOSIS — M2011 Hallux valgus (acquired), right foot: Secondary | ICD-10-CM

## 2022-09-28 DIAGNOSIS — M21611 Bunion of right foot: Secondary | ICD-10-CM

## 2022-09-29 ENCOUNTER — Encounter: Payer: Self-pay | Admitting: Podiatry

## 2022-09-29 ENCOUNTER — Encounter: Payer: Medicaid Other | Admitting: Podiatry

## 2022-09-29 NOTE — Progress Notes (Signed)
  Subjective:  Patient ID: Erin Dennis, female    DOB: 1980-12-07,  MRN: [id number]  Chief Complaint  Patient presents with   Post-op Problem    DOS 09/14/2022 BUNIONECTOMY OF RT FOOT (LAPIDUS, POSSIBLE [place] REVERDIN) BONE GRAFT HEEL/DR Crucita Lacorte PT/ pt states stitches are red and burning and oozing added per keely/ pt coming now from [place]    42 y.o. female returns for post-op check. She says the sutures are irritated. She presents ambulating in the surgical shoe   Review of Systems: Negative except as noted in the HPI. Denies N/V/F/Ch.   Objective:  There were no vitals filed for this visit. There is no height or weight on file to calculate BMI. Constitutional Well developed. Well nourished.  Vascular Foot warm and well perfused. Capillary refill normal to all digits.  Calf is soft and supple, no posterior calf or knee pain, negative Homans' sign  Neurologic Normal speech. Oriented to person, place, and time. Epicritic sensation to light touch grossly present bilaterally.  Dermatologic Incision well healed, non hypertrophic. Minor erythema at suture sites, does not extend beyond incision sites  Orthopedic: Tenderness to palpation noted about the surgical site. Moderate edema   Multiple view plain film radiographs: Good correction noted all hardware intact and equivalent to immediate postop films Assessment:   1. Hallux valgus with bunions, right     Plan:  Patient was evaluated and treated and all questions answered.  S/p foot surgery right -Sutures removed uneventfully today.  May continue regular bathing.  Continue use of Ace wrap for compression and edema reduction continue icing.  Ambulate only in the cam boot at home she may continue to use the surgical shoe only for driving to come to my appointments, should not be going out to do additional activities [place] or significant walking on the foot.  I will see her back in 4 weeks for follow-up for new radiographs No  follow-ups on file.

## 2022-10-04 ENCOUNTER — Other Ambulatory Visit: Payer: Medicaid Other

## 2022-10-12 ENCOUNTER — Telehealth: Payer: Self-pay

## 2022-10-12 ENCOUNTER — Ambulatory Visit (INDEPENDENT_AMBULATORY_CARE_PROVIDER_SITE_OTHER): Payer: Medicaid Other | Admitting: Podiatry

## 2022-10-12 ENCOUNTER — Ambulatory Visit (INDEPENDENT_AMBULATORY_CARE_PROVIDER_SITE_OTHER): Payer: Medicaid Other

## 2022-10-12 ENCOUNTER — Encounter: Payer: Self-pay | Admitting: Podiatry

## 2022-10-12 DIAGNOSIS — M21611 Bunion of right foot: Secondary | ICD-10-CM

## 2022-10-12 DIAGNOSIS — M2011 Hallux valgus (acquired), right foot: Secondary | ICD-10-CM

## 2022-10-12 DIAGNOSIS — Z91199 Patient's noncompliance with other medical treatment and regimen due to unspecified reason: Secondary | ICD-10-CM

## 2022-10-12 MED ORDER — LIDOCAINE 5 % EX PTCH: 1.0000 | MEDICATED_PATCH | CUTANEOUS | 0 refills | Status: DC

## 2022-10-12 MED ORDER — DOXYCYCLINE HYCLATE 100 MG PO TABS: 100.0000 mg | ORAL_TABLET | Freq: Two times a day (BID) | ORAL | 0 refills | Status: DC

## 2022-10-12 MED ORDER — CLOBETASOL PROPIONATE 0.05 % EX CREA: 1.0000 | TOPICAL_CREAM | Freq: Two times a day (BID) | CUTANEOUS | 0 refills | Status: DC

## 2022-10-12 MED ORDER — OXYCODONE HCL 5 MG PO TABS: 5.0000 mg | ORAL_TABLET | ORAL | 0 refills | Status: AC | PRN

## 2022-10-12 NOTE — Telephone Encounter (Signed)
error 

## 2022-10-12 NOTE — Telephone Encounter (Signed)
Patient called about pain from surgery in the arch of the foot, will have Latoya call and schedule patient for today.

## 2022-10-13 ENCOUNTER — Ambulatory Visit: Payer: Medicaid Other | Admitting: Podiatry

## 2022-10-13 ENCOUNTER — Other Ambulatory Visit: Payer: Medicaid Other

## 2022-10-17 ENCOUNTER — Encounter: Payer: Self-pay | Admitting: Podiatry

## 2022-10-17 NOTE — Progress Notes (Signed)
  Subjective:  Patient ID: Geralyn Flash, female    DOB: Nov 27, 1980,  MRN: [id number]  Chief Complaint  Patient presents with   Post-op Problem    Urgent work in post op problems    42 y.o. female returns for post-op check.  She is here today for an urgent visit noting that she has been in severe pain and pain medication is no longer helping she says she has been trying to stay off of it and icing and elevating  Review of Systems: Negative except as noted in the HPI. Denies N/V/F/Ch.   Objective:  There were no vitals filed for this visit. There is no height or weight on file to calculate BMI. Constitutional Well developed. Well nourished.  Vascular Foot warm and well perfused. Capillary refill normal to all digits.  Calf is soft and supple, no posterior calf or knee pain, negative Homans' sign  Neurologic Normal speech. Oriented to person, place, and time. Epicritic sensation to light touch grossly present bilaterally.  No signs of CRPS type I or II to  Dermatologic Incision well healed, non hypertrophic. Minor erythema at suture sites, does not extend beyond incision sites, does not have evidence of infection there is no cellulitis or drainage or purulence.  Tenderness here  Orthopedic: Tenderness to palpation noted about the surgical site.  Significant edema   Multiple view plain film radiographs: Correction is maintained there is no complication of hardware or loss of correction, good early consolidation across fusion and osteotomy sites Assessment:   1. Hallux valgus with bunions, right   2. Acquired hallux interphalangeus, right     Plan:  Patient was evaluated and treated and all questions answered.  S/p foot surgery right -We reviewed her radiographs there is maintained correction.  She has a moderate amount edema.  There is quite a bit of erythema I suspect she is having some sort of reaction to the deep absorbable sutures.  Recommend she continue to ice and elevate.  I  did prescribe her Temovate ointment to apply along the incision to see if this alleviates any inflammation.  Lidoderm patch and oxycodone refill sent unfortunately is going to take time to completely heal and alleviate itself  No follow-ups on file.

## 2022-10-25 ENCOUNTER — Encounter: Payer: Medicaid Other | Admitting: Podiatry

## 2022-11-01 ENCOUNTER — Encounter: Payer: Medicaid Other | Admitting: Podiatry

## 2022-11-03 ENCOUNTER — Encounter: Payer: Medicaid Other | Admitting: Podiatry

## 2022-11-07 ENCOUNTER — Ambulatory Visit (INDEPENDENT_AMBULATORY_CARE_PROVIDER_SITE_OTHER): Payer: Medicaid Other

## 2022-11-07 ENCOUNTER — Ambulatory Visit (INDEPENDENT_AMBULATORY_CARE_PROVIDER_SITE_OTHER): Payer: Medicaid Other | Admitting: Podiatry

## 2022-11-07 ENCOUNTER — Encounter: Payer: Self-pay | Admitting: Podiatry

## 2022-11-07 DIAGNOSIS — M2011 Hallux valgus (acquired), right foot: Secondary | ICD-10-CM

## 2022-11-07 DIAGNOSIS — Z9889 Other specified postprocedural states: Secondary | ICD-10-CM | POA: Diagnosis not present

## 2022-11-07 DIAGNOSIS — M21611 Bunion of right foot: Secondary | ICD-10-CM

## 2022-11-07 MED ORDER — GABAPENTIN 300 MG PO CAPS: 300.0000 mg | ORAL_CAPSULE | Freq: Three times a day (TID) | ORAL | 0 refills | Status: DC | PRN

## 2022-11-08 ENCOUNTER — Encounter: Payer: Self-pay | Admitting: Podiatry

## 2022-11-08 NOTE — Progress Notes (Signed)
  Subjective:  Patient ID: Erin Dennis, female    DOB: [birth year],  MRN: [id number]  Chief Complaint  Patient presents with   Routine Post Op    "It's doing okay.  Both of my feet have a knot that has developed on the side.  It hurts and keeps me up at night."    42 y.o. female returns for post-op check.  Overall she is doing much better than she was last time I saw her, the skin is improving although there are still some areas of redness and irritation.  The swelling is still there but improving.  She has occasional sharp pains at night  Review of Systems: Negative except as noted in the HPI. Denies N/V/F/Ch.   Objective:  There were no vitals filed for this visit. There is no height or weight on file to calculate BMI. Constitutional Well developed. Well nourished.  Vascular Foot warm and well perfused. Capillary refill normal to all digits.  Calf is soft and supple, no posterior calf or knee pain, negative Homans' sign  Neurologic Normal speech. Oriented to person, place, and time. Epicritic sensation to light touch grossly present bilaterally.  No signs of CRPS type I or II   Dermatologic Incision well healed, non hypertrophic.  Still a few spots of small erythema suspect these are at sites of Monocryl absorbing.  Orthopedic: Good range of motion of toe.  Only mild edema today around the fusion site.   Multiple view plain film radiographs: New radiographs taken today show good consolidation across the fusion site and osteotomy site Assessment:   1. Hallux valgus with bunions, right   2. Status post surgery     Plan:  Patient was evaluated and treated and all questions answered.  S/p foot surgery right -Has improved quite a lot.  May resume regular shoe gear and low impact activity.  Discussed transition process and continue to ice and elevate after activity if bothersome.  She still having occasional neuritis and I prescribed her gabapentin to take as needed especially at  night when it bothers her.  I will see her back in 3 months for follow-up x-rays on both feet and a final visit  Return in about 3 months (around 02/06/2023) for surgery f/u with new xrays both feet.

## 2023-01-05 IMAGING — MR MR ANKLE*L* W/O CM
5 series · 36 of 40 positions shown · non-contrast
Comparison: Radiographs dated December 07, 2021

CLINICAL DATA: Ankle trauma, dislocation/ligamentous injury
suspected.

EXAM:
MRI OF THE LEFT ANKLE WITHOUT CONTRAST
TECHNIQUE: Multiplanar, multisequence MR imaging of the ankle was performed. No
intravenous contrast was administered.

[Series 4: T2 fat-sat · axial · 3.0mm · 0.50mm/px · z∈[-69,+52]mm · 8 of 32 slices shown (1 of 2)]
[im 1/32]
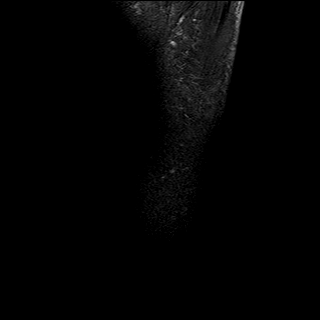
[im 4/32]
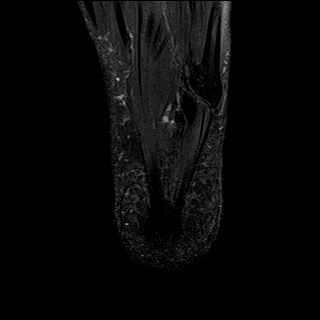
[im 11/32]
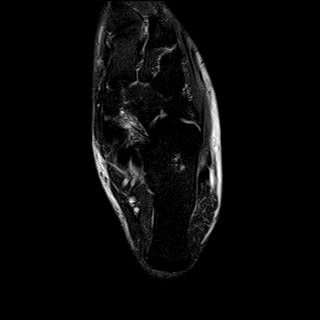
[im 14/32]
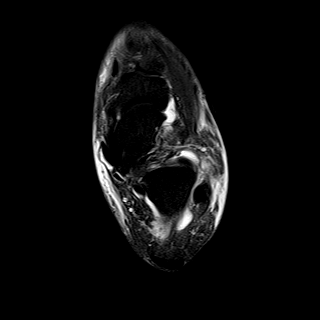
[im 18/32]
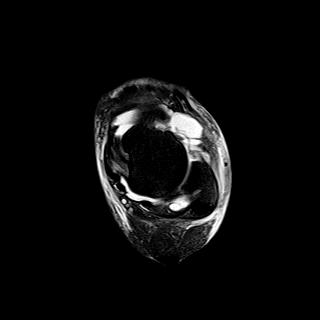
[im 21/32]
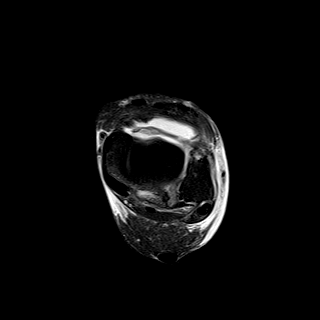
[im 28/32]
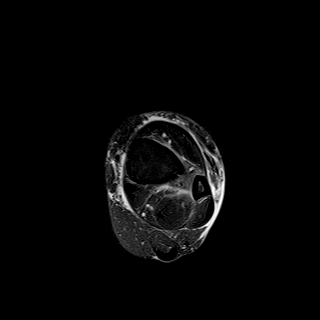
[im 32/32]
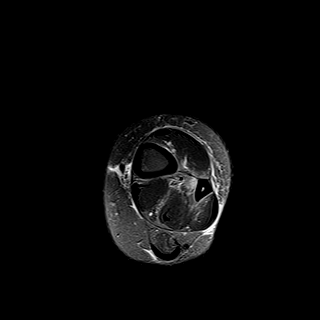

[Series 5: PD fat-sat · axial · 3.0mm · 0.42mm/px · z∈[-69,+52]mm · 9 of 32 slices shown]
[im 1/32]
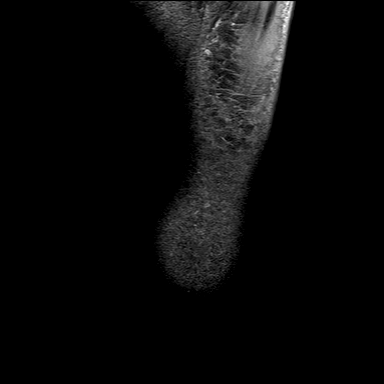
[im 4/32]
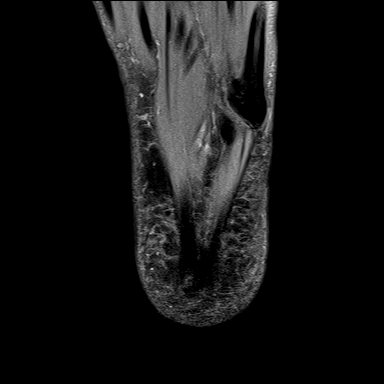
[im 8/32]
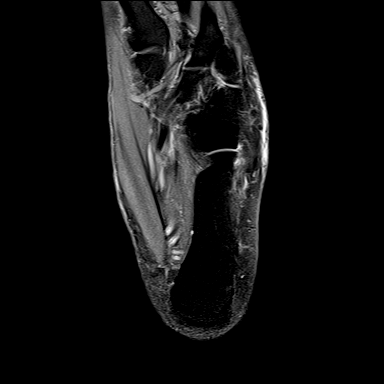
[im 12/32]
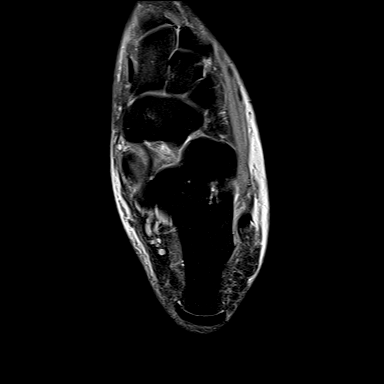
[im 16/32]
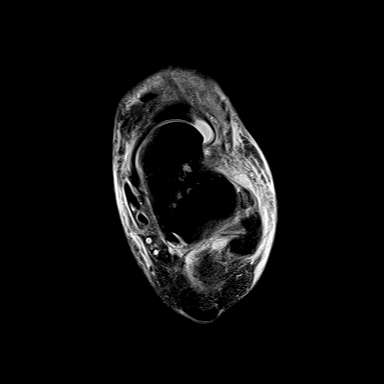
[im 20/32]
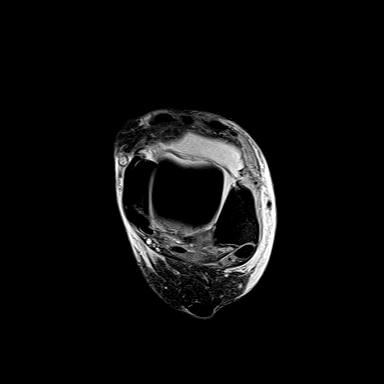
[im 24/32]
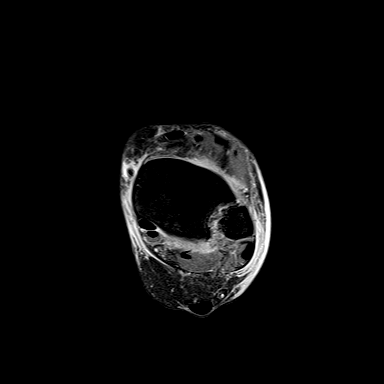
[im 28/32]
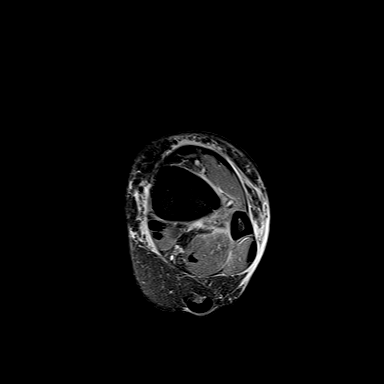
[im 32/32]
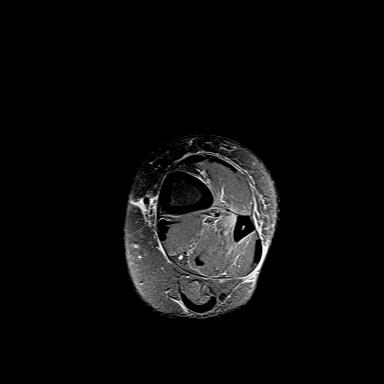

[Series 6: T1 · sagittal · 4.0mm · 0.56mm/px · 6 of 20 slices shown]
[im 1/20]
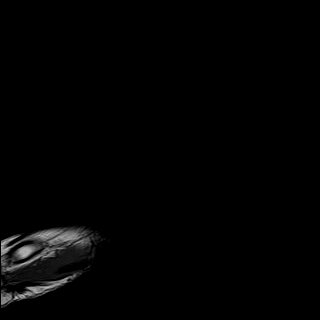
[im 4/20]
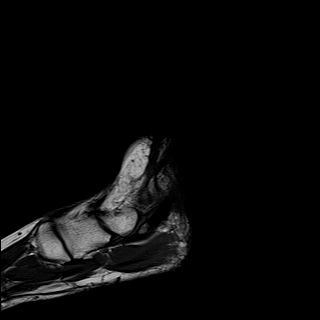
[im 8/20]
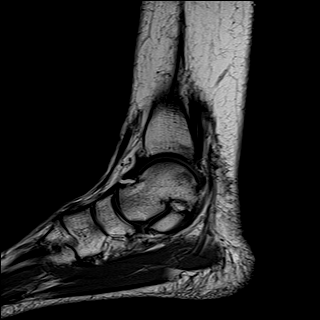
[im 12/20]
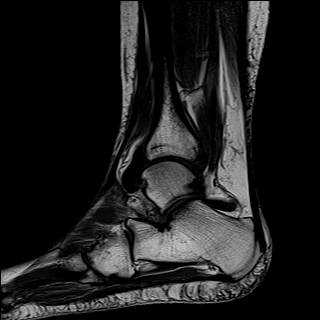
[im 16/20]
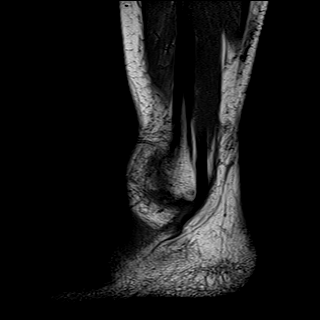
[im 20/20]
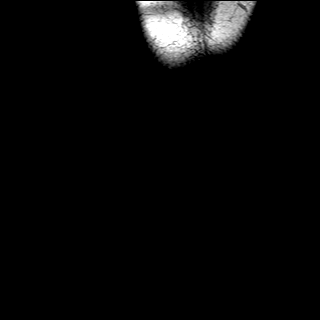

[Series 7: STIR · sagittal · 4.0mm · 0.35mm/px · 4 of 20 slices shown]
[im 1/20]
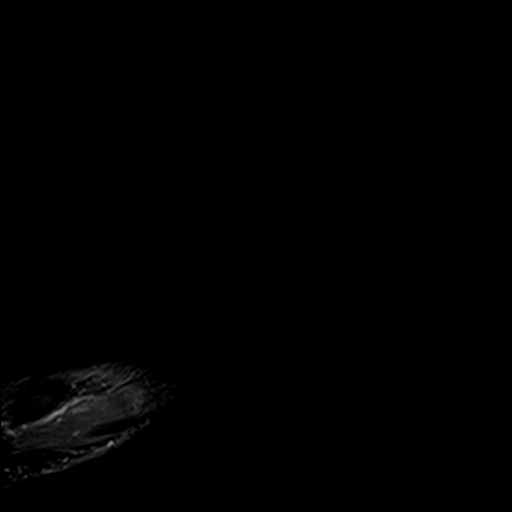
[im 4/20]
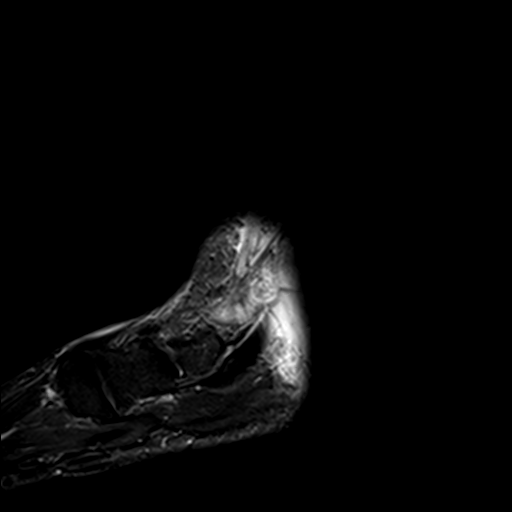
[im 8/20]
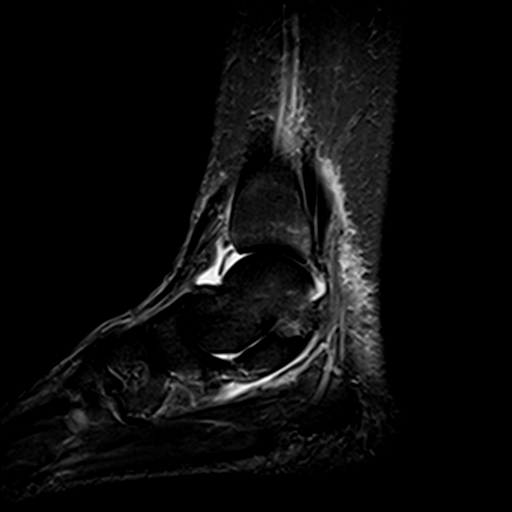
[im 12/20]
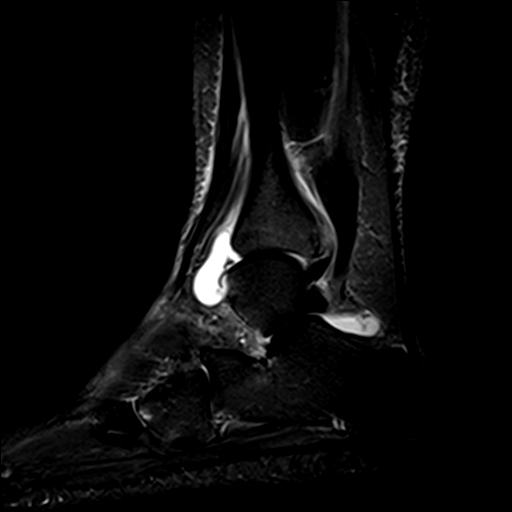

[Series 8: T2 fat-sat · coronal · 3.0mm · 0.50mm/px · 9 of 32 slices shown (2 of 2)]
[im 1/32]
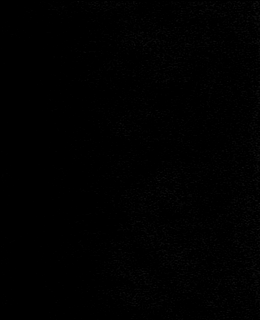
[im 4/32]
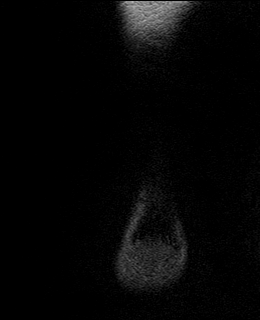
[im 8/32]
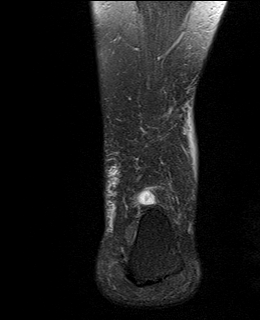
[im 12/32]
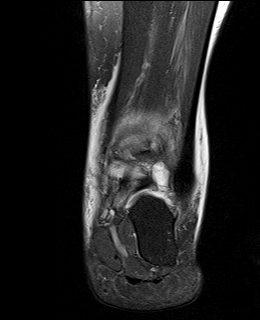
[im 16/32]
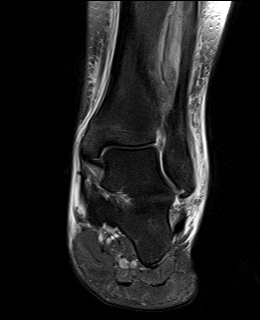
[im 20/32]
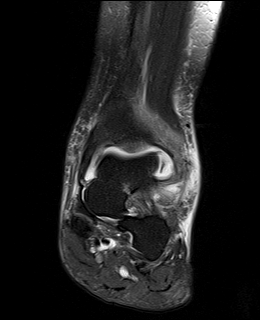
[im 24/32]
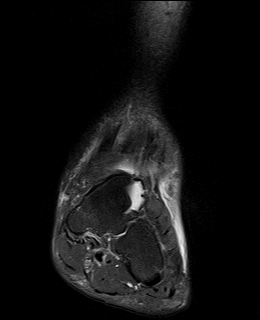
[im 28/32]
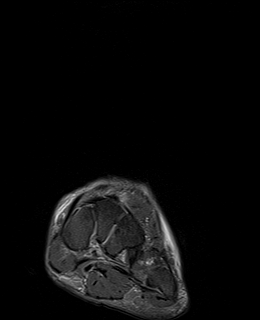
[im 32/32]
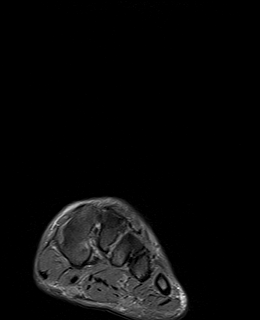

[36 of 40 positions shown; findings below may reference images not displayed]

FINDINGS: TENDONS

Peroneal: Peroneal longus tendon intact. Peroneal brevis intact.

Posteromedial: Posterior tibial tendon intact. Flexor hallucis
longus tendon intact. Flexor digitorum longus tendon intact.

Anterior: Tibialis anterior tendon intact. Extensor hallucis longus
tendon intact Extensor digitorum longus tendon intact.

Achilles:  Intact.

Plantar Fascia: Intact.

LIGAMENTS

Lateral: Edema about the fibular attachment concerning for
ligamentous strain of anterior talofibular ligament. Calcaneofibular
ligament intact. Posterior talofibular ligament intact. Anterior and
posterior tibiofibular ligaments intact.

Medial: Deltoid ligament intact. Spring ligament intact.

CARTILAGE

Ankle Joint: Large joint effusion. Normal ankle mortise. No chondral
defect.

Subtalar Joints/Sinus Tarsi: Normal subtalar joints. No subtalar
joint effusion. Normal sinus tarsi.

Bones: Bone marrow edema of the posterior aspect of the tibia
concerning for mild displaced fracture of the posterior malleolus.
No other appreciable fracture.

Soft Tissue: No fluid collection or hematoma. Moderate subcutaneous
soft tissue edema about the medial/lateral aspect of the ankle.
Muscles are normal without edema or atrophy. Tarsal tunnel is
normal.
IMPRESSION: 1. Mildly displaced fracture of the posterior malleolus with
surrounding soft tissue edema. No other appreciable fracture.

2.  Moderate ankle joint effusion.

3. Edema about the fibular attachment of the anterior talofibular
ligament, concerning for ligamentous sprain.

4. Moderate generalized subcutaneous soft tissue edema prominent
about the medial/lateral aspect of the ankle.

## 2023-02-06 ENCOUNTER — Encounter: Payer: Self-pay | Admitting: Physician Assistant

## 2023-02-06 ENCOUNTER — Telehealth: Payer: Medicaid Other | Admitting: Physician Assistant

## 2023-02-06 DIAGNOSIS — L259 Unspecified contact dermatitis, unspecified cause: Secondary | ICD-10-CM | POA: Diagnosis not present

## 2023-02-06 MED ORDER — PREDNISONE 10 MG PO TABS: ORAL_TABLET | ORAL | 0 refills | Status: DC

## 2023-02-06 MED ORDER — TRIAMCINOLONE ACETONIDE 0.025 % EX OINT: 1.0000 | TOPICAL_OINTMENT | Freq: Two times a day (BID) | CUTANEOUS | 0 refills | Status: DC

## 2023-02-06 NOTE — Progress Notes (Signed)
Virtual Visit Consent   Erin Dennis, you are scheduled for a virtual visit with a [facility] provider today. Just as with appointments in the office, your consent must be obtained to participate. Your consent will be active for this visit and any virtual visit you may have with one of our providers in the next 365 days. If you have a MyChart account, a copy of this consent can be sent to you electronically.  As this is a virtual visit, video technology does not allow for your provider to perform a traditional examination. This may limit your provider's ability to fully assess your condition. If your provider identifies any concerns that need to be evaluated in person or the need to arrange testing (such as labs, EKG, etc.), we will make arrangements to do so. Although advances in technology are sophisticated, we cannot ensure that it will always work on either your end or our end. If the connection with a video visit is poor, the visit may have to be switched to a telephone visit. With either a video or telephone visit, we are not always able to ensure that we have a secure connection.  By engaging in this virtual visit, you consent to the provision of healthcare and authorize for your insurance to be billed (if applicable) for the services provided during this visit. Depending on your insurance coverage, you may receive a charge related to this service.  I need to obtain your verbal consent now. Are you willing to proceed with your visit today? Erin Dennis has provided verbal consent on 02/06/2023 for a virtual visit (video or telephone). [name], PA-C  Date: 02/06/2023 7:59 AM  Virtual Visit via Video Note   I, [name], connected with  Erin Dennis  ([id number], [birth year]) on 02/06/23 at  7:45 AM EDT by a video-enabled telemedicine application and verified that I am speaking with the correct person using two identifiers.  Location: Patient: Virtual Visit Location  Patient: Mobile Provider: Virtual Visit Location Provider: Home Office   I discussed the limitations of evaluation and management by telemedicine and the availability of in person appointments. The patient expressed understanding and agreed to proceed.    History of Present Illness: Erin Dennis is a 42 y.o. who identifies as a female who was assigned female at birth, and is being seen today for rash on face.  HPI: Rash This is a new problem. The current episode started in the past 7 days. The problem has been gradually worsening since onset. The affected locations include the face. The rash is characterized by itchiness, redness and blistering. It is unknown if there was an exposure to a precipitant. Past treatments include antihistamine, anti-itch cream and topical steroids (benadryl). The treatment provided no relief.     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   History of gestational diabetes 07/30/2020   Encounter for female sterilization procedure 06/03/2020   Depression 09/16/2008    Allergies:  Allergies  Allergen Reactions   Acetaminophen Anaphylaxis and Rash  5-Alpha Reductase Inhibitors     Blisters and itching   Sulfamethoxazole-Trimethoprim Other (See Comments)    Blisters and itchiness   Tape Rash    Any tape   Medications:  Current Outpatient Medications:    acitretin (SORIATANE) 10 MG capsule, Take 1 capsule (10 mg total) by mouth daily before breakfast., Disp: 90  capsule, Rfl: 3   clobetasol cream (TEMOVATE) 0.05 %, Apply 1 Application topically 2 (two) times daily., Disp: 30 g, Rfl: 0   doxycycline (VIBRA-TABS) 100 MG tablet, Take 1 tablet (100 mg total) by mouth 2 (two) times daily. (Patient not taking: Reported on 11/07/2022), Disp: 20 tablet, Rfl: 0   gabapentin (NEURONTIN) 300 MG capsule, Take 1 capsule (300 mg total) by mouth 3 (three) times daily as needed (nerve pain)., Disp: 90 capsule, Rfl: 0   lidocaine (LIDODERM) 5 %, Place 1 patch onto the skin daily. Remove & Discard patch within 12 hours or as directed by MD, Disp: 30 patch, Rfl: 0   ondansetron (ZOFRAN) 4 MG tablet, Take 1 tablet (4 mg total) by mouth every 8 (eight) hours as needed for nausea or vomiting. (Patient not taking: Reported on 11/07/2022), Disp: 20 tablet, Rfl: 0   predniSONE (DELTASONE) 10 MG tablet, Days 1-4 take 4 tablets (40 mg) daily  Days 5-8 take 3 tablets (30 mg) daily, Days 9-11 take 2 tablets (20 mg) daily, Days 12-14 take 1 tablet (10 mg) daily., Disp: 37 tablet, Rfl: 0   triamcinolone (KENALOG) 0.025 % ointment, Apply 1 Application topically 2 (two) times daily., Disp: 30 g, Rfl: 0  Observations/Objective: Patient is well-developed, well-nourished in no acute distress.  Resting comfortably Head is normocephalic, atraumatic.  No labored breathing.  Speech is clear and coherent with logical content.  Patient is alert and oriented at baseline. Erythematous, fine papular and vesicular lesions in clusters and lines on face (left temporal and left frontal forehead just over left eyebrow is worst area, then right temporal area and right frontal forehead, located on eyelids bilaterally, and under left eye)  Assessment and Plan: 1. Contact dermatitis, unspecified contact dermatitis type, unspecified trigger - predniSONE (DELTASONE) 10 MG tablet; Days 1-4 take 4 tablets (40 mg) daily  Days 5-8 take 3 tablets (30 mg) daily, Days 9-11 take 2 tablets (20 mg) daily, Days 12-14 take 1  tablet (10 mg) daily.  Dispense: 37 tablet; Refill: 0 - triamcinolone (KENALOG) 0.025 % ointment; Apply 1 Application topically 2 (two) times daily.  Dispense: 30 g; Refill: 0  - Contact dermatitis unknown trigger - Will prescribe Prednisone and Triamcinolone - May use topical Hydrocortisone cream, benadryl cream, and/or calamine lotion for itching - Cool compresses - Luke warm to cool showers - Seek in person evaluation if rash continues to spread or if any appear to become infected   Follow Up Instructions: I discussed the assessment and treatment plan with the patient. The patient was provided an opportunity to ask questions and all were answered. The patient agreed with the plan and demonstrated an understanding of the instructions.  A copy of instructions were sent to the patient via MyChart unless otherwise noted below.    The patient was advised to call back or seek an in-person evaluation if the symptoms worsen or if the condition fails to improve as anticipated.  Time:  I spent 13 minutes with the patient via telehealth technology discussing the above problems/concerns.    [name], PA-C

## 2023-02-06 NOTE — Patient Instructions (Signed)
Erin Dennis, thank you for joining [name], PA-C for today's virtual visit.  While this provider is not your primary care provider (PCP), if your PCP is located in our provider database this encounter information will be shared with them immediately following your visit.   A [facility] MyChart account gives you access to today's visit and all your visits, tests, and labs performed at [facility] " click here if you don't have a [facility] MyChart account or go to [email or web address]  Consent: (Patient) Erin Dennis provided verbal consent for this virtual visit at the beginning of the encounter.  Current Medications:  Current Outpatient Medications:    acitretin (SORIATANE) 10 MG capsule, Take 1 capsule (10 mg total) by mouth daily before breakfast., Disp: 90 capsule, Rfl: 3   clobetasol cream (TEMOVATE) 0.05 %, Apply 1 Application topically 2 (two) times daily., Disp: 30 g, Rfl: 0   doxycycline (VIBRA-TABS) 100 MG tablet, Take 1 tablet (100 mg total) by mouth 2 (two) times daily. (Patient not taking: Reported on 11/07/2022), Disp: 20 tablet, Rfl: 0   gabapentin (NEURONTIN) 300 MG capsule, Take 1 capsule (300 mg total) by mouth 3 (three) times daily as needed (nerve pain)., Disp: 90 capsule, Rfl: 0   lidocaine (LIDODERM) 5 %, Place 1 patch onto the skin daily. Remove & Discard patch within 12 hours or as directed by MD, Disp: 30 patch, Rfl: 0   ondansetron (ZOFRAN) 4 MG tablet, Take 1 tablet (4 mg total) by mouth every 8 (eight) hours as needed for nausea or vomiting. (Patient not taking: Reported on 11/07/2022), Disp: 20 tablet, Rfl: 0   predniSONE (DELTASONE) 10 MG tablet, Days 1-4 take 4 tablets (40 mg) daily  Days 5-8 take 3 tablets (30 mg) daily, Days 9-11 take 2 tablets (20 mg) daily, Days 12-14 take 1 tablet (10 mg) daily., Disp: 37 tablet, Rfl: 0   triamcinolone (KENALOG) 0.025 % ointment, Apply 1 Application topically 2 (two) times daily., Disp: 30 g, Rfl:  0   Medications ordered in this encounter:  Meds ordered this encounter  Medications   DISCONTD: predniSONE (DELTASONE) 10 MG tablet    Sig: Days 1-4 take 4 tablets (40 mg) daily  Days 5-8 take 3 tablets (30 mg) daily, Days 9-11 take 2 tablets (20 mg) daily, Days 12-14 take 1 tablet (10 mg) daily.    Dispense:  37 tablet    Refill:  0    Order Specific Question:   Supervising Provider    Answer:   Merrilee Jansky [id number]   DISCONTD: triamcinolone (KENALOG) 0.025 % ointment    Sig: Apply 1 Application topically 2 (two) times daily.    Dispense:  30 g    Refill:  0    Order Specific Question:   Supervising Provider    Answer:   Merrilee Jansky [[id number]]   predniSONE (DELTASONE) 10 MG tablet    Sig: Days 1-4 take 4 tablets (40 mg) daily  Days 5-8 take 3 tablets (30 mg) daily, Days 9-11 take 2 tablets (20 mg) daily, Days 12-14 take 1 tablet (10 mg) daily.    Dispense:  37 tablet    Refill:  0    Order Specific Question:   Supervising Provider    Answer:   Merrilee Jansky [id number]   triamcinolone (KENALOG) 0.025 % ointment    Sig: Apply 1 Application topically 2 (two) times daily.    Dispense:  30 g    Refill:  0  Order Specific Question:   Supervising Provider    Answer:   Merrilee Jansky [[id number]]     *If you need refills on other medications prior to your next appointment, please contact your pharmacy*  Follow-Up: Call back or seek an in-person evaluation if the symptoms worsen or if the condition fails to improve as anticipated.  [facility] [phone]  Other Instructions Contact Dermatitis Dermatitis is redness, soreness, and swelling (inflammation) of the skin. Contact dermatitis is a reaction to certain substances that touch the skin. There are two types of this condition: Irritant contact dermatitis. This is the most common type. It happens when something irritates your skin, such as when your hands get dry from washing them too often with  soap. You can get this type of reaction even if you have not been exposed to the irritant before. Allergic contact dermatitis. This type is caused by a substance that you are allergic to, such as poison ivy. It occurs when you have been exposed to the substance (allergen) and form a sensitivity to it. In some cases, the reaction may start soon after your first exposure to the allergen. In other cases, it may not start until you are exposed to the allergen again. It may then occur every time you are exposed to the allergen in the future. What are the causes? Irritant contact dermatitis is often caused by exposure to: Makeup. Soaps, detergents, and bleaches. Acids. Metal salts, such as nickel. Allergic contact dermatitis is often caused by exposure to: Poisonous plants. Chemicals. Jewelry. Latex. Medicines. Preservatives in products, such as clothes. What increases the risk? You are more likely to get this condition if you have: A job that exposes you to irritants or allergens. Certain medical conditions. These include asthma and eczema. What are the signs or symptoms? Symptoms of this condition may occur in any place on your body that has been touched by the irritant. Symptoms include: Dryness, flaking, or cracking. Redness. Itching. Pain or a burning feeling. Blisters. Drainage of small amounts of blood or clear fluid from skin cracks. With allergic contact dermatitis, there may also be swelling in areas such as the eyelids, mouth, or genitals. How is this diagnosed? This condition is diagnosed with a medical history and physical exam. A patch skin test may be done to help figure out the cause. If the condition is related to your job, you may need to see an expert in health problems in the workplace (occupational medicine specialist). How is this treated? This condition is treated by staying away from the cause of the reaction and protecting your skin from further contact. Treatment  may also include: Steroid creams or ointments. Steroid medicines may need be taken by mouth (orally) in more severe cases. Antibiotics or medicines applied to the skin to kill bacteria (antibacterial ointments). These may be needed if a skin infection is present. Antihistamines. These may be taken orally or put on as a lotion to ease itching. A bandage (dressing). Follow these instructions at home: Skin care Moisturize your skin as needed. Put cool, wet cloths (cool compresses) on the affected areas. Try applying baking soda paste to your skin. Stir water into baking soda until it has the consistency of a paste. Do not scratch your skin. Avoid friction to the affected area. Avoid the use of soaps, perfumes, and dyes. Check the affected areas every day for signs of infection. Check for: More redness, swelling, or pain. More fluid or blood. Warmth. Pus or  a bad smell. Medicines Take or apply over-the-counter and prescription medicines only as told by your health care provider. If you were prescribed antibiotics, take or apply them as told by your health care provider. Do not stop using the antibiotic even if you start to feel better. Bathing Try taking a bath with: Epsom salts. Follow the instructions on the packaging. You can get these at your local pharmacy or grocery store. Baking soda. Pour a small amount into the bath as told by your health care provider. Colloidal oatmeal. Follow the instructions on the packaging. You can get this at your local pharmacy or grocery store. Bathe less often. This may mean bathing every other day. Bathe in lukewarm water. Avoid using hot water. Bandage care If you were given a dressing, change it as told by your health care provider. Wash your hands with soap and water for at least 20 seconds before and after you change your dressing. If soap and water are not available, use hand sanitizer. General instructions Avoid the substance that caused your  reaction. If you do not know what caused it, keep a journal to try to track what caused it. Write down: What you eat and drink. What cosmetics you use. What you wear in the affected area. This includes jewelry. Contact a health care provider if: Your condition does not get better with treatment. Your condition gets worse. You have any signs of infection. You have a fever. You have new symptoms. Your bone or joint under the affected area becomes painful after the skin has healed. Get help right away if: You notice red streaks coming from the affected area. The affected area turns darker. You have trouble breathing. This information is not intended to replace advice given to you by your health care provider. Make sure you discuss any questions you have with your health care provider. Document Revised: 01/21/2022 Document Reviewed: 01/21/2022 Elsevier Patient Education  2024 Elsevier Inc.    If you have been instructed to have an in-person evaluation today at a local Urgent Care facility, please use the link below. It will take you to a list of all of our available [facility], including address, phone number and hours of operation. Please do not delay care.  [facility]  If you or a family member do not have a primary care provider, use the link below to schedule a visit and establish care. When you choose a [facility] primary care physician or advanced practice provider, you gain a long-term partner in health. Find a Primary Care Provider  Learn more about [facility]'s in-office and virtual care options: [facility] - Get Care Now

## 2023-02-14 NOTE — Progress Notes (Signed)
DOS 05/06/2022  Lapidus bunionectomy left foot, bone graft from heel, bone cut in big toe

## 2023-03-08 ENCOUNTER — Ambulatory Visit: Payer: Medicaid Other | Admitting: Podiatry

## 2023-03-21 ENCOUNTER — Encounter: Payer: Self-pay | Admitting: Podiatry

## 2023-03-22 MED ORDER — GABAPENTIN 300 MG PO CAPS: 300.0000 mg | ORAL_CAPSULE | Freq: Three times a day (TID) | ORAL | 0 refills | Status: DC | PRN

## 2023-04-07 ENCOUNTER — Ambulatory Visit (INDEPENDENT_AMBULATORY_CARE_PROVIDER_SITE_OTHER): Payer: Medicaid Other

## 2023-04-07 ENCOUNTER — Ambulatory Visit: Payer: Medicaid Other | Admitting: Podiatry

## 2023-04-07 DIAGNOSIS — M2011 Hallux valgus (acquired), right foot: Secondary | ICD-10-CM | POA: Diagnosis not present

## 2023-04-07 DIAGNOSIS — M2012 Hallux valgus (acquired), left foot: Secondary | ICD-10-CM | POA: Diagnosis not present

## 2023-04-07 DIAGNOSIS — T85848A Pain due to other internal prosthetic devices, implants and grafts, initial encounter: Secondary | ICD-10-CM

## 2023-04-07 MED ORDER — GABAPENTIN 300 MG PO CAPS: 300.0000 mg | ORAL_CAPSULE | Freq: Three times a day (TID) | ORAL | 0 refills | Status: DC | PRN

## 2023-04-07 NOTE — Progress Notes (Signed)
Subjective:  Patient ID: Erin Dennis, female    DOB: [birth year],  MRN: [id number]  Chief Complaint  Patient presents with   Foot Pain    42 y.o. female presents with the above complaint.  Patient presents with complaint of left painful orthopedic hardware with a history of Lapidus bunionectomy.  Patient states is painful to touch gives her shooting pain.  She has not seen anyone as prior to seeing me denies any other acute complaints.  She is known to Dr. Lilian Kapur who did the surgery in February.   Review of Systems: Negative except as noted in the HPI. Denies N/V/F/Ch.  Past Medical History:  Diagnosis Date   Anxiety    Darier disease    Follows with Dr. 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   Febrile seizure (HCC)    as a baby, no epilepsy per pt   Hematoma 10/15/2020   perineum after childbirth   Infection    UTI   Ovarian cyst    x 3   Personal history of gestational diabetes 2022   Uterovaginal prolapse, incomplete 2022    Current Outpatient Medications:    acitretin (SORIATANE) 10 MG capsule, Take 1 capsule (10 mg total) by mouth daily before breakfast., Disp: 90 capsule, Rfl: 3   clobetasol cream (TEMOVATE) 0.05 %, Apply 1 Application topically 2 (two) times daily., Disp: 30 g, Rfl: 0   doxycycline (VIBRA-TABS) 100 MG tablet, Take 1 tablet (100 mg total) by mouth 2 (two) times daily. (Patient not taking: Reported on 11/07/2022), Disp: 20 tablet, Rfl: 0   gabapentin (NEURONTIN) 300 MG capsule, Take 1 capsule (300 mg total) by mouth 3 (three) times daily as needed (nerve pain)., Disp: 90 capsule, Rfl: 0   lidocaine (LIDODERM) 5 %, Place 1 patch onto the skin daily. Remove & Discard patch within 12 hours or as directed by MD, Disp: 30 patch, Rfl: 0   ondansetron (ZOFRAN) 4 MG tablet, Take 1 tablet (4 mg total) by mouth every 8  (eight) hours as needed for nausea or vomiting. (Patient not taking: Reported on 11/07/2022), Disp: 20 tablet, Rfl: 0   predniSONE (DELTASONE) 10 MG tablet, Days 1-4 take 4 tablets (40 mg) daily  Days 5-8 take 3 tablets (30 mg) daily, Days 9-11 take 2 tablets (20 mg) daily, Days 12-14 take 1 tablet (10 mg) daily., Disp: 37 tablet, Rfl: 0   triamcinolone (KENALOG) 0.025 % ointment, Apply 1 Application topically 2 (two) times daily., Disp: 30 g, Rfl: 0  Social History   Tobacco Use  Smoking Status Never  Smokeless Tobacco Never    Allergies  Allergen Reactions   Acetaminophen Anaphylaxis and Rash   5-Alpha Reductase Inhibitors     Blisters and itching   Sulfamethoxazole-Trimethoprim Other (See Comments)    Blisters and itchiness   Tape Rash    Any tape   Objective:  There were no vitals filed for this visit. There is no height or weight on file to calculate BMI. Constitutional Well developed. Well nourished.  Vascular Dorsalis pedis pulses palpable bilaterally. Posterior tibial pulses palpable bilaterally. Capillary refill normal to all digits.  No cyanosis or clubbing noted. Pedal hair growth normal.  Neurologic Normal speech. Oriented to person, place, and time. Epicritic sensation to light touch grossly present bilaterally.  Dermatologic Nails well groomed and normal in appearance. No open wounds.  No skin lesions.  Orthopedic: Pain on palpation along the course of the Lapidus plate and hardware.  Some pain at the first metatarsophalangeal joint.  Good range of motion noted first MPJ.   Radiographs: 3 views of skeletally mature adult left foot: 3 views of skeletally mature adult left foot: Hardware appears to be intact no signs of backing out or loosening noted.  Reduction of bunion deformity noted. Assessment:   1. Acquired hallux interphalangeus, right   2. Pain from implanted hardware, initial encounter    Plan:  Patient was evaluated and treated and all questions  answered.  Left painful orthopedic hardware -All questions and concerns were discussed with the patient in extensive detail given the amount of pain that she is having she will benefit from CT scan to assess for fusion at the first tarsometatarsal joint.  She will benefit from follow-up with Dr. Lilian Kapur and possible removal of the fusion if the CT scan is confirmed for arthrodesis.  I discussed with this with the patient in extensive detail she states understanding -Gabapentin was dispensed for pain control/nerve pain  No follow-ups on file.   Left painful orthopedic hardware CT scan to assess for fusion will see Dr. Lilian Kapur for follow-up and possible removal

## 2023-04-10 ENCOUNTER — Ambulatory Visit (INDEPENDENT_AMBULATORY_CARE_PROVIDER_SITE_OTHER): Payer: Medicaid Other | Admitting: Podiatry

## 2023-04-10 DIAGNOSIS — Z91199 Patient's noncompliance with other medical treatment and regimen due to unspecified reason: Secondary | ICD-10-CM

## 2023-04-10 DIAGNOSIS — T85848A Pain due to other internal prosthetic devices, implants and grafts, initial encounter: Secondary | ICD-10-CM

## 2023-04-10 NOTE — Progress Notes (Signed)
Patient was no-show for appointment today 

## 2023-04-11 ENCOUNTER — Ambulatory Visit
Admission: RE | Admit: 2023-04-11 | Discharge: 2023-04-11 | Disposition: A | Discharge status: Home / Self Care | Payer: Medicaid Other | Source: Ambulatory Visit | Attending: Podiatry | Admitting: Podiatry

## 2023-04-11 DIAGNOSIS — T85848A Pain due to other internal prosthetic devices, implants and grafts, initial encounter: Secondary | ICD-10-CM

## 2023-04-20 MED ORDER — GABAPENTIN 300 MG PO CAPS: 300.0000 mg | ORAL_CAPSULE | Freq: Three times a day (TID) | ORAL | 0 refills | Status: DC

## 2023-04-20 NOTE — Addendum Note (Signed)
Addended byLilian Kapur, Ura Yingling R on: 04/20/2023 05:25 PM   Modules accepted: Orders

## 2023-04-21 ENCOUNTER — Other Ambulatory Visit: Payer: Self-pay | Admitting: Podiatry

## 2023-05-08 ENCOUNTER — Ambulatory Visit: Payer: Medicaid Other | Admitting: Podiatry

## 2023-05-09 ENCOUNTER — Ambulatory Visit: Payer: Medicaid Other | Admitting: Podiatry

## 2023-05-16 ENCOUNTER — Ambulatory Visit: Payer: Medicaid Other | Admitting: Podiatry

## 2023-05-16 ENCOUNTER — Ambulatory Visit (INDEPENDENT_AMBULATORY_CARE_PROVIDER_SITE_OTHER): Payer: Medicaid Other | Admitting: Podiatry

## 2023-05-16 ENCOUNTER — Encounter: Payer: Self-pay | Admitting: Podiatry

## 2023-05-16 VITALS — Ht 61.0 in | Wt 145.0 lb

## 2023-05-16 DIAGNOSIS — T85848D Pain due to other internal prosthetic devices, implants and grafts, subsequent encounter: Secondary | ICD-10-CM

## 2023-05-16 DIAGNOSIS — M2011 Hallux valgus (acquired), right foot: Secondary | ICD-10-CM | POA: Diagnosis not present

## 2023-05-16 DIAGNOSIS — M2012 Hallux valgus (acquired), left foot: Secondary | ICD-10-CM | POA: Diagnosis not present

## 2023-05-16 NOTE — Progress Notes (Signed)
Subjective:  Patient ID: Erin Dennis, female    DOB: [birth year],  MRN: [id number]  Chief Complaint  Patient presents with   Foot Pain    Patient is here to discuss CT results    42 y.o. female presents with the above complaint. History confirmed with patient.  She returns for follow-up her right foot is doing well her left foot has burning and tingling over the plates from the dorsal incision, she feels that the big toe is still drifting over  Objective:  Physical Exam: warm, good capillary refill, no trophic changes or ulcerative lesions, normal DP and PT pulses, normal sensory exam, and well-healed surgical scar that has tenderness over the plates dorsal left midfoot, she has residual hallux abductus deformity   Radiographs: Multiple views x-ray of the left foot: Last radiographs taken 04/07/2023 show residual hallux valgus deformity with increased PASA, IM angle is parallel, some hallux interphalangeus deformity noted as well  CT scan shows healed fusion site first TMT  Assessment:   1. Hallux abducto valgus, left   2. Pain from implanted hardware, subsequent encounter   3. Acquired hallux interphalangeus, right      Plan:  Patient was evaluated and treated and all questions answered.  Reviewed her most recent x-rays as well as the results of the CT scan with her we discussed the presence of the full fusion and that we can remove the hardware which I think is causing most of the pain over the dorsal midfoot.  We also discussed that she does still have a residual hallux abductus deformity that is present at the level of the MTP.  We addressed this on her right foot with a Reverdin osteotomy at the same time as her Lapidus and I think this likely would address the deformity here as well.  We discussed the possibility of need for an Akin as well.  We discussed the recovery time and the need for the cam walker boot immobilization for 6 to 8 weeks.  She understands and wishes to  proceed.  All questions addressed.  Informed consent signed and reviewed.   Surgical plan:  Procedure: -Left foot removal of plates and screws at first TMT, Reverdin osteotomy, possible Akin osteotomy  Location: -[facility]  Anesthesia plan: -IV sedation with regional block  Postoperative pain plan: - Tylenol 1000 mg every 6 hours, ibuprofen 600 mg every 6 hours, gabapentin 300 mg every 8 hours x5 days, oxycodone 5 mg 1-2 tabs every 6 hours only as needed  DVT prophylaxis: -None required  WB Restrictions / DME needs: -WBAT in CAM boot to heel only  No follow-ups on file.

## 2023-05-28 ENCOUNTER — Other Ambulatory Visit: Payer: Self-pay | Admitting: Podiatry

## 2023-06-23 ENCOUNTER — Telehealth: Payer: Self-pay | Admitting: Podiatry

## 2023-06-23 NOTE — Telephone Encounter (Signed)
Pt called stating that her surgery had to be rescheduled due to Dr.McDonald no longer doing surgery on her original surgery date and has yet to receive a call with a new date or options when the surgery could be rescheduled. The best number to reach her is [phone].

## 2023-06-27 ENCOUNTER — Encounter: Payer: Self-pay | Admitting: Podiatry

## 2023-08-24 ENCOUNTER — Other Ambulatory Visit: Payer: Self-pay | Admitting: Podiatry

## 2023-08-28 MED ORDER — GABAPENTIN 300 MG PO CAPS: 300.0000 mg | ORAL_CAPSULE | Freq: Three times a day (TID) | ORAL | 0 refills | Status: DC

## 2023-08-30 ENCOUNTER — Telehealth: Payer: Self-pay | Admitting: Podiatry

## 2023-08-30 NOTE — Telephone Encounter (Signed)
DOS-09/22/23  Serafina Royals [id number] REMOVAL FIXATION DEEP KWIRE/SCREW [id number]  [facility] EFFECTIVE DATE- 08/02/23  DEDUCTIBLE- $0.00 WITH REMAINING $0.00 OOP- Member's individual out-of-pocket maximum has no limit. COINSURANCE- 0%  PER THE [employer] PORTAL, PRIOR AUTH HAS BEEN APPROVED FOR CPT CODES 45409 AND 20680. GOOD FROM 09/22/23 - 12/21/50.  AUTH REFERENCE NUMBER: [id number]

## 2023-09-05 ENCOUNTER — Telehealth: Payer: 59 | Admitting: Physician Assistant

## 2023-09-05 ENCOUNTER — Telehealth: Payer: Medicaid Other | Admitting: Nurse Practitioner

## 2023-09-05 DIAGNOSIS — Z91199 Patient's noncompliance with other medical treatment and regimen due to unspecified reason: Secondary | ICD-10-CM

## 2023-09-05 DIAGNOSIS — R6889 Other general symptoms and signs: Secondary | ICD-10-CM | POA: Diagnosis not present

## 2023-09-05 MED ORDER — OSELTAMIVIR PHOSPHATE 75 MG PO CAPS: 75.0000 mg | ORAL_CAPSULE | Freq: Two times a day (BID) | ORAL | 0 refills | Status: AC

## 2023-09-05 NOTE — Progress Notes (Signed)

## 2023-09-05 NOTE — Progress Notes (Signed)
 The patient no-showed for appointment despite this provider sending direct link with no response and waiting for at least 10 minutes from appointment time for patient to join. They will be marked as a NS for this appointment/time.   Piedad Climes, PA-C

## 2023-09-06 ENCOUNTER — Telehealth: Payer: 59 | Admitting: Physician Assistant

## 2023-09-06 DIAGNOSIS — R051 Acute cough: Secondary | ICD-10-CM

## 2023-09-06 MED ORDER — ALBUTEROL SULFATE HFA 108 (90 BASE) MCG/ACT IN AERS: 1.0000 | INHALATION_SPRAY | Freq: Four times a day (QID) | RESPIRATORY_TRACT | 0 refills | Status: DC | PRN

## 2023-09-06 MED ORDER — BENZONATATE 100 MG PO CAPS: 100.0000 mg | ORAL_CAPSULE | Freq: Three times a day (TID) | ORAL | 0 refills | Status: DC | PRN

## 2023-09-06 NOTE — Progress Notes (Signed)
 E-Visit for Cough   We are sorry that you are not feeling well.  Here is how we plan to help!  Based on your presentation I believe you most likely have an acute cough in the setting of influenza.    In addition you may use A non-prescription cough medication called Mucinex DM: take 2 tablets every 12 hours. and A prescription cough medication called Tessalon  Perles 100mg . You may take 1-2 capsules every 8 hours as needed for your cough.  I will also prescribe: Albuterol  inhaler Use 1-2 puffs every 6 hours as needed for shortness of breath, chest tightness, and/or wheezing.  From your responses in the eVisit questionnaire you describe inflammation in the upper respiratory tract which is causing a significant cough.  This is commonly called Bronchitis and has four common causes:   Allergies Viral Infections Acid Reflux Bacterial Infection Allergies, viruses and acid reflux are treated by controlling symptoms or eliminating the cause. An example might be a cough caused by taking certain blood pressure medications. You stop the cough by changing the medication. Another example might be a cough caused by acid reflux. Controlling the reflux helps control the cough.  USE OF BRONCHODILATOR (RESCUE) INHALERS: There is a risk from using your bronchodilator too frequently.  The risk is that over-reliance on a medication which only relaxes the muscles surrounding the breathing tubes can reduce the effectiveness of medications prescribed to reduce swelling and congestion of the tubes themselves.  Although you feel brief relief from the bronchodilator inhaler, your asthma may actually be worsening with the tubes becoming more swollen and filled with mucus.  This can delay other crucial treatments, such as oral steroid medications. If you need to use a bronchodilator inhaler daily, several times per day, you should discuss this with your provider.  There are probably better treatments that could be used to  keep your asthma under control.     HOME CARE Only take medications as instructed by your medical team. Complete the entire course of an antibiotic. Drink plenty of fluids and get plenty of rest. Avoid close contacts especially the very young and the elderly Cover your mouth if you cough or cough into your sleeve. Always remember to wash your hands A steam or ultrasonic humidifier can help congestion.   GET HELP RIGHT AWAY IF: You develop worsening fever. You become short of breath You cough up blood. Your symptoms persist after you have completed your treatment plan MAKE SURE YOU  Understand these instructions. Will watch your condition. Will get help right away if you are not doing well or get worse.    Thank you for choosing an e-visit.  Your e-visit answers were reviewed by a board certified advanced clinical practitioner to complete your personal care plan. Depending upon the condition, your plan could have included both over the counter or prescription medications.  Please review your pharmacy choice. Make sure the pharmacy is open so you can pick up prescription now. If there is a problem, you may contact your provider through [employer] and have the prescription routed to another pharmacy.  Your safety is important to us . If you have drug allergies check your prescription carefully.   For the next 24 hours you can use MyChart to ask questions about today's visit, request a non-urgent call back, or ask for a work or school excuse. You will get an email in the next two days asking about your experience. I hope that your e-visit has been valuable and  will speed your recovery.   I have spent 5 minutes in review of e-visit questionnaire, review and updating patient chart, medical decision making and response to patient.   Delon CHRISTELLA Dickinson, PA-C

## 2023-09-14 ENCOUNTER — Encounter
Admission: RE | Admit: 2023-09-14 | Discharge: 2023-09-14 | Disposition: A | Discharge status: Home / Self Care | Payer: Medicaid Other | Source: Ambulatory Visit | Attending: Podiatry | Admitting: Podiatry

## 2023-09-14 ENCOUNTER — Other Ambulatory Visit: Payer: Self-pay

## 2023-09-14 NOTE — Patient Instructions (Addendum)
Your procedure is scheduled on: 09/22/23  Report to the Registration Desk on the 1st floor of the [facility]. To find out your arrival time, please call [phone] between 1PM - 3PM on: 09/21/23 If your arrival time is 6:00 am, do not arrive before that time as the [facility] entrance doors do not open until 6:00 am.  REMEMBER: Instructions that are not followed completely may result in serious medical risk, up to and including death; or upon the discretion of your surgeon and anesthesiologist your surgery may need to be rescheduled.  Do not eat food after midnight the night before surgery.  No gum chewing or hard candies.  You may however, drink CLEAR liquids up to 2 hours before you are scheduled to arrive for your surgery. Do not drink anything within 2 hours of your scheduled arrival time.  Clear liquids include: - water  - apple juice without pulp - gatorade (not RED colors) - black coffee or tea (Do NOT add milk or creamers to the coffee or tea) Do NOT drink anything that is not on this list.  In addition, your doctor has ordered for you to drink the provided:  Ensure Pre-Surgery Clear Carbohydrate Drink  Drinking this carbohydrate drink up to two hours before surgery helps to reduce insulin resistance and improve patient outcomes. Please complete drinking 2 hours before scheduled arrival time.  One week prior to surgery: Stop Anti-inflammatories (NSAIDS) such as Advil, Aleve, Ibuprofen, Motrin, Naproxen, Naprosyn and Aspirin based products such as Excedrin, Goody's Powder, BC Powder.  Stop ANY OVER THE COUNTER supplements until after surgery.  ON THE DAY OF SURGERY ONLY TAKE THESE MEDICATIONS WITH SIPS OF WATER:  gabapentin (NEURONTIN)  acitretin (SORIATANE)    No Alcohol for 24 hours before or after surgery.  No Smoking including e-cigarettes for 24 hours before surgery.  No chewable tobacco products for at least 6 hours before surgery.  No nicotine patches on  the day of surgery.  Do not use any "recreational" drugs for at least a week (preferably 2 weeks) before your surgery.  Please be advised that the combination of cocaine and anesthesia may have negative outcomes, up to and including death. If you test positive for cocaine, your surgery will be cancelled.  On the morning of surgery brush your teeth with toothpaste and water, you may rinse your mouth with mouthwash if you wish. Do not swallow any toothpaste or mouthwash.  Use CHG Soap or wipes as directed on instruction sheet.  Do not wear jewelry, make-up, hairpins, clips or nail polish.  For welded (permanent) jewelry: bracelets, anklets, waist bands, etc.  Please have this removed prior to surgery.  If it is not removed, there is a chance that hospital personnel will need to cut it off on the day of surgery.  Do not wear lotions, powders, or perfumes.   Do not shave body hair from the neck down 48 hours before surgery.  Contact lenses, hearing aids and dentures may not be worn into surgery.  Do not bring valuables to the hospital. [facility] is not responsible for any missing/lost belongings or valuables.   Notify your doctor if there is any change in your medical condition (cold, fever, infection).  Wear comfortable clothing (specific to your surgery type) to the hospital.  After surgery, you can help prevent lung complications by doing breathing exercises.  Take deep breaths and cough every 1-2 hours. Your doctor may order a device called an Incentive Spirometer to help  you take deep breaths. When coughing or sneezing, hold a pillow firmly against your incision with both hands. This is called "splinting." Doing this helps protect your incision. It also decreases belly discomfort.  If you are being admitted to the hospital overnight, leave your suitcase in the car. After surgery it may be brought to your room.  In case of increased patient census, it may be necessary for you, the  patient, to continue your postoperative care in the Same Day Surgery department.  If you are being discharged the day of surgery, you will not be allowed to drive home. You will need a responsible individual to drive you home and stay with you for 24 hours after surgery.   If you are taking public transportation, you will need to have a responsible individual with you.  Please call the Pre-admissions Testing Dept. at [phone] if you have any questions about these instructions.  Surgery Visitation Policy:  Patients having surgery or a procedure may have two visitors.  Children under the age of 79 must have an adult with them who is not the patient.  Temporary Visitor Restrictions Due to increasing cases of flu, RSV and COVID-19: Children ages 21 and under will not be able to visit patients in [facility] hospitals under most circumstances.  Inpatient Visitation:    Visiting hours are 7 a.m. to 8 p.m. Up to four visitors are allowed at one time in a patient room. The visitors may rotate out with other people during the day.  One visitor age 33 or older may stay with the patient overnight and must be in the room by 8 p.m.     Preparing for Surgery with CHLORHEXIDINE GLUCONATE (CHG) Soap  Chlorhexidine Gluconate (CHG) Soap  o An antiseptic cleaner that kills germs and bonds with the skin to continue killing germs even after washing  o Used for showering the night before surgery and morning of surgery  Before surgery, you can play an important role by reducing the number of germs on your skin.  CHG (Chlorhexidine gluconate) soap is an antiseptic cleanser which kills germs and bonds with the skin to continue killing germs even after washing.  Please do not use if you have an allergy to CHG or antibacterial soaps. If your skin becomes reddened/irritated stop using the CHG.  1. Shower the NIGHT BEFORE SURGERY and the MORNING OF SURGERY with CHG soap.  2. If you choose to wash  your hair, wash your hair first as usual with your normal shampoo.  3. After shampooing, rinse your hair and body thoroughly to remove the shampoo.  4. Use CHG as you would any other liquid soap. You can apply CHG directly to the skin and wash gently with a scrungie or a clean washcloth.  5. Apply the CHG soap to your body only from the neck down. Do not use on open wounds or open sores. Avoid contact with your eyes, ears, mouth, and genitals (private parts). Wash face and genitals (private parts) with your normal soap.  6. Wash thoroughly, paying special attention to the area where your surgery will be performed.  7. Thoroughly rinse your body with warm water.  8. Do not shower/wash with your normal soap after using and rinsing off the CHG soap.  9. Pat yourself dry with a clean towel.  10. Wear clean pajamas to bed the night before surgery.  12. Place clean sheets on your bed the night of your first shower and do not  sleep with pets.  13. Shower again with the CHG soap on the day of surgery prior to arriving at the hospital.  14. Do not apply any deodorants/lotions/powders.  15. Please wear clean clothes to the hospital.

## 2023-09-22 ENCOUNTER — Encounter: Payer: Self-pay | Admitting: Podiatry

## 2023-09-22 ENCOUNTER — Ambulatory Visit: Payer: Medicaid Other

## 2023-09-22 ENCOUNTER — Encounter
Admission: RE | Disposition: A | Discharge status: Home / Self Care | Payer: Self-pay | Source: Ambulatory Visit | Attending: Podiatry

## 2023-09-22 ENCOUNTER — Ambulatory Visit: Payer: Medicaid Other | Admitting: Anesthesiology

## 2023-09-22 ENCOUNTER — Other Ambulatory Visit: Payer: Self-pay

## 2023-09-22 ENCOUNTER — Ambulatory Visit
Admission: RE | Admit: 2023-09-22 | Discharge: 2023-09-22 | Disposition: A | Discharge status: Home / Self Care | Payer: Medicaid Other | Source: Ambulatory Visit | Attending: Podiatry | Admitting: Podiatry

## 2023-09-22 DIAGNOSIS — T8484XA Pain due to internal orthopedic prosthetic devices, implants and grafts, initial encounter: Secondary | ICD-10-CM | POA: Diagnosis not present

## 2023-09-22 DIAGNOSIS — M2012 Hallux valgus (acquired), left foot: Secondary | ICD-10-CM

## 2023-09-22 DIAGNOSIS — M2011 Hallux valgus (acquired), right foot: Secondary | ICD-10-CM | POA: Diagnosis not present

## 2023-09-22 DIAGNOSIS — Y798 Miscellaneous orthopedic devices associated with adverse incidents, not elsewhere classified: Secondary | ICD-10-CM | POA: Insufficient documentation

## 2023-09-22 DIAGNOSIS — M21612 Bunion of left foot: Secondary | ICD-10-CM | POA: Insufficient documentation

## 2023-09-22 HISTORY — PX: BUNIONECTOMY: SHX129

## 2023-09-22 HISTORY — PX: HARDWARE REMOVAL: SHX979

## 2023-09-22 SURGERY — BUNIONECTOMY
Anesthesia: General | Site: Toe | Laterality: Left

## 2023-09-22 MED ORDER — BUPIVACAINE HCL (PF) 0.5 % IJ SOLN
INTRAMUSCULAR | Status: AC
  Filled 2023-09-22: qty 20

## 2023-09-22 MED ORDER — CEFAZOLIN SODIUM-DEXTROSE 2-4 GM/100ML-% IV SOLN
2.0000 g | INTRAVENOUS | Status: AC
  Administered 2023-09-22: 2 g via INTRAVENOUS

## 2023-09-22 MED ORDER — DEXAMETHASONE SODIUM PHOSPHATE 10 MG/ML IJ SOLN
INTRAMUSCULAR | Status: DC | PRN
  Administered 2023-09-22: 10 mg via INTRAVENOUS

## 2023-09-22 MED ORDER — ORAL CARE MOUTH RINSE: 15.0000 mL | Freq: Once | OROMUCOSAL | Status: DC

## 2023-09-22 MED ORDER — ONDANSETRON HCL 4 MG/2ML IJ SOLN
INTRAMUSCULAR | Status: DC | PRN
  Administered 2023-09-22: 4 mg via INTRAVENOUS

## 2023-09-22 MED ORDER — MIDAZOLAM HCL 2 MG/2ML IJ SOLN
INTRAMUSCULAR | Status: DC | PRN
  Administered 2023-09-22: 2 mg via INTRAVENOUS

## 2023-09-22 MED ORDER — LACTATED RINGERS IV SOLN: INTRAVENOUS | Status: DC

## 2023-09-22 MED ORDER — FENTANYL CITRATE (PF) 100 MCG/2ML IJ SOLN
INTRAMUSCULAR | Status: DC | PRN
  Administered 2023-09-22 (×2): 25 ug via INTRAVENOUS

## 2023-09-22 MED ORDER — PROPOFOL 10 MG/ML IV BOLUS
INTRAVENOUS | Status: DC | PRN
  Administered 2023-09-22: 50 mg via INTRAVENOUS
  Administered 2023-09-22: 150 mg via INTRAVENOUS
  Administered 2023-09-22: 40 mg via INTRAVENOUS

## 2023-09-22 MED ORDER — BUPIVACAINE LIPOSOME 1.3 % IJ SUSP
INTRAMUSCULAR | Status: AC
  Filled 2023-09-22: qty 20

## 2023-09-22 MED ORDER — LIDOCAINE HCL (CARDIAC) PF 100 MG/5ML IV SOSY
PREFILLED_SYRINGE | INTRAVENOUS | Status: DC | PRN
  Administered 2023-09-22: 20 mg via INTRAVENOUS

## 2023-09-22 MED ORDER — FENTANYL CITRATE PF 50 MCG/ML IJ SOSY
25.0000 ug | PREFILLED_SYRINGE | Freq: Once | INTRAMUSCULAR | Status: AC
  Administered 2023-09-22: 25 ug via INTRAVENOUS

## 2023-09-22 MED ORDER — IBUPROFEN 600 MG PO TABS: 600.0000 mg | ORAL_TABLET | Freq: Four times a day (QID) | ORAL | 0 refills | Status: AC | PRN

## 2023-09-22 MED ORDER — DIPHENHYDRAMINE HCL 50 MG/ML IJ SOLN
INTRAMUSCULAR | Status: DC | PRN
  Administered 2023-09-22: 25 mg via INTRAVENOUS

## 2023-09-22 MED ORDER — FENTANYL CITRATE PF 50 MCG/ML IJ SOSY
PREFILLED_SYRINGE | INTRAMUSCULAR | Status: AC
  Filled 2023-09-22: qty 1

## 2023-09-22 MED ORDER — FENTANYL CITRATE (PF) 100 MCG/2ML IJ SOLN
INTRAMUSCULAR | Status: AC
  Filled 2023-09-22: qty 2

## 2023-09-22 MED ORDER — MIDAZOLAM HCL 2 MG/2ML IJ SOLN
INTRAMUSCULAR | Status: AC
  Filled 2023-09-22: qty 2

## 2023-09-22 MED ORDER — BUPIVACAINE HCL (PF) 0.5 % IJ SOLN
INTRAMUSCULAR | Status: DC | PRN
  Administered 2023-09-22 (×2): 10 mL via PERINEURAL

## 2023-09-22 MED ORDER — LIDOCAINE HCL (PF) 1 % IJ SOLN
INTRAMUSCULAR | Status: DC | PRN
  Administered 2023-09-22 (×2): 2.5 mL via SUBCUTANEOUS

## 2023-09-22 MED ORDER — OXYCODONE HCL 5 MG PO TABS: 5.0000 mg | ORAL_TABLET | ORAL | 0 refills | Status: DC | PRN

## 2023-09-22 MED ORDER — BUPIVACAINE LIPOSOME 1.3 % IJ SUSP
INTRAMUSCULAR | Status: DC | PRN
  Administered 2023-09-22 (×2): 10 mL via PERINEURAL

## 2023-09-22 MED ORDER — CHLORHEXIDINE GLUCONATE 0.12 % MT SOLN
15.0000 mL | Freq: Once | OROMUCOSAL | Status: DC
Start: 2023-09-22 — End: 2023-09-22

## 2023-09-22 MED ORDER — SODIUM CHLORIDE 0.9 % IR SOLN
Status: DC | PRN
  Administered 2023-09-22: 500 mL

## 2023-09-22 MED ORDER — CHLORHEXIDINE GLUCONATE 0.12 % MT SOLN
OROMUCOSAL | Status: AC
  Filled 2023-09-22: qty 15

## 2023-09-22 MED ORDER — CEFAZOLIN SODIUM-DEXTROSE 2-4 GM/100ML-% IV SOLN
INTRAVENOUS | Status: AC
  Filled 2023-09-22: qty 100

## 2023-09-22 MED ORDER — PROPOFOL 10 MG/ML IV BOLUS
INTRAVENOUS | Status: AC
  Filled 2023-09-22: qty 40

## 2023-09-22 MED ORDER — MIDAZOLAM HCL 2 MG/2ML IJ SOLN
1.0000 mg | Freq: Once | INTRAMUSCULAR | Status: AC
  Administered 2023-09-22: 1 mg via INTRAVENOUS

## 2023-09-22 MED ORDER — LIDOCAINE HCL (PF) 1 % IJ SOLN
INTRAMUSCULAR | Status: AC
  Filled 2023-09-22: qty 5

## 2023-09-22 SURGICAL SUPPLY — 32 items
BIT DRILL 2.2 CANN STRGHT (BIT) IMPLANT
BLADE MED AGGRESSIVE (BLADE) IMPLANT
BLADE OSC/SAGITTAL MD 5.5X18 (BLADE) IMPLANT
BLADE SAW SAG MICRO THIN 65D (BLADE) ×2 IMPLANT
BLADE SURG 15 STRL LF DISP TIS (BLADE) ×4 IMPLANT
BLADE SW THK.38XMED LNG THN (BLADE) IMPLANT
BNDG COHESIVE 4X5 TAN STRL LF (GAUZE/BANDAGES/DRESSINGS) ×2 IMPLANT
BNDG ESMARCH 4X12 STRL LF (GAUZE/BANDAGES/DRESSINGS) ×2 IMPLANT
BNDG STRETCH GAUZE 3IN X12FT (GAUZE/BANDAGES/DRESSINGS) ×2 IMPLANT
BOOT STEPPER DURA MED (SOFTGOODS) IMPLANT
CHLORAPREP W/TINT 26 (MISCELLANEOUS) ×2 IMPLANT
CUFF TOURN SGL QUICK 18X4 (TOURNIQUET CUFF) ×2 IMPLANT
DRAPE FLUOR MINI C-ARM 54X84 (DRAPES) ×2 IMPLANT
ELECT REM PT RETURN 9FT ADLT (ELECTROSURGICAL) ×2
ELECTRODE REM PT RTRN 9FT ADLT (ELECTROSURGICAL) ×2 IMPLANT
GAUZE PAD ABD 8X10 STRL (GAUZE/BANDAGES/DRESSINGS) ×2 IMPLANT
GAUZE SPONGE 4X4 12PLY STRL (GAUZE/BANDAGES/DRESSINGS) ×2 IMPLANT
GLOVE BIOGEL M 7.0 STRL (GLOVE) ×2 IMPLANT
GLOVE BIOGEL PI IND STRL 7.5 (GLOVE) ×2 IMPLANT
GOWN STRL REUS W/ TWL LRG LVL3 (GOWN DISPOSABLE) ×2 IMPLANT
GUIDEWIRE .045XTROC TIP LSR LN (WIRE) IMPLANT
KIT TURNOVER KIT A (KITS) ×2 IMPLANT
PACK EXTREMITY ARMC (MISCELLANEOUS) ×2 IMPLANT
PAD PREP OB/GYN DISP 24X41 (PERSONAL CARE ITEMS) ×2 IMPLANT
SCREW CANN FT QF 3.6X28 (Screw) IMPLANT
SLEEVE SCD COMPRESS KNEE MED (STOCKING) ×2 IMPLANT
STOCKINETTE ORTHO 6X25 (MISCELLANEOUS) ×2 IMPLANT
SUT ETHILON 4-0 FS2 18XMFL BLK (SUTURE) ×2
SUT MNCRL AB 3-0 PS2 27 (SUTURE) ×2 IMPLANT
SUT STRATA 4-0 30 PS-2 (SUTURE) IMPLANT
SUTURE ETHLN 4-0 FS2 18XMF BLK (SUTURE) IMPLANT
SYR CONTROL 10ML LL (SYRINGE) ×2 IMPLANT

## 2023-09-22 NOTE — Transfer of Care (Signed)
Immediate Anesthesia Transfer of Care Note  Patient: Erin Dennis  Procedure(s) Performed: Serafina Royals (Left: Toe) HARDWARE REMOVAL (Left: Foot)  Patient Location: PACU  Anesthesia Type:General  Level of Consciousness: drowsy  Airway & Oxygen Therapy: Patient Spontanous Breathing and Patient connected to face mask oxygen  Post-op Assessment: Report given to RN and Post -op Vital signs reviewed and stable  Post vital signs: Reviewed and stable  Last Vitals:  Vitals Value Taken Time  BP 123/74 09/22/23 1130  Temp    Pulse 81 09/22/23 1131  Resp 14 09/22/23 1131  SpO2 100 % 09/22/23 1131  Vitals shown include unfiled device data.  Last Pain:  Vitals:   09/22/23 0757  TempSrc: Temporal  PainSc: 0-No pain      Patients Stated Pain Goal: 0 (09/22/23 0757)  Complications: No notable events documented.

## 2023-09-22 NOTE — Anesthesia Procedure Notes (Signed)
Anesthesia Regional Block: Other ([place])   Pre-Anesthetic Checklist: , timeout performed,  Correct Patient, Correct Site, Correct Laterality,  Correct Procedure, Correct Position, site marked,  Risks and benefits discussed,  Surgical consent,  Pre-op evaluation,  At surgeon's request and post-op pain management  Laterality: Left and Lower  Prep: chloraprep       Needles:  Injection technique: Single-shot  Needle Type: Echogenic Needle     Needle Length: 9cm  Needle Gauge: 21     Additional Needles:   Procedures:,,,, ultrasound used (permanent image in chart),,    Narrative:  Start time: 09/22/2023 8:43 AM End time: 09/22/2023 8:45 AM Injection made incrementally with aspirations every 5 mL.  Performed by: Personally  Anesthesiologist: [name], MD

## 2023-09-22 NOTE — H&P (Signed)
Erin Dennis is an 43 y.o. female.   Chief Complaint: Painful hardware left foot, bunion left foot HPI: Patient presents today for surgery for her left foot residual bunion and painful hardware.  There have been no changes to her medical history, medications or social history.  She did have a bout of the flu a few weeks ago but has recovered completely.  She feels well and is ready to proceed with surgery.  Past Medical History:  Diagnosis Date   Anxiety    Darier disease    Follows with Dr. Waynette Buttery in [place], [place]., dermatological   Depression    following miscarriage 2021, improved 2023   DISH (diffuse idiopathic skeletal hyperostosis)    from accutane use per pt   Febrile seizure (HCC)    as a baby, no epilepsy per pt   Hematoma 10/15/2020   perineum after childbirth   Infection    UTI   Ovarian cyst    x 3   Personal history of gestational diabetes 2022   Uterovaginal prolapse, incomplete 2022    Past Surgical History:  Procedure Laterality Date   ANTERIOR AND POSTERIOR REPAIR WITH SACROSPINOUS FIXATION N/A 09/06/2021   Procedure: ANTERIOR AND POSTERIOR REPAIR WITH PERINEORRHAPHY;  Surgeon: Marguerita Beards, MD;  Location: [facility];  Service: Gynecology;  Laterality: N/A;   APPENDECTOMY     BLADDER SUSPENSION N/A 09/06/2021   Procedure: TRANSVAGINAL TAPE (TVT) PROCEDURE;  Surgeon: Marguerita Beards, MD;  Location: [facility];  Service: Gynecology;  Laterality: N/A;   CHOLECYSTECTOMY     CYSTOSCOPY N/A 09/06/2021   Procedure: CYSTOSCOPY;  Surgeon: Marguerita Beards, MD;  Location: [facility];  Service: Gynecology;  Laterality: N/A;   CYSTOSCOPY N/A 06/14/2022   Procedure: CYSTOSCOPY;  Surgeon: Marguerita Beards, MD;  Location: [facility];  Service: Gynecology;  Laterality: N/A;   DILATATION AND CURETTAGE/HYSTEROSCOPY WITH MINERVA N/A 12/09/2020   Procedure: DILATATION AND  CURETTAGE/HYSTEROSCOPY WITH ATTEMPTED  MINERVA  ENDOMETRIAL ABLATION;  Surgeon: Lazaro Arms, MD;  Location: [facility];  Service: Gynecology;  Laterality: N/A;   DILATION AND CURETTAGE OF UTERUS     for menorrhagia   DILATION AND CURETTAGE OF UTERUS N/A 10/07/2019   Procedure: SUCTION DILATATION AND CURETTAGE;  Surgeon: Lazaro Arms, MD;  Location: [facility];  Service: Gynecology;  Laterality: N/A;  Dr. Request Time 9:30am   FOOT FRACTURE SURGERY Left 05/06/2022   [facility]   HALLUX VALGUS LAPIDUS Right 09/14/2022   Procedure: HALLUX VALGUS LAPIDUS WITH BONE GRAFT;  Surgeon: Edwin Cap, DPM;  Location: [facility];  Service: Podiatry;  Laterality: Right;   IUD REMOVAL N/A 06/14/2022   Procedure: INTRAUTERINE DEVICE (IUD) REMOVAL;  Surgeon: Marguerita Beards, MD;  Location: [facility];  Service: Gynecology;  Laterality: N/A;   LAPAROSCOPIC BILATERAL SALPINGECTOMY Bilateral 12/09/2020   Procedure: LAPAROSCOPIC BILATERAL SALPINGECTOMY;  Surgeon: Lazaro Arms, MD;  Location: [facility];  Service: Gynecology;  Laterality: Bilateral;   METATARSAL OSTEOTOMY Right 09/14/2022   Procedure: METATARSAL OSTEOTOMY;  Surgeon: Edwin Cap, DPM;  Location: [facility];  Service: Podiatry;  Laterality: Right;   OVARIAN CYST SURGERY     x3   TONSILLECTOMY  2007   WISDOM TOOTH EXTRACTION     XI ROBOTIC ASSISTED TOTAL HYSTERECTOMY WITH SACROCOLPOPEXY N/A 06/14/2022   Procedure: XI ROBOTIC ASSISTED TOTAL HYSTERECTOMY, SACROCOLPOPEXY;  Surgeon: Marguerita Beards, MD;  Location: [facility];  Service: Gynecology;  Laterality: N/A;    Family History  Problem Relation Age of Onset   Diabetes Mother    Hypertension Mother    Diabetes Father    Stroke Father    Cancer Maternal Grandmother        ovarian   Jaundice Son    Social History:  reports that she has never smoked. She has never used smokeless tobacco. She reports that she does not currently use  alcohol. She reports that she does not currently use drugs.  Allergies:  Allergies  Allergen Reactions   Acetaminophen Anaphylaxis and Rash   5-Alpha Reductase Inhibitors     Blisters and itching   Sulfamethoxazole-Trimethoprim Other (See Comments)    Blisters and itchiness   Tape Rash    Any tape    Medications Prior to Admission  Medication Sig Dispense Refill   acitretin (SORIATANE) 10 MG capsule Take 1 capsule (10 mg total) by mouth daily before breakfast. 90 capsule 3   gabapentin (NEURONTIN) 300 MG capsule Take 1 capsule (300 mg total) by mouth 3 (three) times daily. 90 capsule 0   albuterol (VENTOLIN HFA) 108 (90 Base) MCG/ACT inhaler Inhale 1-2 puffs into the lungs every 6 (six) hours as needed. 8 g 0   benzonatate (TESSALON) 100 MG capsule Take 1-2 capsules (100-200 mg total) by mouth 3 (three) times daily as needed. (Patient not taking: Reported on 09/22/2023) 30 capsule 0   clobetasol cream (TEMOVATE) 0.05 % Apply 1 Application topically 2 (two) times daily. (Patient not taking: Reported on 09/22/2023) 30 g 0   doxycycline (VIBRA-TABS) 100 MG tablet Take 1 tablet (100 mg total) by mouth 2 (two) times daily. (Patient not taking: Reported on 09/22/2023) 20 tablet 0   lidocaine (LIDODERM) 5 % Place 1 patch onto the skin daily. Remove & Discard patch within 12 hours or as directed by MD (Patient not taking: Reported on 09/22/2023) 30 patch 0   ondansetron (ZOFRAN) 4 MG tablet Take 1 tablet (4 mg total) by mouth every 8 (eight) hours as needed for nausea or vomiting. (Patient not taking: Reported on 09/22/2023) 20 tablet 0   predniSONE (DELTASONE) 10 MG tablet Days 1-4 take 4 tablets (40 mg) daily  Days 5-8 take 3 tablets (30 mg) daily, Days 9-11 take 2 tablets (20 mg) daily, Days 12-14 take 1 tablet (10 mg) daily. (Patient not taking: Reported on 09/22/2023) 37 tablet 0   triamcinolone (KENALOG) 0.025 % ointment Apply 1 Application topically 2 (two) times daily. (Patient not taking:  Reported on 09/22/2023) 30 g 0    No results found for this or any previous visit (from the past 48 hours). DG MINI C-ARM IMAGE ONLY Result Date: 09/22/2023 There is no interpretation for this exam.  This order is for images obtained during a surgical procedure.  Please See "Surgeries" Tab for more information regarding the procedure.   [place] OR NERVE BLOCK-IMAGE ONLY [facility]) Result Date: 09/22/2023 There is no interpretation for this exam.  This order is for images obtained during a surgical procedure.  Please See "Surgeries" Tab for more information regarding the procedure.    Review of Systems  Constitutional:  Negative for fever.  Respiratory:  Negative for shortness of breath.   Cardiovascular:  Negative for chest pain.  Gastrointestinal:  Negative for abdominal pain, diarrhea, nausea and vomiting.  Neurological:  Negative for syncope and light-headedness.  All other systems reviewed and are negative.   Blood pressure (!) 133/92, pulse 61, temperature  97.6 F (36.4 C), temperature source Temporal, resp. rate 18, height 5\' 1"  (1.549 m), weight 64.9 kg, SpO2 98%. Physical Exam Vitals and nursing note reviewed.  Constitutional:      Appearance: Normal appearance.  HENT:     Head: Normocephalic and atraumatic.     Mouth/Throat:     Mouth: Mucous membranes are moist.  Eyes:     Pupils: Pupils are equal, round, and reactive to light.  Cardiovascular:     Rate and Rhythm: Normal rate and regular rhythm.     Pulses: Normal pulses.     Heart sounds: Normal heart sounds.  Pulmonary:     Effort: Pulmonary effort is normal.     Breath sounds: Normal breath sounds.  Skin:    General: Skin is warm.     Capillary Refill: Capillary refill takes less than 2 seconds.  Neurological:     Mental Status: She is alert and oriented to person, place, and time.  Psychiatric:        Mood and Affect: Mood normal.        Behavior: Behavior normal.      Assessment/Plan Painful orthopedic  hardware and residual bunion of left foot     Erin Dennis  has presented today for surgery, with the diagnosis of Painful orthopedic hardware and residual bunion of left foot.  The various methods of treatment have been discussed with the patient and family. After consideration of risks, benefits and other options for treatment, the patient has consented to removal of orthopedic implants and first metatarsal osteotomy of left foot as a surgical intervention.  The patient's history has been reviewed, patient examined, no change in status, stable for surgery.  I have reviewed the patient's chart and labs.  Questions were answered to the patient's satisfaction.       Edwin Cap, DPM 09/22/2023, 9:06 AM

## 2023-09-22 NOTE — Anesthesia Procedure Notes (Signed)
Anesthesia Regional Block: Popliteal block   Pre-Anesthetic Checklist: , timeout performed,  Correct Patient, Correct Site, Correct Laterality,  Correct Procedure, Correct Position, site marked,  Risks and benefits discussed,  Surgical consent,  Pre-op evaluation,  At surgeon's request and post-op pain management  Laterality: Lower and Left  Prep: chloraprep       Needles:  Injection technique: Single-shot  Needle Type: Echogenic Needle     Needle Length: 9cm  Needle Gauge: 21     Additional Needles:   Procedures:,,,, ultrasound used (permanent image in chart),,    Narrative:  Start time: 09/22/2023 8:38 AM End time: 09/22/2023 8:40 AM Injection made incrementally with aspirations every 5 mL.  Performed by: Personally  Anesthesiologist: [name], MD  Additional Notes: Patient consented for risk and benefits of nerve block including but not limited to nerve damage, failed block, bleeding and infection.  Patient voiced understanding.  Functioning IV was confirmed and monitors were applied.  Timeout done prior to procedure and prior to any sedation being given to the patient.  Patient confirmed procedure site prior to any sedation given to the patient.  A 50mm 22ga Stimuplex needle was used. Sterile prep,hand hygiene and sterile gloves were used.  Minimal sedation used for procedure.  No paresthesia endorsed by patient during the procedure.  Negative aspiration and negative test dose prior to incremental administration of local anesthetic. The patient tolerated the procedure well with no immediate complications.

## 2023-09-22 NOTE — Op Note (Addendum)
Patient Name: Erin Dennis DOB: Apr 14, 1981  MRN: [id number]   Date of Service: 09/22/2023  Surgeon: Dr. Sharl Ma, DPM Assistants: None Pre-operative Diagnosis:  Hallux abducto valgus Painful Hardware Post-operative Diagnosis:  Hallux abducto valgus Painful Hardware Procedures: Bunionectomy with distal metatarsal Reverdin osteotomy Removal of first TMT orthopedic implants Pathology/Specimens: * No specimens in log * Anesthesia: GA with regional block Hemostasis:  Total Tourniquet Time Documented: Calf (Left) - 58 minutes Total: Calf (Left) - 58 minutes  Estimated Blood Loss: 5 mL Materials:  Implant Name Type Inv. Item Serial No. Manufacturer Lot No. LRB No. Used Action  SCREW CANN FT QF 3.6X28 - [id number] Screw SCREW CANN FT QF 3.6X28  [employer]  Right 1 Implanted   Medications: None Complications: No complications  Indications for Procedure:  This is a 43 y.o. female with a history of previous Lapidus first TMT arthrodesis, she developed pain over the implanted hardware.  She also had residual hallux valgus deformity with dorsal medial bunion formation.  Preoperative x-rays showed corrected IM angle but residual proximal articular set angle deviation with hallux valgus.  After failing nonoperative treatment she elected for operative intervention.  All questions were addressed and risk benefits and potential complications were discussed prior to surgery.  Informed consent signed and reviewed   Procedure in Detail: Patient was identified in pre-operative holding area. Formal consent was signed and the left lower extremity was marked. Patient was brought back to the operating room. Anesthesia was induced. The extremity was prepped and draped in the usual sterile fashion. Timeout was taken to confirm patient name, laterality, and procedure prior to incision.   Attention was then directed to the left foot where an incision was made through the previous incision on the  dorsal first TMT.  This was carried deep to subcutaneous tissue.  Large amount of scar tissue was encountered.  The EHL tendon was identified and the deep fascia was incised and this was retracted laterally.  The plates and screws were identified and removed uneventfully.  This portion of the incision was then thoroughly irrigated.  I then carried my dissection distally onto the distal first metatarsal.  This was carried deep to subcutaneous tissue cauterizing bleeding vessels as necessary.  The EHL tendon here was retracted laterally and the dorsal medial first metatarsal head was exposed which had a prominent dorsal medial bunion.  This was resected with a sagittal saw and rasped smooth.  I made a small incision in the first webspace over the lateral 1st MTP. Blunt dissection was used to expose the lateral capsule and sesamoidal suspensory ligament. The lateral contracture was released. To correct the proximal articular set angle I then created a medial wedge based osteotomy in a Reverdin fashion.  This was done with a sagittal saw and then the osteotomy was reduced while feathering the lateral portion leaving the hinge intact.  Once this was adequately reduced and the deformity was corrected it was temporarily fixated.  Final fixation with a 3.5 mm fully threaded compression screw was then placed.  Final films were taken.  The incision was thoroughly irrigated and closed in layers with 3-0 Monocryl and 4-0 nylon.  The foot was then dressed with Xeroform and dry sterile dressings. Patient tolerated the procedure well.   Disposition: Following a period of post-operative monitoring, patient will be transferred to home.

## 2023-09-22 NOTE — Anesthesia Postprocedure Evaluation (Signed)
Anesthesia Post Note  Patient: Erin Dennis  Procedure(s) Performed: Serafina Royals (Left: Toe) HARDWARE REMOVAL (Left: Foot)  Patient location during evaluation: PACU Anesthesia Type: General Level of consciousness: awake and alert Pain management: pain level controlled Vital Signs Assessment: post-procedure vital signs reviewed and stable Respiratory status: spontaneous breathing, nonlabored ventilation, respiratory function stable and patient connected to nasal cannula oxygen Cardiovascular status: blood pressure returned to baseline and stable Postop Assessment: no apparent nausea or vomiting Anesthetic complications: no   No notable events documented.   Last Vitals:  Vitals:   09/22/23 1200 09/22/23 1231  BP: 126/77 117/80  Pulse: 68 73  Resp: 20 18  Temp: 36.4 C 37 C  SpO2: 100% 100%    Last Pain:  Vitals:   09/22/23 1231  TempSrc: Temporal  PainSc:                  [name]

## 2023-09-22 NOTE — OR Nursing (Signed)
2 plates and 9 screws explanted from left foot. Dispose of in sharps container per MD.

## 2023-09-22 NOTE — Anesthesia Preprocedure Evaluation (Signed)
Anesthesia Evaluation  Patient identified by MRN, date of birth, ID band Patient awake    Reviewed: Allergy & Precautions, NPO status , Patient's Chart, lab work & pertinent test results  History of Anesthesia Complications Negative for: history of anesthetic complications  Airway Mallampati: III   Neck ROM: Full    Dental  (+) Missing, Dental Advidsory Given   Pulmonary neg pulmonary ROS   Pulmonary exam normal breath sounds clear to auscultation       Cardiovascular Exercise Tolerance: Good negative cardio ROS Normal cardiovascular exam Rhythm:Regular Rate:Normal     Neuro/Psych  PSYCHIATRIC DISORDERS Anxiety Depression    negative neurological ROS     GI/Hepatic negative GI ROS,,,  Endo/Other  negative endocrine ROS    Renal/GU negative Renal ROS     Musculoskeletal   Abdominal   Peds  Hematology negative hematology ROS (+)   Anesthesia Other Findings Past Medical History: No date: Anxiety No date: Darier disease     Comment:  Follows with Dr. Waynette Buttery in [place], [place].,               dermatological No date: Depression     Comment:  following miscarriage 2021, improved 2023 No date: DISH (diffuse idiopathic skeletal hyperostosis)     Comment:  from accutane use per pt No date: Febrile seizure (HCC)     Comment:  as a baby, no epilepsy per pt 10/15/2020: Hematoma     Comment:  perineum after childbirth No date: Infection     Comment:  UTI No date: Ovarian cyst     Comment:  x 3 2022: Personal history of gestational diabetes 2022: Uterovaginal prolapse, incomplete   Reproductive/Obstetrics                             Anesthesia Physical Anesthesia Plan  ASA: 2  Anesthesia Plan: General   Post-op Pain Management: Regional block*   Induction: Intravenous  PONV Risk Score and Plan: 3 and Ondansetron, Dexamethasone and Treatment may vary due to age or medical  condition  Airway Management Planned: LMA  Additional Equipment:   Intra-op Plan:   Post-operative Plan: Extubation in OR  Informed Consent: I have reviewed the patients History and Physical, chart, labs and discussed the procedure including the risks, benefits and alternatives for the proposed anesthesia with the patient or authorized representative who has indicated his/her understanding and acceptance.     Dental advisory given  Plan Discussed with: CRNA  Anesthesia Plan Comments: (Patient consented for risks of anesthesia including but not limited to:  - adverse reactions to medications - damage to eyes, teeth, lips or other oral mucosa - nerve damage due to positioning  - sore throat or hoarseness - damage to heart, brain, nerves, lungs, other parts of body or loss of life  Informed patient about role of CRNA in peri- and intra-operative care.  Patient voiced understanding.)       Anesthesia Quick Evaluation

## 2023-09-22 NOTE — Discharge Instructions (Addendum)
Information for Discharge Teaching:  DO NOT REMOVE TEAL EXPAREL BRACELET FOR 4 Days, 09/26/2023 EXPAREL (bupivacaine liposome injectable suspension)   Pain relief is important to your recovery. The goal is to control your pain so you can move easier and return to your normal activities as soon as possible after your procedure. Your physician may use several types of medicines to manage pain, swelling, and more.  Your surgeon or anesthesiologist gave you EXPAREL(bupivacaine) to help control your pain after surgery.  EXPAREL is a local anesthetic designed to release slowly over an extended period of time to provide pain relief by numbing the tissue around the surgical site. EXPAREL is designed to release pain medication over time and can control pain for up to 72 hours. Depending on how you respond to EXPAREL, you may require less pain medication during your recovery. EXPAREL can help reduce or eliminate the need for opioids during the first few days after surgery when pain relief is needed the most. EXPAREL is not an opioid and is not addictive. It does not cause sleepiness or sedation.   Important! A teal colored band has been placed on your arm with the date, time and amount of EXPAREL you have received. Please leave this armband in place for the full 96 hours following administration, and then you may remove the band. If you return to the hospital for any reason within 96 hours following the administration of EXPAREL, the armband provides important information that your health care providers to know, and alerts them that you have received this anesthetic.    Possible side effects of EXPAREL: Temporary loss of sensation or ability to move in the area where medication was injected. Nausea, vomiting, constipation Rarely, numbness and tingling in your mouth or lips, lightheadedness, or anxiety may occur. Call your doctor right away if you think you may be experiencing any of these sensations, or if  you have other questions regarding possible side effects.  Follow all other discharge instructions given to you by your surgeon or nurse. Eat a healthy diet and drink plenty of water or other fluids.  Post-Surgery Instructions  1. If you are recuperating from surgery anywhere other than home, please be sure to leave [place] a number where you can be reached. 2. Go directly home and rest. 3. The keep operated foot (or feet) elevated six inches above the hip when sitting or lying down. 4. Support the elevated foot and leg with pillows under the calf. DO NOT PLACE PILLOWS UNDER THE KNEE. 5. DO NOT REMOVE or get your bandages wet. This will increase your chances of getting an infection. 6. Wear your surgical shoe at all times when you are up. 7. A limited amount of pain and swelling may occur. The skin may take on a bruised appearance. This is no cause for alarm. 8. For slight pain and swelling, apply an ice pack directly over the bandage for 15 minutes every hour. Continue icing until seen in the office. DO NOT apply any form of heat to the area. 9. Have prescription(s) filled immediately and take as directed. 10. Drink lots of liquids, water, and juice. 11. CALL THE OFFICE IMMEDIATELY IF: a. Bleeding continues b. Pain increases and/or does not respond to medication c. Bandage or cast appears too tight d. Any liquids (water, coffee, etc.) have spilled on your bandages. e. Tripping, falling, or stubbing the surgical foot f. If your temperature rises above 101 g. If you have ANY questions at all 12. Please use  the crutches, knee scooter, or walker you have prescribed, rented, or purchased. If you are non-weight bearing DO NOT put weight on the operated foot for _________ days. If you are weight-bearing, follow your physician's instructions. You are expected to be: ? weight-bearing   13. Special Instructions:  _____________________________________________________________ _________________________________________________________________________________ _________________________________________________________________________________  14. Your next appointment is:  09/28/2023 10:45 AM     If you need to reach the nurse for any reason, please call: [place]: [phone] [place]: [phone] : [phone]

## 2023-09-23 ENCOUNTER — Encounter: Payer: Self-pay | Admitting: Podiatry

## 2023-09-25 ENCOUNTER — Encounter: Payer: Self-pay | Admitting: Podiatry

## 2023-09-28 ENCOUNTER — Ambulatory Visit (INDEPENDENT_AMBULATORY_CARE_PROVIDER_SITE_OTHER): Payer: Medicaid Other | Admitting: Podiatry

## 2023-09-28 ENCOUNTER — Other Ambulatory Visit: Payer: Self-pay | Admitting: Podiatry

## 2023-09-28 ENCOUNTER — Ambulatory Visit (INDEPENDENT_AMBULATORY_CARE_PROVIDER_SITE_OTHER): Payer: Medicaid Other

## 2023-09-28 ENCOUNTER — Telehealth: Payer: Self-pay | Admitting: Podiatry

## 2023-09-28 ENCOUNTER — Encounter: Payer: Self-pay | Admitting: Podiatry

## 2023-09-28 DIAGNOSIS — S92312A Displaced fracture of first metatarsal bone, left foot, initial encounter for closed fracture: Secondary | ICD-10-CM

## 2023-09-28 DIAGNOSIS — M2012 Hallux valgus (acquired), left foot: Secondary | ICD-10-CM

## 2023-09-28 MED ORDER — GABAPENTIN 300 MG PO CAPS: 300.0000 mg | ORAL_CAPSULE | Freq: Three times a day (TID) | ORAL | 0 refills | Status: DC

## 2023-09-28 MED ORDER — OXYCODONE HCL 5 MG PO TABS: 5.0000 mg | ORAL_TABLET | ORAL | 0 refills | Status: DC | PRN

## 2023-09-28 NOTE — Progress Notes (Signed)
  Subjective:  Patient ID: Erin Dennis, female    DOB: 05-27-1981,  MRN: [id number]  Chief Complaint  Patient presents with   Routine Post Op    DOS 09/22/23, PLATE AND SCREW REMOVAL LEFT FOOT, POSSIBLE [place] PROCEDURE AND REVISION BUNION Pt stated that she took off  her surgical bandages because of the blood she stated that she put on some xeroform and an ace wrap, she stated that at home she does not wear her boot all the time walks on the side of her foot when she does not have it on. After her Xrays today she walked to the room without her boot.      43 y.o. female returns for post-op check.  She noted severe pain developing yesterday  Review of Systems: Negative except as noted in the HPI. Denies N/V/F/Ch.   Objective:  There were no vitals filed for this visit. There is no height or weight on file to calculate BMI. Constitutional Well developed. Well nourished.  Vascular Foot warm and well perfused. Capillary refill normal to all digits.  Calf is soft and supple, no posterior calf or knee pain, negative Homans' sign  Neurologic Normal speech. Oriented to person, place, and time. Epicritic sensation to light touch grossly present bilaterally.  Dermatologic Skin healing well without signs of infection. Skin edges well coapted without signs of infection.  Orthopedic: Tenderness to palpation noted about the surgical site.   Multiple view plain film radiographs: Interval plate and screw removal of the left foot, there is new fracture of the lateral hinge of Reverdin and osteotomy with loss of purchase of screw and backout since immediate postop films Assessment:   1. Closed displaced fracture of first metatarsal bone of left foot, initial encounter    Plan:  Patient was evaluated and treated and all questions answered.  S/p foot surgery left -Unfortunately has fracture of the lateral hinge and loss of purchase of the hardware.  I recommended immediate revision and removal  of the screw backing out and repositioning possible wire fixation for the fracture as well.  Discussed the importance of staying off her foot and remaining nonweightbearing.  Surgery will be scheduled for tomorrow.  No follow-ups on file.

## 2023-09-28 NOTE — Telephone Encounter (Signed)
 DOS-09/29/23  REPAIR BROKEN 1ST MET. [id number]  [facility] EFFECTIVE DATE-08/02/23   PER THE [employer] PORTAL,PRIOR AUTH HAS BEEN APPROVED FOR CPT CODE 57846, GOOD FROM 09/29/23 - 12/28/23.   AUTH REFERENCE NUMBER: [id number]

## 2023-09-29 ENCOUNTER — Other Ambulatory Visit: Payer: Self-pay | Admitting: Podiatry

## 2023-09-29 DIAGNOSIS — S92312A Displaced fracture of first metatarsal bone, left foot, initial encounter for closed fracture: Secondary | ICD-10-CM | POA: Diagnosis not present

## 2023-09-29 MED ORDER — CEPHALEXIN 500 MG PO CAPS: 500.0000 mg | ORAL_CAPSULE | Freq: Three times a day (TID) | ORAL | 0 refills | Status: DC

## 2023-09-29 MED ORDER — MORPHINE SULFATE 15 MG PO TABS: 7.5000 mg | ORAL_TABLET | Freq: Four times a day (QID) | ORAL | 0 refills | Status: DC | PRN

## 2023-09-29 MED ORDER — OXYCODONE HCL 5 MG PO TABS: 5.0000 mg | ORAL_TABLET | ORAL | 0 refills | Status: DC | PRN

## 2023-09-29 NOTE — Progress Notes (Signed)
 Oxy sent per pt request

## 2023-09-29 NOTE — Progress Notes (Signed)
 Oxycodone on back order, changed to PO morphine

## 2023-10-01 ENCOUNTER — Other Ambulatory Visit (INDEPENDENT_AMBULATORY_CARE_PROVIDER_SITE_OTHER): Payer: Self-pay | Admitting: Podiatry

## 2023-10-01 DIAGNOSIS — Z9889 Other specified postprocedural states: Secondary | ICD-10-CM

## 2023-10-01 DIAGNOSIS — Z91199 Patient's noncompliance with other medical treatment and regimen due to unspecified reason: Secondary | ICD-10-CM

## 2023-10-01 NOTE — Progress Notes (Signed)
 Had several telephone discussions via the answering service with the patient over the past 2 days.  Patient first called on Friday evening September 29, 2023.  Was concerned that the morphine 15 mg tablets she had received were not doing anything for her pain.  She was requesting oxycodone.  Sent her for 20 oxycodone tablets and advised to discontinue the morphine.  Also advised to elevate and ice  On Saturday, September 30, 2023 patient called in the morning saying pain meds were not working she was in severe pain.  I advised her to loosen the Ace wrap elevate continuously and use ice behind the knee.  Recommend ibuprofen every 8 hours along with oxycodone every 4 and that she could use 2 of the 5 mg oxycodone tablets every 4 hours if pain not controlled.  Advised her to use crutches and stay nonweightbearing on the foot as previously recommended.  Later in the day on Saturday patient called again through the answering service.  Stated she had a pin sticking out of her foot.  Reported that she had removed her dressing.  This was against advised recommendation to keep dressing clean dry and intact and not remove it postoperatively.  She stated that the dressing was saturated with blood so this is why she needed to remove it.  She said that part of the incision had "busted open".  I had patient send me pictures of the foot via text which demonstrated no gross dehiscence of any of the incisions there was slight opening of the midfoot central stab incision does appear to be any active bleeding.  No erythema or evidence of infection.  There was edema.  The K wire that was previously placed during surgery appear to be in appropriate position and had not backed out.  Patient was concerned about the K wire.  Patient stated she had Xeroform and gauze dressing supplies.  I recommended she rewrap the foot with Xeroform on the incisions followed by 4 x 4 gauze gauze roll and Ace wrap.  I recommended strict elevation of the  foot along with ice behind the knee for edema control and continued pain medications as needed.  Patient states she will call the office Monday morning to get in sooner for follow-up appointment.

## 2023-10-02 ENCOUNTER — Encounter: Payer: Self-pay | Admitting: Podiatry

## 2023-10-02 ENCOUNTER — Ambulatory Visit (INDEPENDENT_AMBULATORY_CARE_PROVIDER_SITE_OTHER): Admitting: Podiatry

## 2023-10-02 ENCOUNTER — Ambulatory Visit (INDEPENDENT_AMBULATORY_CARE_PROVIDER_SITE_OTHER)

## 2023-10-02 DIAGNOSIS — Z9889 Other specified postprocedural states: Secondary | ICD-10-CM | POA: Diagnosis not present

## 2023-10-03 ENCOUNTER — Encounter: Payer: Self-pay | Admitting: Podiatry

## 2023-10-03 DIAGNOSIS — R2689 Other abnormalities of gait and mobility: Secondary | ICD-10-CM

## 2023-10-03 DIAGNOSIS — S92312A Displaced fracture of first metatarsal bone, left foot, initial encounter for closed fracture: Secondary | ICD-10-CM

## 2023-10-03 NOTE — Progress Notes (Signed)
  Subjective:  Patient ID: Erin Dennis, female    DOB: [birth year],  MRN: [id number]  Chief Complaint  Patient presents with   Routine Post Op    POV#1 DOS 09/29/2023  "It still feels like something is wrong.  I didn't realize he had a push pin sticking outside of my toe until I pulled my sock off."   43 y.o. female returns for post-op check.  She returns today for follow-up for her second surgery in 2 weeks after fracturing her osteotomy, she stopped taking the morphine and Dr. Annamary Rummage prescribed oxycodone again for her and she has been using this.  She called him over the weekend after she removed her dressing on Saturday and was concerned about the percutaneous K wire.  She presents ambulating on the boot without crutches  Review of Systems: Negative except as noted in the HPI. Denies N/V/F/Ch.   Objective:  There were no vitals filed for this visit. There is no height or weight on file to calculate BMI. Constitutional Well developed. Well nourished.  Vascular Foot warm and well perfused. Capillary refill normal to all digits.  Calf is soft and supple, no posterior calf or knee pain, negative Homans' sign  Neurologic Normal speech. Oriented to person, place, and time. Epicritic sensation to light touch grossly present bilaterally.  Dermatologic Skin healing well without signs of infection. Skin edges well coapted without signs of infection.  Orthopedic: Tenderness to palpation noted about the surgical site.  Moderate edema.  Unchanged alignment of Kirschner wire   Multiple view plain film radiographs: Unchanged alignment since immediate postop films, no complication of screw or K wire Assessment:   1. Status post surgery    Plan:  Patient was evaluated and treated and all questions answered.  S/p foot surgery left -Over the weekend she removed her dressing against my medical advice for her postop instructions as she was instructed to leave it in place until her visit this  week.  She was concerned about the K wire in the foot, we discussed again today that this is something that we discussed in the preoperative holding area, she did not understand that it would be through the skin.  I discussed with her this will be in place for at least 6 weeks pending progress on her radiographs.  New sterile bandage was applied today.  I advised her not to remove it once again until her follow-up with me next week for suture removal.  I also discussed with her she is to be nonweightbearing, she said she had difficulty with her crutches and I advised her to get a knee scooter from a medical supply store such as [employer] that she may purchase or rent.  No follow-ups on file.

## 2023-10-10 ENCOUNTER — Ambulatory Visit (INDEPENDENT_AMBULATORY_CARE_PROVIDER_SITE_OTHER)

## 2023-10-10 ENCOUNTER — Encounter: Payer: Self-pay | Admitting: Podiatry

## 2023-10-10 ENCOUNTER — Telehealth: Payer: Self-pay | Admitting: Podiatry

## 2023-10-10 ENCOUNTER — Ambulatory Visit (INDEPENDENT_AMBULATORY_CARE_PROVIDER_SITE_OTHER): Admitting: Podiatry

## 2023-10-10 DIAGNOSIS — S92312D Displaced fracture of first metatarsal bone, left foot, subsequent encounter for fracture with routine healing: Secondary | ICD-10-CM

## 2023-10-10 DIAGNOSIS — Z9889 Other specified postprocedural states: Secondary | ICD-10-CM | POA: Diagnosis not present

## 2023-10-10 DIAGNOSIS — Z91199 Patient's noncompliance with other medical treatment and regimen due to unspecified reason: Secondary | ICD-10-CM

## 2023-10-10 NOTE — Progress Notes (Signed)
 This patient was scheduled for suture removal post op visit on 3/13, wanted to be seen today and as I had a cancellation at 3:30 today we scheduled her in this time slot, no guarantees were made that I would be able to see her prior to this time. Xrays were taken by the CMA and reviewed by me and did not show any complication of fixation or the osteotomy. I went to her exam room at 3:31 and she had left without being seen by me to take her mother to an appointment. She is scheduled for tomorrow 3/12 in the [place] office for suture removal.    Sharl Ma, DPM 10/10/2023 3:39 PM

## 2023-10-10 NOTE — Telephone Encounter (Signed)
 Pt called this afternoon asking if she could be seen today around 235pm instead of Thursday as she was just down the road and her mom had an appt at 330. I told pt I could add her to the schedule today at 330 and she stated she was going to come on in and see if they could get her in sooner. I told her I could not promise they could get her in sooner. She stated she was on her way when I was discussing with Dr Lilian Kapur via secure chat.  She then called back at 334pm stating she had to get her mom to her 330 appt so she has left and wanted to be seen either tomorrow in [place] or Thursday at the original appt. I put her in the [place] office tomorrow at 915

## 2023-10-11 ENCOUNTER — Encounter: Payer: Self-pay | Admitting: Podiatry

## 2023-10-11 ENCOUNTER — Ambulatory Visit (INDEPENDENT_AMBULATORY_CARE_PROVIDER_SITE_OTHER): Admitting: Podiatry

## 2023-10-11 DIAGNOSIS — L259 Unspecified contact dermatitis, unspecified cause: Secondary | ICD-10-CM

## 2023-10-11 DIAGNOSIS — S92312D Displaced fracture of first metatarsal bone, left foot, subsequent encounter for fracture with routine healing: Secondary | ICD-10-CM

## 2023-10-11 MED ORDER — FLUOCINONIDE EMULSIFIED BASE 0.05 % EX CREA: 1.0000 | TOPICAL_CREAM | Freq: Two times a day (BID) | CUTANEOUS | 1 refills | Status: DC

## 2023-10-11 NOTE — Progress Notes (Signed)
    Subjective:  Patient ID: Erin Dennis, female    DOB: 1981-02-20,  MRN: [id number]  Chief Complaint  Patient presents with   Routine Post Op    POV #2, DOS 09/22/23, PLATE AND SCREW REMOVAL LEFT FOOT, POSSIBLE [place] PROCEDURE AND REVISION BUNION "It's okay.  I want him to take this pin out.  I can't deal with the anxiety.  My anxiety is out the roof.  I'm scared I'm going to bump it or snag it in my sleep.  The boot is breaking out my leg."   43 y.o. female returns for post-op check.  She returns today for follow-up and her CAM boot.  Her insurance would not cover a knee scooter so she has not purchased 1.  Feels like the boot is breaking out her leg again and causing a rash.  Review of Systems: Negative except as noted in the HPI. Denies N/V/F/Ch.   Objective:  There were no vitals filed for this visit. There is no height or weight on file to calculate BMI. Constitutional Well developed. Well nourished.  Vascular Foot warm and well perfused. Capillary refill normal to all digits.  Calf is soft and supple, no posterior calf or knee pain, negative Homans' sign  Neurologic Normal speech. Oriented to person, place, and time. Epicritic sensation to light touch grossly present bilaterally.  Dermatologic Incisions are well-healed and not hypertrophic  Orthopedic: Mild pain to palpation noted about the surgical site.  Mild edema.  Unchanged alignment of Kirschner wire.  No evidence of pin tract infection   Multiple view plain film radiographs: Unchanged alignment since immediate postop films, no complication of screw or K wire Assessment:   1. Contact dermatitis of lower leg   2. Closed displaced fracture of first metatarsal bone of left foot with routine healing, subsequent encounter    Plan:  Patient was evaluated and treated and all questions answered.  S/p foot surgery left -Unfortunate she is in a difficult situation due to noncompliance leading to fracture of the osteotomy  requiring this revision.  I discussed with her that the pin is absolutely necessary to maintain stability of the fracture to osteotomy to facilitate bone healing.  I discussed with the removal of this likely would lead to delayed malunion or nonunion which could leave her with permanent disability.  I advised her to continue to use the CAM boot I gave her the option of a below-knee cast as well but she declined this.  I do not think she would be compliant with cast nonweightbearing either and if she is going to be noncompliant then it is likely safe for her to be in the cam boot as opposed to walking on a cast.  She also did poorly with cast previously without surgeries.  I prescribed her fluocinonide cream, currently using triamcinolone which has been ineffective so a higher potency steroid is medically necessary to facilitate alleviation of the dermatitis.  She will return April 10 for new x-rays and pin removal and no sooner than this which will be 6 weeks from the revision surgery.  Regarding her anxiety she typically does not has anxiety she says, I encouraged her to discuss this with her PCP as I cannot treat her for the anxiety itself.  Return in about 29 days (around 11/09/2023) for post op (new x-rays), pin removal.

## 2023-10-12 ENCOUNTER — Telehealth: Payer: Self-pay

## 2023-10-12 ENCOUNTER — Encounter: Admitting: Podiatry

## 2023-10-12 ENCOUNTER — Encounter: Payer: Medicaid Other | Admitting: Podiatry

## 2023-10-12 NOTE — Telephone Encounter (Signed)
 PA request received from [facility] for Fluocinonide Emulsified Base 0.05% cream. PA submitted through covermymeds.  [employer] (Key: [id number]) Rx #: [id number]

## 2023-10-13 ENCOUNTER — Telehealth: Payer: Self-pay | Admitting: Podiatry

## 2023-10-13 DIAGNOSIS — Z9889 Other specified postprocedural states: Secondary | ICD-10-CM

## 2023-10-13 NOTE — Telephone Encounter (Signed)
 Patient called seeking advice regarding their post op course stating they have concerns about their swelling following revision [place] Bunionectomy.  He was in the office of Dr. Lilian Kapur, her surgeon 2 days ago and had her stitches removed and per documentation everything appeared appropriate within postoperative course.  Patient states that her throat is very swollen and she is very concerned about infection.  She remains very anxious about her percutaneous pin.  She also does admit that she had been on her foot a fair amount for her son's birthday, though she reports she was using in the CAM boot.   She denies any drainage, warmth or deep redness to the surgical site.  Assurances given to the patient that some swelling at this point in time is very typical following surgery.  She needs to remain compliant with nonweightbearing as this could impact the outcome.  Moreover, more pain and swelling would be expected with any weightbearing advised keeping foot elevated and applying ice.  Discussed signs of developing infection with patient.

## 2023-10-16 MED ORDER — TRAMADOL HCL 50 MG PO TABS: 50.0000 mg | ORAL_TABLET | Freq: Four times a day (QID) | ORAL | 0 refills | Status: AC | PRN

## 2023-10-16 MED ORDER — CEPHALEXIN 500 MG PO CAPS: 500.0000 mg | ORAL_CAPSULE | Freq: Three times a day (TID) | ORAL | 0 refills | Status: DC

## 2023-10-20 ENCOUNTER — Encounter: Payer: Self-pay | Admitting: Podiatry

## 2023-10-25 ENCOUNTER — Other Ambulatory Visit: Payer: Self-pay | Admitting: Podiatry

## 2023-10-26 ENCOUNTER — Telehealth: Payer: Self-pay | Admitting: Podiatry

## 2023-10-26 NOTE — Telephone Encounter (Signed)
 Pt had surgery 2/21 with Dr Lilian Kapur and has appt next Thursday to have wire removed but pt states it is itching, burning and a little brown drainage coming out. Please advise

## 2023-10-27 ENCOUNTER — Encounter (HOSPITAL_COMMUNITY): Payer: Self-pay

## 2023-10-27 ENCOUNTER — Encounter: Payer: Self-pay | Admitting: Podiatry

## 2023-10-27 ENCOUNTER — Ambulatory Visit: Admitting: Podiatry

## 2023-10-27 ENCOUNTER — Telehealth: Payer: Self-pay | Admitting: Podiatry

## 2023-10-27 ENCOUNTER — Other Ambulatory Visit: Payer: Self-pay

## 2023-10-27 ENCOUNTER — Emergency Department (HOSPITAL_COMMUNITY)
Admission: EM | Admit: 2023-10-27 | Discharge: 2023-10-27 | Disposition: A | Discharge status: Home / Self Care | Attending: Emergency Medicine | Admitting: Emergency Medicine

## 2023-10-27 DIAGNOSIS — M79672 Pain in left foot: Secondary | ICD-10-CM | POA: Diagnosis present

## 2023-10-27 NOTE — ED Notes (Signed)
 Discharge instructions discussed, pt refused DC paperwork. Ambulatory to ED waiting room with steady gait.

## 2023-10-27 NOTE — Discharge Instructions (Signed)
 Call your podiatrist on Monday to schedule a follow-up visit.  They recommend that you do not pull the pain of your toe as this could result in permanent disability

## 2023-10-27 NOTE — Telephone Encounter (Signed)
 Pt was called @ 12:23 pm  to see if she could arrive by 1:15 pm. She stated that she can be here in 45 minutes. Front desk was update of the add on and that she was coming from [place].  Pt showed up at the office around 1:45-1:50pm. She informed check-in that she told me it will take an hour and half for her to get her. If that ws the case, Pt would have been informed that it will be to late to arrive for she was being added on as an emergency and the office closed early.

## 2023-10-27 NOTE — Telephone Encounter (Signed)
 Dr. Ralene Cork if you can possibly contact Dr. Lorre Nick, He said he has been trying to reach out to a nurse and left a message regarding a patient it is urgent( Dr. Vara Guardian patient)

## 2023-10-27 NOTE — ED Provider Notes (Signed)
 [place] [facility] Provider Note   CSN: [id number] Arrival date & time: 10/27/23  1356     History  Chief Complaint  Patient presents with   Foot Pain    Erin Dennis is a 43 y.o. female.  43 year old female here complaining of left foot pain.  Patient went to her doctor today to have a postoperative pin removed but cannot get this done.  Has not had any fevers.  No purulent drainage.  Was told to come to the ED      Home Medications Prior to Admission medications   Medication Sig Start Date End Date Taking? Authorizing Provider  acitretin (SORIATANE) 10 MG capsule Take 1 capsule (10 mg total) by mouth daily before breakfast. 01/07/22   Lazaro Arms, MD  albuterol (VENTOLIN HFA) 108 (90 Base) MCG/ACT inhaler Inhale 1-2 puffs into the lungs every 6 (six) hours as needed. 09/06/23   [name], PA-C  cephALEXin (KEFLEX) 500 MG capsule Take 1 capsule (500 mg total) by mouth 3 (three) times daily. 10/16/23   McDonald, Rachelle Hora, DPM  fluocinonide-emollient (LIDEX-E) 0.05 % cream Apply 1 Application topically 2 (two) times daily. 10/11/23   McDonald, Rachelle Hora, DPM  gabapentin (NEURONTIN) 300 MG capsule TAKE 1 CAPSULE(300 MG) BY MOUTH THREE TIMES DAILY 10/25/23   Edwin Cap, DPM      Allergies    Acetaminophen, 5-alpha reductase inhibitors, Sulfamethoxazole-trimethoprim, and Tape    Review of Systems   Review of Systems  All other systems reviewed and are negative.   Physical Exam Updated Vital Signs BP (!) 163/104   Pulse 63   Temp 98.7 F (37.1 C)   Resp 18   Ht 1.549 m (5\' 1" )   Wt 106.6 kg   LMP  (LMP Unknown)   SpO2 100%   BMI 44.40 kg/m  Physical Exam Vitals and nursing note reviewed.  Constitutional:      General: She is not in acute distress.    Appearance: Normal appearance. She is well-developed. She is not toxic-appearing.  HENT:     Head: Normocephalic and atraumatic.  Eyes:     General: Lids are  normal.     Conjunctiva/sclera: Conjunctivae normal.     Pupils: Pupils are equal, round, and reactive to light.  Neck:     Thyroid: No thyroid mass.     Trachea: No tracheal deviation.  Cardiovascular:     Rate and Rhythm: Normal rate and regular rhythm.     Heart sounds: Normal heart sounds. No murmur heard.    No gallop.  Pulmonary:     Effort: Pulmonary effort is normal. No respiratory distress.     Breath sounds: Normal breath sounds. No stridor. No decreased breath sounds, wheezing, rhonchi or rales.  Abdominal:     General: There is no distension.     Palpations: Abdomen is soft.     Tenderness: There is no abdominal tenderness. There is no rebound.  Musculoskeletal:        General: No tenderness. Normal range of motion.     Cervical back: Normal range of motion and neck supple.       Legs:  Skin:    General: Skin is warm and dry.     Findings: No abrasion or rash.  Neurological:     Mental Status: She is alert and oriented to person, place, and time. Mental status is at baseline.     GCS: GCS eye subscore  is 4. GCS verbal subscore is 5. GCS motor subscore is 6.     Cranial Nerves: No cranial nerve deficit.     Sensory: No sensory deficit.     Motor: Motor function is intact.  Psychiatric:        Attention and Perception: Attention normal.        Speech: Speech normal.        Behavior: Behavior normal.    ED Results / Procedures / Treatments   Labs (all labs ordered are listed, but only abnormal results are displayed) Labs Reviewed - No data to display  EKG None  Radiology No results found.  Procedures Procedures    Medications Ordered in ED Medications - No data to display  ED Course/ Medical Decision Making/ A&P                                 Medical Decision Making  Had long discussion with Dr. Ralene Cork from podiatry.  She reviewed the patient's chart in the office and it was noted that the patient is not supposed to have the pin removed from her  foot until April 11 which is almost 2 weeks from now.  Patient very upset about this and demanded that the pin be removed.  Dr. Ralene Cork and the patient spoke directly on the phone in my presence.  This was also reiterated to the patient and she became very upset.  I offered her pain medication and she declined.  Will discharge        Final Clinical Impression(s) / ED Diagnoses Final diagnoses:  None    Rx / DC Orders ED Discharge Orders     None         Lorre Nick, MD 10/27/23 1514

## 2023-10-27 NOTE — ED Triage Notes (Addendum)
 Pt c/o left foot pain. Pt has rod in left great toe/foot and was supposed to see MD today. Pt was told to come to ED.

## 2023-10-27 NOTE — Telephone Encounter (Signed)
 Dr. Lorre Nick is on the line to speak with you or Dr, Lilian Kapur it's urgent

## 2023-10-28 ENCOUNTER — Encounter: Payer: Self-pay | Admitting: Podiatry

## 2023-10-28 ENCOUNTER — Other Ambulatory Visit: Payer: Self-pay | Admitting: Podiatry

## 2023-10-30 MED ORDER — OXYCODONE HCL 5 MG PO TABS: 5.0000 mg | ORAL_TABLET | Freq: Two times a day (BID) | ORAL | 0 refills | Status: AC | PRN

## 2023-10-30 MED ORDER — GABAPENTIN 300 MG PO CAPS: 300.0000 mg | ORAL_CAPSULE | Freq: Three times a day (TID) | ORAL | 0 refills | Status: DC

## 2023-10-30 NOTE — Addendum Note (Signed)
 Addended byLilian Kapur, Yaiza Palazzola R on: 10/30/2023 11:54 AM   Modules accepted: Orders

## 2023-11-02 ENCOUNTER — Encounter: Payer: Self-pay | Admitting: Podiatry

## 2023-11-02 ENCOUNTER — Ambulatory Visit (INDEPENDENT_AMBULATORY_CARE_PROVIDER_SITE_OTHER): Admitting: Podiatry

## 2023-11-02 ENCOUNTER — Encounter: Payer: Medicaid Other | Admitting: Podiatry

## 2023-11-02 ENCOUNTER — Ambulatory Visit (INDEPENDENT_AMBULATORY_CARE_PROVIDER_SITE_OTHER)

## 2023-11-02 DIAGNOSIS — S92312D Displaced fracture of first metatarsal bone, left foot, subsequent encounter for fracture with routine healing: Secondary | ICD-10-CM

## 2023-11-02 DIAGNOSIS — Z9889 Other specified postprocedural states: Secondary | ICD-10-CM

## 2023-11-02 NOTE — Progress Notes (Signed)
    Subjective:  Patient ID: Erin Dennis, female    DOB: [birth year],  MRN: [id number]  Chief Complaint  Patient presents with   Routine Post Op    DOS 09/22/23, PLATE AND SCREW REMOVAL LEFT FOOT, POSSIBLE [place] PROCEDURE AND REVISION BUNION( left foot pain    43 y.o. female returns for post-op check.  She returns today for follow-up ambulating in the cam boot.  She would like to have the pin removed today.  She is worried about the pin digging into the bottom of the toe where it hurts and her mother is having revision back surgery next week and is going to be taking care of her full-time.  The pain is interrupting sleep.  Review of Systems: Negative except as noted in the HPI. Denies N/V/F/Ch.   Objective:  There were no vitals filed for this visit. There is no height or weight on file to calculate BMI. Constitutional Well developed. Well nourished.  Vascular Foot warm and well perfused. Capillary refill normal to all digits.  Calf is soft and supple, no posterior calf or knee pain, negative Homans' sign  Neurologic Normal speech. Oriented to person, place, and time. Epicritic sensation to light touch grossly present bilaterally.  Dermatologic Incisions are well-healed and not hypertrophic  Orthopedic: Pain on the distal to the pin site plantar.  No signs of infection.   Multiple view plain film radiographs: Unchanged alignment there is early bone bridging and bone callus formation across the osteotomy, the Kirschner wires unchanged in length position Assessment:   1. Closed displaced fracture of first metatarsal bone of left foot with routine healing, subsequent encounter    Plan:  Patient was evaluated and treated and all questions answered.  S/p foot surgery left -We reviewed her x-rays.  There is secondary bone healing beginning but still has quite a ways to go.  I recommended that the Kirschner wire be left in for 2 more weeks prior to removal for stability.  She has been  noncompliant in using her knee scooter effectively and still states that she walks on the side of the foot at night without any boot on.  I discussed with her that if the pin is removed today where a higher risk of nonunion malunion and refracture of the osteotomy.  She states she understands the risk and wants to have it removed anyway today because of the anxiety is killing her.  At her request I removed the pen but discussed with her that she is to remain in the boot essentially 24/7 except for bathing and that it needs to be on at all times for any movement, even if this is only a short distance at night and it definitely has to be on the she is using the knee scooter in the event there is a fall.  I advised her to rest and stay off the foot is much as possible.  I will see her back in 2 weeks to reevaluate.  Return in about 2 weeks (around 11/16/2023) for post op (new x-rays).

## 2023-11-09 ENCOUNTER — Encounter: Admitting: Podiatry

## 2023-11-16 ENCOUNTER — Ambulatory Visit (INDEPENDENT_AMBULATORY_CARE_PROVIDER_SITE_OTHER)

## 2023-11-16 ENCOUNTER — Encounter: Payer: Self-pay | Admitting: Podiatry

## 2023-11-16 ENCOUNTER — Ambulatory Visit (INDEPENDENT_AMBULATORY_CARE_PROVIDER_SITE_OTHER): Admitting: Podiatry

## 2023-11-16 VITALS — Ht 61.0 in | Wt 235.0 lb

## 2023-11-16 DIAGNOSIS — S92312D Displaced fracture of first metatarsal bone, left foot, subsequent encounter for fracture with routine healing: Secondary | ICD-10-CM

## 2023-11-16 NOTE — Patient Instructions (Signed)
 Wear the surgical shoe until May 1 and then you can go back to a sneaker

## 2023-11-19 NOTE — Progress Notes (Signed)
    Subjective:  Patient ID: Erin Dennis, female    DOB: 1981-01-21,  MRN: [id number]  Chief Complaint  Patient presents with   Routine Post Op    Pt is here for routine post op visit 3 states her foot is fine and is ready to get out of the boot.    43 y.o. female returns for post-op check.  She returns today for follow-up ambulating in the cam boot.  Feels better after having the pin removed  Review of Systems: Negative except as noted in the HPI. Denies N/V/F/Ch.   Objective:  There were no vitals filed for this visit. Body mass index is 44.4 kg/m. Constitutional Well developed. Well nourished.  Vascular Foot warm and well perfused. Capillary refill normal to all digits.  Calf is soft and supple, no posterior calf or knee pain, negative Homans' sign  Neurologic Normal speech. Oriented to person, place, and time. Epicritic sensation to light touch grossly present bilaterally.  Dermatologic Incisions are well-healed and not hypertrophic  Orthopedic: Pain is improved.  Still present edema   Multiple view plain film radiographs: Unchanged alignment there is increasing bone callus formation Assessment:   1. Closed displaced fracture of first metatarsal bone of left foot with routine healing, subsequent encounter    Plan:  Patient was evaluated and treated and all questions answered.  S/p foot surgery left - Showing some improvement in bone callus formation.  She may transition to a surgical shoe.  No weightbearing outside of the surgical shoe at this point still.  Return in 6 weeks for new final radiographs.  Return in about 6 weeks (around 12/28/2023) for surgery follow up (new xrays).

## 2023-12-25 ENCOUNTER — Telehealth: Admitting: Physician Assistant

## 2023-12-25 DIAGNOSIS — L551 Sunburn of second degree: Secondary | ICD-10-CM

## 2023-12-25 MED ORDER — CEPHALEXIN 500 MG PO CAPS: 500.0000 mg | ORAL_CAPSULE | Freq: Two times a day (BID) | ORAL | 0 refills | Status: AC

## 2023-12-25 MED ORDER — PREDNISONE 20 MG PO TABS: 40.0000 mg | ORAL_TABLET | Freq: Every day | ORAL | 0 refills | Status: AC

## 2023-12-25 MED ORDER — SILVER SULFADIAZINE 1 % EX CREA: 1.0000 | TOPICAL_CREAM | Freq: Every day | CUTANEOUS | 0 refills | Status: AC

## 2023-12-25 NOTE — Patient Instructions (Signed)
  Erin Dennis, thank you for joining Hyla Maillard, PA-C for today's virtual visit.  While this provider is not your primary care provider (PCP), if your PCP is located in our provider database this encounter information will be shared with them immediately following your visit.   A [facility] MyChart account gives you access to today's visit and all your visits, tests, and labs performed at [facility] " click here if you don't have a [facility] MyChart account or go to [email or web address]  Consent: (Patient) Erin Dennis provided verbal consent for this virtual visit at the beginning of the encounter.  Current Medications:  Current Outpatient Medications:    acitretin  (SORIATANE ) 10 MG capsule, Take 1 capsule (10 mg total) by mouth daily before breakfast., Disp: 90 capsule, Rfl: 3   albuterol  (VENTOLIN  HFA) 108 (90 Base) MCG/ACT inhaler, Inhale 1-2 puffs into the lungs every 6 (six) hours as needed., Disp: 8 g, Rfl: 0   cephALEXin  (KEFLEX ) 500 MG capsule, Take 1 capsule (500 mg total) by mouth 3 (three) times daily., Disp: 30 capsule, Rfl: 0   fluocinonide -emollient (LIDEX -E) 0.05 % cream, Apply 1 Application topically 2 (two) times daily., Disp: 30 g, Rfl: 1   gabapentin  (NEURONTIN ) 300 MG capsule, Take 1 capsule (300 mg total) by mouth 3 (three) times daily., Disp: 90 capsule, Rfl: 0   Medications ordered in this encounter:  No orders of the defined types were placed in this encounter.    *If you need refills on other medications prior to your next appointment, please contact your pharmacy*  Follow-Up: Call back or seek an in-person evaluation if the symptoms worsen or if the condition fails to improve as anticipated.  [facility] [phone]  Other Instructions Please keep the skin clean and dry. Cool compresses as discussed. Take the prednisone  and antibiotic as directed. Apply the Silvadene  as discussed. Call your dermatologist  first thing in the morning to schedule follow-up appointment. You need an in person evaluation ASAP for any acutely worsening or nonresolving symptoms.   If you have been instructed to have an in-person evaluation today at a local Urgent Care facility, please use the link below. It will take you to a list of all of our available [facility], including address, phone number and hours of operation. Please do not delay care.  [facility]  If you or a family member do not have a primary care provider, use the link below to schedule a visit and establish care. When you choose a [facility] primary care physician or advanced practice provider, you gain a long-term partner in health. Find a Primary Care Provider  Learn more about [facility]'s in-office and virtual care options: [facility] - Get Care Now
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
## 2023-12-28 ENCOUNTER — Ambulatory Visit: Admitting: Podiatry

## 2023-12-28 ENCOUNTER — Encounter: Payer: Self-pay | Admitting: Podiatry

## 2023-12-28 MED ORDER — GABAPENTIN 300 MG PO CAPS: 300.0000 mg | ORAL_CAPSULE | Freq: Three times a day (TID) | ORAL | 0 refills | Status: DC

## 2024-01-18 ENCOUNTER — Ambulatory Visit: Admitting: Podiatry

## 2024-01-29 ENCOUNTER — Ambulatory Visit (INDEPENDENT_AMBULATORY_CARE_PROVIDER_SITE_OTHER): Admitting: Podiatry

## 2024-01-29 ENCOUNTER — Encounter: Payer: Self-pay | Admitting: Podiatry

## 2024-01-29 ENCOUNTER — Ambulatory Visit (INDEPENDENT_AMBULATORY_CARE_PROVIDER_SITE_OTHER)

## 2024-01-29 DIAGNOSIS — M2012 Hallux valgus (acquired), left foot: Secondary | ICD-10-CM

## 2024-01-29 NOTE — Progress Notes (Signed)
    Subjective:  Patient ID: Erin Dennis, female    DOB: Jan 10, 1981,  MRN: [id number]  Chief Complaint  Patient presents with   Routine Post Op    POV 4 DOS 09/22/23, PLATE AND SCREW REMOVAL LEFT FOOT, POSSIBLE [place] PROCEDURE AND REVISION BUNION It is doing great.    43 y.o. female returns for post-op check.  She returns today for follow-up ambulating in regular shoes.  Still taking gabapentin  at night occasionally pelvic it helps.  Otherwise doing well Review of Systems: Negative except as noted in the HPI. Denies N/V/F/Ch.   Objective:  There were no vitals filed for this visit. There is no height or weight on file to calculate BMI. Constitutional Well developed. Well nourished.  Vascular Foot warm and well perfused. Capillary refill normal to all digits.  Calf is soft and supple, no posterior calf or knee pain, negative Homans' sign  Neurologic Normal speech. Oriented to person, place, and time. Epicritic sensation to light touch grossly present bilaterally.  Dermatologic Incisions are well-healed and not hypertrophic  Orthopedic: Pain is improved.  No edema   Multiple view plain film radiographs: Good healing across the osteotomy site and fracture site Assessment:   1. Hallux abducto valgus, left    Plan:  Patient was evaluated and treated and all questions answered.  S/p foot surgery left - Improved quite a bit.  Near full union at this point although some strengthening and increased densities to happen at the bone callus site.  Weightbearing in shoes as tolerated.  She may utilize the gabapentin  as needed.  Refill was sent recently.  Follow-up with me as needed for this or other issues Return if symptoms worsen or fail to improve.

## 2024-02-06 ENCOUNTER — Ambulatory Visit: Admitting: Podiatry

## 2024-02-19 ENCOUNTER — Encounter: Payer: Self-pay | Admitting: Podiatry

## 2024-02-20 MED ORDER — GABAPENTIN 300 MG PO CAPS: 300.0000 mg | ORAL_CAPSULE | Freq: Three times a day (TID) | ORAL | 0 refills | Status: DC

## 2024-06-03 ENCOUNTER — Encounter: Payer: Self-pay | Admitting: Podiatry

## 2024-06-04 MED ORDER — GABAPENTIN 300 MG PO CAPS: 300.0000 mg | ORAL_CAPSULE | Freq: Three times a day (TID) | ORAL | 0 refills | Status: AC
